# Patient Record
Sex: Male | Born: 1965
Health system: Southern US, Community
[De-identification: ages and names within clinical notes are randomized; demographics above are authoritative.]

## PROBLEM LIST (undated history)

## (undated) DIAGNOSIS — Z7960 Long term (current) use of unspecified immunomodulators and immunosuppressants: Secondary | ICD-10-CM

## (undated) DIAGNOSIS — Z9889 Other specified postprocedural states: Secondary | ICD-10-CM

## (undated) DIAGNOSIS — E785 Hyperlipidemia, unspecified: Secondary | ICD-10-CM

## (undated) DIAGNOSIS — I2510 Atherosclerotic heart disease of native coronary artery without angina pectoris: Secondary | ICD-10-CM

## (undated) DIAGNOSIS — K219 Gastro-esophageal reflux disease without esophagitis: Secondary | ICD-10-CM

## (undated) DIAGNOSIS — R112 Nausea with vomiting, unspecified: Secondary | ICD-10-CM

## (undated) DIAGNOSIS — M810 Age-related osteoporosis without current pathological fracture: Secondary | ICD-10-CM

## (undated) DIAGNOSIS — D4989 Neoplasm of unspecified behavior of other specified sites: Secondary | ICD-10-CM

## (undated) DIAGNOSIS — D150 Benign neoplasm of thymus: Secondary | ICD-10-CM

## (undated) DIAGNOSIS — J324 Chronic pansinusitis: Secondary | ICD-10-CM

## (undated) DIAGNOSIS — Z79899 Other long term (current) drug therapy: Secondary | ICD-10-CM

## (undated) DIAGNOSIS — G7000 Myasthenia gravis without (acute) exacerbation: Secondary | ICD-10-CM

## (undated) DIAGNOSIS — G2581 Restless legs syndrome: Secondary | ICD-10-CM

## (undated) DIAGNOSIS — B009 Herpesviral infection, unspecified: Secondary | ICD-10-CM

## (undated) HISTORY — DX: Restless legs syndrome: G25.81

## (undated) HISTORY — DX: Neoplasm of unspecified behavior of other specified sites: D49.89

## (undated) HISTORY — DX: Myasthenia gravis without (acute) exacerbation: G70.00

## (undated) HISTORY — DX: Benign neoplasm of thymus: D15.0

## (undated) HISTORY — DX: Herpesviral infection, unspecified: B00.9

---

## 1993-07-29 DIAGNOSIS — Z9889 Other specified postprocedural states: Secondary | ICD-10-CM | POA: Insufficient documentation

## 1993-10-04 HISTORY — PX: TOTAL THYMECTOMY: SHX2546

## 2001-10-04 DIAGNOSIS — B009 Herpesviral infection, unspecified: Secondary | ICD-10-CM

## 2001-10-04 HISTORY — DX: Herpesviral infection, unspecified: B00.9

## 2004-04-01 ENCOUNTER — Emergency Department (HOSPITAL_COMMUNITY): Admission: EM | Admit: 2004-04-01 | Discharge: 2004-04-01 | Payer: Self-pay | Admitting: Emergency Medicine

## 2004-10-04 HISTORY — PX: STRABISMUS SURGERY: SHX218

## 2005-01-08 ENCOUNTER — Ambulatory Visit: Payer: Self-pay | Admitting: Physician Assistant

## 2006-02-03 ENCOUNTER — Ambulatory Visit: Payer: Self-pay | Admitting: Specialist

## 2006-10-04 HISTORY — PX: EYE MUSCLE SURGERY: SHX370

## 2010-07-29 ENCOUNTER — Ambulatory Visit: Payer: Self-pay | Admitting: Unknown Physician Specialty

## 2011-08-20 ENCOUNTER — Ambulatory Visit: Payer: Self-pay | Admitting: Psychiatry

## 2011-11-29 ENCOUNTER — Ambulatory Visit: Payer: Self-pay | Admitting: Unknown Physician Specialty

## 2011-12-03 HISTORY — PX: SEPTOPLASTY: SUR1290

## 2011-12-07 ENCOUNTER — Ambulatory Visit: Payer: Self-pay | Admitting: Unknown Physician Specialty

## 2012-01-18 LAB — PULMONARY FUNCTION TEST

## 2012-01-19 ENCOUNTER — Ambulatory Visit: Payer: Self-pay | Admitting: Oncology

## 2012-01-19 ENCOUNTER — Ambulatory Visit: Payer: Self-pay | Admitting: Internal Medicine

## 2012-01-19 LAB — CBC CANCER CENTER
Bands: 4 %
Basophil #: 0 x10 3/mm (ref 0.0–0.1)
Basophil %: 0.5 %
Eosinophil #: 0 x10 3/mm (ref 0.0–0.7)
Eosinophil %: 0.4 %
Eosinophil: 1 %
HCT: 43.9 % (ref 40.0–52.0)
HGB: 13.9 g/dL (ref 13.0–18.0)
Lymphocyte #: 0.7 x10 3/mm — ABNORMAL LOW (ref 1.0–3.6)
Lymphocyte %: 7.3 %
Lymphocytes: 7 %
MCH: 26.6 pg (ref 26.0–34.0)
MCHC: 31.6 g/dL — ABNORMAL LOW (ref 32.0–36.0)
MCV: 84 fL (ref 80–100)
Monocyte #: 0.3 x10 3/mm (ref 0.2–1.0)
Monocyte %: 3.6 %
Monocytes: 3 %
Neutrophil #: 7.9 x10 3/mm — ABNORMAL HIGH (ref 1.4–6.5)
Neutrophil %: 88.2 %
Platelet: 356 x10 3/mm (ref 150–440)
RBC: 5.21 10*6/uL (ref 4.40–5.90)
RDW: 13.8 % (ref 11.5–14.5)
Segmented Neutrophils: 85 %
WBC: 9 x10 3/mm (ref 3.8–10.6)

## 2012-01-19 LAB — IRON AND TIBC
Iron Bind.Cap.(Total): 327 ug/dL (ref 250–450)
Iron Saturation: 21 %
Iron: 70 ug/dL (ref 65–175)
Unbound Iron-Bind.Cap.: 257 ug/dL

## 2012-01-19 LAB — HEPATIC FUNCTION PANEL A (ARMC)
Albumin: 3.7 g/dL (ref 3.4–5.0)
Alkaline Phosphatase: 69 U/L (ref 50–136)
Bilirubin, Direct: <0.1 mg/dL (ref 0.00–0.20)
Bilirubin,Total: 0.3 mg/dL (ref 0.2–1.0)
SGOT(AST): 20 U/L (ref 15–37)
SGPT (ALT): 24 U/L
Total Protein: 7.1 g/dL (ref 6.4–8.2)

## 2012-01-19 LAB — FOLATE: Folic Acid: 20.7 ng/mL (ref 3.1–100.0)

## 2012-01-19 LAB — CREATININE, SERUM
Creatinine: 0.84 mg/dL (ref 0.60–1.30)
EGFR (African American): >60
EGFR (Non-African Amer.): >60

## 2012-01-19 LAB — SEDIMENTATION RATE: Erythrocyte Sed Rate: 5 mm/hr (ref 0–15)

## 2012-01-19 LAB — LACTATE DEHYDROGENASE: LDH: 145 U/L (ref 87–241)

## 2012-02-02 ENCOUNTER — Ambulatory Visit: Payer: Self-pay | Admitting: Internal Medicine

## 2012-02-02 ENCOUNTER — Ambulatory Visit: Payer: Self-pay | Admitting: Oncology

## 2012-03-04 ENCOUNTER — Ambulatory Visit: Payer: Self-pay | Admitting: Internal Medicine

## 2012-03-04 ENCOUNTER — Ambulatory Visit: Payer: Self-pay | Admitting: Oncology

## 2012-04-04 ENCOUNTER — Ambulatory Visit: Payer: Self-pay | Admitting: Unknown Physician Specialty

## 2012-04-10 ENCOUNTER — Ambulatory Visit: Payer: Self-pay | Admitting: Internal Medicine

## 2012-04-26 ENCOUNTER — Ambulatory Visit (INDEPENDENT_AMBULATORY_CARE_PROVIDER_SITE_OTHER): Payer: 59 | Admitting: Internal Medicine

## 2012-04-26 ENCOUNTER — Encounter: Payer: Self-pay | Admitting: Internal Medicine

## 2012-04-26 VITALS — BP 102/64 | HR 70 | Temp 98.4°F | Resp 16 | Ht 66.0 in | Wt 148.2 lb

## 2012-04-26 DIAGNOSIS — G7000 Myasthenia gravis without (acute) exacerbation: Secondary | ICD-10-CM | POA: Insufficient documentation

## 2012-04-26 DIAGNOSIS — R059 Cough, unspecified: Secondary | ICD-10-CM

## 2012-04-26 DIAGNOSIS — R05 Cough: Secondary | ICD-10-CM

## 2012-04-26 DIAGNOSIS — R058 Other specified cough: Secondary | ICD-10-CM

## 2012-04-26 DIAGNOSIS — D4989 Neoplasm of unspecified behavior of other specified sites: Secondary | ICD-10-CM | POA: Insufficient documentation

## 2012-04-26 DIAGNOSIS — G2581 Restless legs syndrome: Secondary | ICD-10-CM | POA: Insufficient documentation

## 2012-04-26 MED ORDER — IPRATROPIUM BROMIDE 0.06 % NA SOLN: 2.0000 | Freq: Four times a day (QID) | NASAL | Status: DC

## 2012-04-26 NOTE — Progress Notes (Signed)
Patient ID: Frank Cabrera, male   DOB: 06-08-1966, 46 y.o.   MRN: [id number]  Patient Active Problem List  Diagnosis  . Cough with sputum    Subjective:  CC:   Chief Complaint  Patient presents with  . New Patient    HPI:   Frank Cabrera is a 46 y.o. male who presents as a new patient to establish primary care with the chief complaint of Cough for  One year.  Work in injection molding ,  Does not wear a mask.  No prior tobacco history , no travel .  Travel to [place] but mostly cities , last trip 2 years.  Ha shad chest x ray by Dr. Meredeth Ide and PFTS 2 months ago but no CT scan.  PFTS were reportedly normal except for  one element was borderline low. Cough is worse late at night and early in the morning. Throat makes a gurgling sound .   Fleming started Advair ,  Noticed a slight improvement for 2 weeks,  But has returned and feels he is wheezing all the time.  Varies in intensity worse when he lies down . taking omeprazole  Twice daily  20 am 40 q PM,  The heartburn is better but the coughing is still present.  ENT.  March  eval did not see reflux Erline Hau)  No change despite deviated septum surgery for sinus infections, occurring twice yearly .  Allergy testing was done Mirna Mires was negative .  Skin tests were negative bc he is histamine response did not react appropriately.  Was not on an anthistamine at the time .  History of myasthenia gravis,  Diagnosed 20 yrs ago Managed with prednisone and mycophenolate  15 mg daily,  Dr. Clarisa Kindred at [facility].  pramiprexole for RLS,  UGI/Barium swallow orderdd by Mechele Collin was positive for reflux, has not had any followup since.  Has not had a  hypersensitivity profile or a sputum culture  Past Medical History  Diagnosis Date  . Myasthenia gravis associated with thymoma   . Restless leg syndrome     Past Surgical History  Procedure Date  . Total thymectomy 1995  . Eye muscle surgery 2008    for strabismus, [facility],  Board  . Septoplasty March 2013     History reviewed. No pertinent family history.  History   Social History  . Marital Status: Married    Spouse Name: N/A    Number of Children: N/A  . Years of Education: N/A   Occupational History  . Not on file.   Social History Main Topics  . Smoking status: Never Smoker   . Smokeless tobacco: Never Used  . Alcohol Use: No  . Drug Use: No  . Sexually Active: Not on file   Other Topics Concern  . Not on file   Social History Narrative  . No narrative on file         @ALLHX @    Review of Systems:   The remainder of the review of systems was negative except those addressed in the HPI.       Objective:  BP 102/64  Pulse 70  Temp 98.4 F (36.9 C) (Oral)  Resp 16  Ht 5\' 6"  (1.676 m)  Wt 148 lb 4 oz (67.246 kg)  BMI 23.93 kg/m2  SpO2 96%  General appearance: alert, cooperative and appears stated age Ears: normal TM's and external ear canals both ears Throat: lips, mucosa, and tongue normal; teeth and gums normal Neck:  no adenopathy, no carotid bruit, supple, symmetrical, trachea midline and thyroid not enlarged, symmetric, no tenderness/mass/nodules Back: symmetric, no curvature. ROM normal. No CVA tenderness. Lungs: clear to auscultation bilaterally Heart: regular rate and rhythm, S1, S2 normal, no murmur, click, rub or gallop Abdomen: soft, non-tender; bowel sounds normal; no masses,  no organomegaly Pulses: 2+ and symmetric Skin: Skin color, texture, turgor normal. No rashes or lesions Lymph nodes: Cervical, supraclavicular, and axillary nodes normal.  Assessment and Plan:  Cough with sputum Etiology unclear and may be multifactorial including PND, acid reflux, eiosinophilic esophagitis, laryngela penetration from MG, and ILD from environmental exposure. .  Trial of Atrovent spray, obtain old records., consider hi rest CT , hypersensitivity profile, sputum culture, and swallow eval.   Updated Medication List Outpatient Encounter  Prescriptions as of 04/26/2012  Medication Sig Dispense Refill  . calcium carbonate (OS-CAL) 600 MG TABS Take 300 mg by mouth daily.      . ferrous gluconate (FERGON) 324 MG tablet Take 324 mg by mouth 3 (three) times daily with meals.      . Flaxseed, Linseed, (FLAXSEED OIL) 1000 MG CAPS Take by mouth daily.      . fluticasone-salmeterol (ADVAIR HFA) 115-21 MCG/ACT inhaler Inhale 2 puffs into the lungs 2 (two) times daily.      . folic acid (FOLVITE) 1 MG tablet Take 1 mg by mouth daily.      Marland Kitchen ipratropium (ATROVENT) 0.06 % nasal spray Place 2 sprays into the nose 4 (four) times daily.  15 mL  12  . methotrexate (RHEUMATREX) 15 MG tablet Take 15 mg by mouth once a week. Caution: Chemotherapy. Protect from light.      . Misc Natural Products (OSTEO BI-FLEX JOINT SHIELD PO) Take by mouth.      . Multiple Vitamin (MULTIVITAMIN) tablet Take 1 tablet by mouth daily.      . mycophenolate (CELLCEPT) 500 MG tablet Take 0.5 g by mouth 2 (two) times daily.      Marland Kitchen omeprazole (PRILOSEC) 20 MG capsule Take 20 mg by mouth daily.      . Pramipexole Dihydrochloride 0.375 MG TB24 Take by mouth at bedtime.      . predniSONE (DELTASONE) 10 MG tablet Take 15 mg by mouth daily.      . Turmeric 500 MG CAPS Take by mouth.         No orders of the defined types were placed in this encounter.    No Follow-up on file.

## 2012-04-26 NOTE — Assessment & Plan Note (Signed)
Etiology unclear and may be multifactorial including PND, acid reflux, eiosinophilic esophagitis, laryngela penetration from MG, and ILD from environmental exposure. .  Trial of Atrovent spray, obtain old records., consider hi rest CT , hypersensitivity profile, sputum culture, and swallow eval.

## 2012-05-05 ENCOUNTER — Encounter: Payer: Self-pay | Admitting: Internal Medicine

## 2012-05-30 ENCOUNTER — Telehealth: Payer: Self-pay | Admitting: Internal Medicine

## 2012-05-30 NOTE — Telephone Encounter (Signed)
Caller: [name]/Patient; Patient Name: Frank Cabrera; PCP: Duncan Dull (Adults only); Best Callback Phone Number: [phone]; Call regarding Fever, Nausea, Fatigue, Gassy, onset 8-24.  Temp ranging 99 to 100.  Advil taken prior to call.  All emergent symptoms ruled out per Fatigue Protocol, see wthin 24 hours. Appointment scheduled on 8-28 at 11:30 due to presistent fatigue that came on suddenly without known cause.  Patient verbalized understanding.

## 2012-05-31 ENCOUNTER — Ambulatory Visit (INDEPENDENT_AMBULATORY_CARE_PROVIDER_SITE_OTHER)
Admission: RE | Admit: 2012-05-31 | Discharge: 2012-05-31 | Disposition: A | Discharge status: Home / Self Care | Payer: 59 | Source: Ambulatory Visit | Attending: Internal Medicine | Admitting: Internal Medicine

## 2012-05-31 ENCOUNTER — Ambulatory Visit (INDEPENDENT_AMBULATORY_CARE_PROVIDER_SITE_OTHER): Payer: 59 | Admitting: Internal Medicine

## 2012-05-31 ENCOUNTER — Encounter: Payer: Self-pay | Admitting: Internal Medicine

## 2012-05-31 VITALS — BP 94/66 | HR 82 | Temp 98.6°F | Resp 14 | Wt 147.2 lb

## 2012-05-31 DIAGNOSIS — H9012 Conductive hearing loss, unilateral, left ear, with unrestricted hearing on the contralateral side: Secondary | ICD-10-CM

## 2012-05-31 DIAGNOSIS — H902 Conductive hearing loss, unspecified: Secondary | ICD-10-CM

## 2012-05-31 DIAGNOSIS — R059 Cough, unspecified: Secondary | ICD-10-CM

## 2012-05-31 DIAGNOSIS — J328 Other chronic sinusitis: Secondary | ICD-10-CM

## 2012-05-31 DIAGNOSIS — R058 Other specified cough: Secondary | ICD-10-CM

## 2012-05-31 DIAGNOSIS — R509 Fever, unspecified: Secondary | ICD-10-CM

## 2012-05-31 DIAGNOSIS — K219 Gastro-esophageal reflux disease without esophagitis: Secondary | ICD-10-CM

## 2012-05-31 DIAGNOSIS — IMO0001 Reserved for inherently not codable concepts without codable children: Secondary | ICD-10-CM

## 2012-05-31 DIAGNOSIS — J324 Chronic pansinusitis: Secondary | ICD-10-CM

## 2012-05-31 DIAGNOSIS — R053 Chronic cough: Secondary | ICD-10-CM

## 2012-05-31 DIAGNOSIS — R05 Cough: Secondary | ICD-10-CM

## 2012-05-31 DIAGNOSIS — R091 Pleurisy: Secondary | ICD-10-CM

## 2012-05-31 DIAGNOSIS — J929 Pleural plaque without asbestos: Secondary | ICD-10-CM

## 2012-05-31 LAB — CBC WITH DIFFERENTIAL/PLATELET
Basophils Absolute: 0 K/uL (ref 0.0–0.1)
Basophils Relative: 0.3 % (ref 0.0–3.0)
Eosinophils Absolute: 0 K/uL (ref 0.0–0.7)
Eosinophils Relative: 0.4 % (ref 0.0–5.0)
HCT: 43.9 % (ref 39.0–52.0)
Hemoglobin: 14.4 g/dL (ref 13.0–17.0)
Lymphocytes Relative: 6.9 % — ABNORMAL LOW (ref 12.0–46.0)
Lymphs Abs: 0.7 K/uL (ref 0.7–4.0)
MCHC: 32.8 g/dL (ref 30.0–36.0)
MCV: 85.2 fl (ref 78.0–100.0)
Monocytes Absolute: 0.4 K/uL (ref 0.1–1.0)
Monocytes Relative: 4.3 % (ref 3.0–12.0)
Neutro Abs: 8.6 K/uL — ABNORMAL HIGH (ref 1.4–7.7)
Neutrophils Relative %: 88.1 % — ABNORMAL HIGH (ref 43.0–77.0)
Platelets: 310 K/uL (ref 150.0–400.0)
RBC: 5.16 Mil/uL (ref 4.22–5.81)
RDW: 14.3 % (ref 11.5–14.6)
WBC: 9.8 K/uL (ref 4.5–10.5)

## 2012-05-31 NOTE — Progress Notes (Signed)
Patient ID: Frank Cabrera, male   DOB: [birth year], 46 y.o.   MRN: [id number]   Patient Active Problem List  Diagnosis  . Cough with sputum  . Myasthenia gravis associated with thymoma  . Restless leg syndrome  . Chronic cough  . Reflux  . Pansinusitis  . Conductive hearing loss in left ear  . Herpes simplex type II infection    Subjective:  CC:   Chief Complaint  Patient presents with  . Fever  . Nausea    HPI:   Frank Stehlin Smithis a 46 y.o. male who presents  Past Medical History  Diagnosis Date  . Myasthenia gravis associated with thymoma   . Restless leg syndrome   . Herpes simplex type II infection 2003    Past Surgical History  Procedure Date  . Total thymectomy 1995  . Eye muscle surgery 2008    for strabismus, [facility],  Board  . Septoplasty March 2013  . Strabismus surgery 2006         The following portions of the patient's history were reviewed and updated as appropriate: Allergies, current medications, and problem list.    Review of Systems:   12 Pt  review of systems was negative except those addressed in the HPI,     History   Social History  . Marital Status: Married    Spouse Name: N/A    Number of Children: N/A  . Years of Education: N/A   Occupational History  . Not on file.   Social History Main Topics  . Smoking status: Never Smoker   . Smokeless tobacco: Never Used  . Alcohol Use: No  . Drug Use: No  . Sexually Active: Not on file   Other Topics Concern  . Not on file   Social History Narrative  . No narrative on file    Objective:  BP 94/66  Pulse 82  Temp 98.6 F (37 C) (Oral)  Resp 14  Wt 147 lb 4 oz (66.792 kg)  SpO2 95%  General appearance: alert, cooperative and appears stated age Ears: normal TM's and external ear canals both ears Throat: lips, mucosa, and tongue normal; teeth and gums normal Neck: no adenopathy, no carotid bruit, supple, symmetrical, trachea midline and thyroid not enlarged,  symmetric, no tenderness/mass/nodules Back: symmetric, no curvature. ROM normal. No CVA tenderness. Lungs:  Scattered ronchi in all lung fields. No wheezing Heart: regular rate and rhythm, S1, S2 normal, no murmur, click, rub or gallop Abdomen: soft, non-tender; bowel sounds normal; no masses,  no organomegaly Pulses: 2+ and symmetric Skin: Skin color, texture, turgor normal. No rashes or lesions Lymph nodes: Cervical, supraclavicular, and axillary nodes normal.  Assessment and Plan:  Cough with sputum His current cough is concerning for an infectious etiology given his recent trip to [place]. I have ordered a chest x-ray and a CBC. His CBC had a normal white count but did have a left shift. His chest x-ray was normal except for pleural apical thickening bilaterally. Given his low-grade fevers and (temp of 100.0) and nonpurulent sputum I have elected to avoid antibiotics as such he has been on several courses of antibiotics over the last several months and I do not want to increase his risk for C. difficile. However I have ordered a chest CT with contrast to further evaluate his pleural apical thickening.  Chronic cough He has had a multidisciplinary approach to his chronic cough including GI,  ENT,  and pulmonary evaluations. He had a normal  laryngoscopy in August 2011. He had symptoms of reflux which is improved with Nexium in 2011. He had pansinusitis by CT scan in December 2012. He has a history of myasthenia gravis and is status post thymectomy  in 1995 and deviated septum surgery in March 2013 with minimal improvement in symptoms . He is taking omeprazole 40 mg twice daily for presumed reflux. He was given Advair and albuterol  by Dr. Meredeth Ide but has stopped the Advair as there was no difference in his symptoms.  in July Reglan was added along with Mucinex. He has had pulmonary function tests which noted mild obstruction and is scheduled to see Dr. Meredeth Ide at the end of the month for repeat  testing.  Reflux By report status post GI evaluation by Dr. Mechele Collin. Continue omeprazole 40 mg twice a day a  Pansinusitis Treated by [facility] within the buttocks followed by deviated septum surgery in March 2013. Persistent symptoms have necessitated repeat antibiotics with clindamycin for an additional 3 weeks in may , myringotomy and tympanostomy on the left in June and allergy testing in July which was apparently inconclusive.   Conductive hearing loss in left ear Confirmed with audiometrydone  May 2013, secondary to chronic serous otitis media which has now resolved with myringotomy and tympanostomy.   Updated Medication List Outpatient Encounter Prescriptions as of 05/31/2012  Medication Sig Dispense Refill  . calcium carbonate (OS-CAL) 600 MG TABS Take 300 mg by mouth daily.      . ferrous gluconate (FERGON) 324 MG tablet Take 324 mg by mouth 3 (three) times daily with meals.      . Flaxseed, Linseed, (FLAXSEED OIL) 1000 MG CAPS Take by mouth daily.      . fluticasone-salmeterol (ADVAIR HFA) 115-21 MCG/ACT inhaler Inhale 2 puffs into the lungs 2 (two) times daily.      . folic acid (FOLVITE) 1 MG tablet Take 1 mg by mouth daily.      Marland Kitchen ipratropium (ATROVENT) 0.06 % nasal spray Place 2 sprays into the nose 4 (four) times daily.  15 mL  12  . methotrexate (RHEUMATREX) 15 MG tablet Take 20 mg by mouth once a week. Caution: Chemotherapy. Protect from light.      . Misc Natural Products (OSTEO BI-FLEX JOINT SHIELD PO) Take by mouth.      . Multiple Vitamin (MULTIVITAMIN) tablet Take 1 tablet by mouth daily.      Marland Kitchen omeprazole (PRILOSEC) 20 MG capsule Take 20 mg by mouth daily.      . Pramipexole Dihydrochloride 0.375 MG TB24 Take by mouth at bedtime.      . predniSONE (DELTASONE) 10 MG tablet Take 15 mg by mouth daily.      . Turmeric 500 MG CAPS Take by mouth.      . DISCONTD: mycophenolate (CELLCEPT) 500 MG tablet Take 0.5 g by mouth 2 (two) times daily.         Orders Placed This  Encounter  Procedures  . DG Chest 2 View  . CT Chest W Contrast  . CBC with Differential    No Follow-up on file.

## 2012-06-01 ENCOUNTER — Encounter: Payer: Self-pay | Admitting: Internal Medicine

## 2012-06-01 DIAGNOSIS — J324 Chronic pansinusitis: Secondary | ICD-10-CM | POA: Insufficient documentation

## 2012-06-01 DIAGNOSIS — H9012 Conductive hearing loss, unilateral, left ear, with unrestricted hearing on the contralateral side: Secondary | ICD-10-CM | POA: Insufficient documentation

## 2012-06-01 DIAGNOSIS — IMO0001 Reserved for inherently not codable concepts without codable children: Secondary | ICD-10-CM | POA: Insufficient documentation

## 2012-06-01 DIAGNOSIS — R053 Chronic cough: Secondary | ICD-10-CM | POA: Insufficient documentation

## 2012-06-01 DIAGNOSIS — R05 Cough: Secondary | ICD-10-CM | POA: Insufficient documentation

## 2012-06-01 DIAGNOSIS — B009 Herpesviral infection, unspecified: Secondary | ICD-10-CM | POA: Insufficient documentation

## 2012-06-01 NOTE — Assessment & Plan Note (Signed)
By report status post GI evaluation by Dr. Mechele Collin. Continue omeprazole 40 mg twice a day a

## 2012-06-01 NOTE — Assessment & Plan Note (Signed)
He has had a multidisciplinary approach to his chronic cough including GI,  ENT,  and pulmonary evaluations. He had a normal laryngoscopy in August 2011. He had symptoms of reflux which is improved with Nexium in 2011. He had pansinusitis by CT scan in December 2012. He has a history of myasthenia gravis and is status post thymectomy  in 1995 and deviated septum surgery in March 2013 with minimal improvement in symptoms . He is taking omeprazole 40 mg twice daily for presumed reflux. He was given Advair and albuterol  by Dr. Meredeth Ide but has stopped the Advair as there was no difference in his symptoms.  in July Reglan was added along with Mucinex. He has had pulmonary function tests which noted mild obstruction and is scheduled to see Dr. Meredeth Ide at the end of the month for repeat testing.

## 2012-06-01 NOTE — Assessment & Plan Note (Signed)
Treated by [facility] within the buttocks followed by deviated septum surgery in March 2013. Persistent symptoms have necessitated repeat antibiotics with clindamycin for an additional 3 weeks in may , myringotomy and tympanostomy on the left in June and allergy testing in July which was apparently inconclusive.

## 2012-06-01 NOTE — Assessment & Plan Note (Signed)
His current cough is concerning for an infectious etiology given his recent trip to [place]. I have ordered a chest x-ray and a CBC. His CBC had a normal white count but did have a left shift. His chest x-ray was normal except for pleural apical thickening bilaterally. Given his low-grade fevers and (temp of 100.0) and nonpurulent sputum I have elected to avoid antibiotics as such he has been on several courses of antibiotics over the last several months and I do not want to increase his risk for C. difficile. However I have ordered a chest CT with contrast to further evaluate his pleural apical thickening.

## 2012-06-01 NOTE — Assessment & Plan Note (Signed)
Confirmed with audiometrydone  May 2013, secondary to chronic serous otitis media which has now resolved with myringotomy and tympanostomy.

## 2012-06-07 ENCOUNTER — Encounter: Payer: Self-pay | Admitting: Internal Medicine

## 2012-06-14 ENCOUNTER — Ambulatory Visit: Payer: Self-pay | Admitting: Internal Medicine

## 2012-06-14 ENCOUNTER — Telehealth: Payer: Self-pay | Admitting: Internal Medicine

## 2012-06-14 DIAGNOSIS — R059 Cough, unspecified: Secondary | ICD-10-CM

## 2012-06-14 DIAGNOSIS — R05 Cough: Secondary | ICD-10-CM

## 2012-06-14 NOTE — Telephone Encounter (Signed)
Angel at [facility] called and stated the patient has a contraindication with IV contrast dye, so the radiologist wants the CT done without contrast.  She needs a new order and a call to reschedule the appt for the same time just without contrast.  Please advise.

## 2012-06-14 NOTE — Telephone Encounter (Signed)
Ct reordered on and p[rinter

## 2012-06-15 ENCOUNTER — Telehealth: Payer: Self-pay

## 2012-06-15 ENCOUNTER — Telehealth: Payer: Self-pay | Admitting: Internal Medicine

## 2012-06-15 NOTE — Telephone Encounter (Signed)
Frank Cabrera at Dr Verdie Shire' office left v/m requesting copy of CXR done on 05/31/12 faxed to Dr Meredeth Ide fax # [phone]. Pt cancelled CXR ordered by Dr Meredeth Ide.Please advise.

## 2012-06-16 ENCOUNTER — Encounter: Payer: Self-pay | Admitting: Internal Medicine

## 2012-06-16 DIAGNOSIS — R05 Cough: Secondary | ICD-10-CM

## 2012-06-16 DIAGNOSIS — R059 Cough, unspecified: Secondary | ICD-10-CM

## 2012-06-16 NOTE — Telephone Encounter (Signed)
Left message on machine for [facility] at [facility]

## 2012-06-16 NOTE — Telephone Encounter (Signed)
The X ray was done at [facility] , so we will need to request that a copy of the actual filmbe sent to Dr. Meredeth Ide .  Please start that process.

## 2012-06-23 ENCOUNTER — Encounter: Payer: Self-pay | Admitting: Internal Medicine

## 2012-07-10 DIAGNOSIS — K219 Gastro-esophageal reflux disease without esophagitis: Secondary | ICD-10-CM | POA: Insufficient documentation

## 2012-08-01 ENCOUNTER — Ambulatory Visit (INDEPENDENT_AMBULATORY_CARE_PROVIDER_SITE_OTHER): Payer: 59 | Admitting: Pulmonary Disease

## 2012-08-01 ENCOUNTER — Encounter: Payer: Self-pay | Admitting: Pulmonary Disease

## 2012-08-01 VITALS — BP 90/60 | HR 83 | Temp 98.3°F | Ht 66.0 in | Wt 152.0 lb

## 2012-08-01 DIAGNOSIS — R053 Chronic cough: Secondary | ICD-10-CM

## 2012-08-01 DIAGNOSIS — K219 Gastro-esophageal reflux disease without esophagitis: Secondary | ICD-10-CM

## 2012-08-01 DIAGNOSIS — R0602 Shortness of breath: Secondary | ICD-10-CM

## 2012-08-01 DIAGNOSIS — IMO0001 Reserved for inherently not codable concepts without codable children: Secondary | ICD-10-CM

## 2012-08-01 DIAGNOSIS — J45909 Unspecified asthma, uncomplicated: Secondary | ICD-10-CM | POA: Insufficient documentation

## 2012-08-01 DIAGNOSIS — R05 Cough: Secondary | ICD-10-CM

## 2012-08-01 DIAGNOSIS — R059 Cough, unspecified: Secondary | ICD-10-CM

## 2012-08-01 MED ORDER — ALBUTEROL SULFATE HFA 108 (90 BASE) MCG/ACT IN AERS: 2.0000 | INHALATION_SPRAY | Freq: Four times a day (QID) | RESPIRATORY_TRACT | Status: DC | PRN

## 2012-08-01 MED ORDER — BECLOMETHASONE DIPROPIONATE 80 MCG/ACT IN AERS: 1.0000 | INHALATION_SPRAY | Freq: Two times a day (BID) | RESPIRATORY_TRACT | Status: DC

## 2012-08-01 NOTE — Assessment & Plan Note (Signed)
I think that this is likely secondary to his reflux and his asthma. See recommendations as above.

## 2012-08-01 NOTE — Assessment & Plan Note (Addendum)
Frank Cabrera has fairly significant symptoms and by my review of his CT scan I think he likely has chronic aspiration into the right lower lobe.  Plan: -Follow a gastroesophageal reflux disease diet recommendations -Start omeprazole daily -If no improvement in symptoms then we'll refer back to Dr. Mechele Collin for a pH probe placement.

## 2012-08-01 NOTE — Assessment & Plan Note (Addendum)
Based on Frank Cabrera's history of intermittent shortness of breath, cough and wheezing I think that asthma is likely. He states that once during childhood he had an episode of shortness of breath and wheezing and he has noticed it more frequently in the last several years. About 1 in for adult males with asthma will develop the disease and adulthood so though albeit a little bit unusual I think that this is likely asthma. His airway obstruction on pulmonary function testing supports this.  I think that it is also likely that his gastroesophageal reflux disease is contributing. Frank Cabrera has a very interesting CT of his chest which shows a focal area of emphysema in the right lower lung with associated tree in bud opacities. This seems consistent with chronic aspiration which certainly is going to make his asthma worse. He recently saw a gastroenterologist who recommended placing a 24-hour pH probe which I think is reasonable if not performing esophageal impedance testing.  Interestingly Frank Cabrera's symptoms have improved since he started on methotrexate. Though not indicated per current asthma care guidelines, methotrexate was once used by some providers for severe refractory asthma.  Plan: -Start Qvar 80 mcg 1 puff twice a day -Follow gastroesophageal reflux diet closely -Start PPI daily (he has omeprazole at home which I've encouraged him to continue taking) -If no improvement in intermittent cough and shortness of breath then would proceed with 24-hour pH probe testing and evaluation for a Nissen fundoplication if indicated -Followup with [place] in 3 months.

## 2012-08-01 NOTE — Patient Instructions (Addendum)
Stop your other inhalers Start QVar one puff twice a day Use the albuterol inhaler 2 puffs every four hours as needed for shortness of breath Follow the acid reflux diet recommendations we gave you Take your stomach pill every day  We will see you back in 2-3 months or sooner if needed

## 2012-08-01 NOTE — Progress Notes (Signed)
Subjective:    Patient ID: Frank Cabrera, male    DOB: 02/12/1966, 46 y.o.   MRN: [id number]  HPI  This is a 46 year old gentleman with myasthenia gravis who is referred to our clinic for a second opinion on asthma. As a child he says he remembers on at least one occasion having a severe episode of shortness of breath and wheezing with associated cough. He was told at that time that he had asthma but he never sought medical care for this. On various occasions over the years as an adult he has experienced intermittent episodes of shortness of breath, cough, wheezing, and chest tightness typically associated with exertion. This is worsens in the last 18 months during which time he is noticed recurrent and more severe episodes. He was given Advair and albuterol but is not sure if these actually helped. He was seen by Dr. Mayo Ao who felt that he had asthma exacerbated by gastroesophageal reflux disease and he was referred to gastroenterology for placement of a 24-hour pH probe. He has come to our clinic for a second opinion before this could be performed.  He says that the episodes of shortness of breath and wheezing have been minimal in the last several weeks. He is currently not taking any inhalers or using his acid reflux medications. He still does experience shortness of breath and wheezing with increased activity but he says that he thinks his weakness from myasthenia limits him more than the shortness of breath. These episodes are typically more frequent when he is working outside in the yard. He also describes a cough productive of sputum which was worse several weeks ago when he had increased sinus congestion and allergic rhinitis symptoms.  He works as a Equities trader and is not sure if he is exposed to any chemicals at work that could contribute to his condition.  He has frequent episodes of heartburn but surprisingly these have not been worse since stopping the omeprazole. He does note that  he often coughs more after eating.  He was diagnosed with myasthenia gravis at age 46 and has been treated with multiple medications over the years. Prednisone has been the most frequently used medication, and he was on CellCept for many years up until just a few months ago. About 3 months ago he was started on methotrexate.  He underwent allergy testing not too long ago and apparently both the RAST test and the skin prick test for both completely negative. Although he tells me that the positive control did not produce a welt.  Past Medical History  Diagnosis Date  . Myasthenia gravis associated with thymoma   . Restless leg syndrome   . Herpes simplex type II infection 2003     Family History  Problem Relation Age of Onset  . Heart disease Mother   . Heart disease Father      History   Social History  . Marital Status: Married    Spouse Name: N/A    Number of Children: N/A  . Years of Education: N/A   Occupational History  . Not on file.   Social History Main Topics  . Smoking status: Never Smoker   . Smokeless tobacco: Never Used  . Alcohol Use: No  . Drug Use: No  . Sexually Active: Not on file   Other Topics Concern  . Not on file   Social History Narrative  . No narrative on file     Allergies  Allergen Reactions  . Cefadroxil  Rash     Outpatient Prescriptions Prior to Visit  Medication Sig Dispense Refill  . calcium carbonate (OS-CAL) 600 MG TABS Take 300 mg by mouth daily.      . ferrous gluconate (FERGON) 324 MG tablet Take 324 mg by mouth 3 (three) times daily with meals.      . Flaxseed, Linseed, (FLAXSEED OIL) 1000 MG CAPS Take by mouth daily.      . folic acid (FOLVITE) 1 MG tablet Take 1 mg by mouth daily.      . methotrexate (RHEUMATREX) 15 MG tablet Take 20 mg by mouth once a week. Caution: Chemotherapy. Protect from light.      . Misc Natural Products (OSTEO BI-FLEX JOINT SHIELD PO) Take 1 tablet by mouth daily.       . Multiple Vitamin  (MULTIVITAMIN) tablet Take 1 tablet by mouth daily.      . Pramipexole Dihydrochloride 0.375 MG TB24 Take by mouth at bedtime.      . predniSONE (DELTASONE) 10 MG tablet Take 15 mg by mouth daily.      . Turmeric 500 MG CAPS Take 1 tablet by mouth 2 (two) times daily.       . fluticasone-salmeterol (ADVAIR HFA) 115-21 MCG/ACT inhaler Inhale 2 puffs into the lungs 2 (two) times daily.      Marland Kitchen ipratropium (ATROVENT) 0.06 % nasal spray Place 2 sprays into the nose 4 (four) times daily.  15 mL  12  . omeprazole (PRILOSEC) 20 MG capsule Take 20 mg by mouth daily.           Review of Systems  Constitutional: Negative for fever, chills, activity change and appetite change.  HENT: Positive for congestion and sinus pressure. Negative for hearing loss, ear pain, rhinorrhea, sneezing, neck pain, neck stiffness and postnasal drip.   Eyes: Negative for redness, itching and visual disturbance.  Respiratory: Positive for cough and wheezing. Negative for chest tightness and shortness of breath.   Cardiovascular: Negative for chest pain, palpitations and leg swelling.  Gastrointestinal: Positive for abdominal pain. Negative for nausea, vomiting, diarrhea, constipation, blood in stool and abdominal distention.  Musculoskeletal: Negative for myalgias, joint swelling, arthralgias and gait problem.  Skin: Negative for rash.  Neurological: Negative for dizziness, light-headedness, numbness and headaches.  Hematological: Does not bruise/bleed easily.  Psychiatric/Behavioral: Negative for confusion and dysphoric mood.       Objective:   Physical Exam Filed Vitals:   08/01/12 1531  BP: 90/60  Pulse: 83  Temp: 98.3 F (36.8 C)  TempSrc: Oral  Height: 5\' 6"  (1.676 m)  Weight: 152 lb (68.947 kg)  SpO2: 98%   Gen: well appearing, no acute distress HEENT: NCAT, PERRL, EOMi, OP clear, neck supple without masses PULM: Few scattered wheezes bilaterally CV: RRR, no mgr, no JVD AB: BS+, soft, nontender, no  hsm Ext: warm, no edema, no clubbing, no cyanosis; L wrist deformity Derm: no rash or skin breakdown Neuro: A&Ox4, CN II-XII intact, strength 5/5 in all 4 extremities  06/2012 CT Chest (my read) >> upper lobes normal in appearance, there is focal emphysema in the RML and RLL with tree-in-bud opacities  01/2012 Full PFT [facility] >> Ratio 66%, FEV1 77% predicted, TLC and DLCO normal     Assessment & Plan:   Asthma Based on Demontre's history of intermittent shortness of breath, cough and wheezing I think that asthma is likely. He states that once during childhood he had an episode of shortness of breath and wheezing and he  has noticed it more frequently in the last several years. About 1 in for adult males with asthma will develop the disease and adulthood so though albeit a little bit unusual I think that this is likely asthma. His airway obstruction on pulmonary function testing supports this.  I think that it is also likely that his gastroesophageal reflux disease is contributing. July has a very interesting CT of his chest which shows a focal area of emphysema in the right lower lung with associated tree in bud opacities. This seems consistent with chronic aspiration which certainly is going to make his asthma worse. He recently saw a gastroenterologist who recommended placing a 24-hour pH probe which I think is reasonable if not performing esophageal impedance testing.  Interestingly Camari's symptoms have improved since he started on methotrexate. Though not indicated per current asthma care guidelines, methotrexate was once used by some providers for severe refractory asthma.  Plan: -Start Qvar 80 mcg 1 puff twice a day -Follow gastroesophageal reflux diet closely -Start PPI daily (he has omeprazole at home which I've encouraged him to continue taking) -If no improvement in intermittent cough and shortness of breath then would proceed with 24-hour pH probe testing and evaluation for a Nissen  fundoplication if indicated -Followup with [place] in 3 months.  Reflux Coren has fairly significant symptoms and by my review of his CT scan I think he likely has chronic aspiration into the right lower lobe.  Plan: -Follow a gastroesophageal reflux disease diet recommendations -Start omeprazole daily -If no improvement in symptoms then we'll refer back to Dr. Mechele Collin for a pH probe placement.  Chronic cough I think that this is likely secondary to his reflux and his asthma. See recommendations as above.    Updated Medication List Outpatient Encounter Prescriptions as of 08/01/2012  Medication Sig Dispense Refill  . calcium carbonate (OS-CAL) 600 MG TABS Take 300 mg by mouth daily.      . ferrous gluconate (FERGON) 324 MG tablet Take 324 mg by mouth 3 (three) times daily with meals.      . Flaxseed, Linseed, (FLAXSEED OIL) 1000 MG CAPS Take by mouth daily.      . folic acid (FOLVITE) 1 MG tablet Take 1 mg by mouth daily.      . methotrexate (RHEUMATREX) 15 MG tablet Take 20 mg by mouth once a week. Caution: Chemotherapy. Protect from light.      . Misc Natural Products (OSTEO BI-FLEX JOINT SHIELD PO) Take 1 tablet by mouth daily.       . Multiple Vitamin (MULTIVITAMIN) tablet Take 1 tablet by mouth daily.      . Pramipexole Dihydrochloride 0.375 MG TB24 Take by mouth at bedtime.      . predniSONE (DELTASONE) 10 MG tablet Take 15 mg by mouth daily.      . Probiotic Product (ALIGN) 4 MG CAPS Take 1 capsule by mouth daily.      . Turmeric 500 MG CAPS Take 1 tablet by mouth 2 (two) times daily.       Marland Kitchen albuterol (PROVENTIL HFA;VENTOLIN HFA) 108 (90 BASE) MCG/ACT inhaler Inhale 2 puffs into the lungs every 6 (six) hours as needed for wheezing.  1 Inhaler  1  . beclomethasone (QVAR) 80 MCG/ACT inhaler Inhale 1 puff into the lungs 2 (two) times daily.  1 Inhaler  2  . DISCONTD: fluticasone-salmeterol (ADVAIR HFA) 115-21 MCG/ACT inhaler Inhale 2 puffs into the lungs 2 (two) times daily.      Marland Kitchen  DISCONTD: ipratropium (ATROVENT) 0.06 % nasal spray Place 2 sprays into the nose 4 (four) times daily.  15 mL  12  . DISCONTD: omeprazole (PRILOSEC) 20 MG capsule Take 20 mg by mouth daily.

## 2012-08-02 LAB — ALPHA-1-ANTITRYPSIN: A-1 Antitrypsin, Ser: 162 mg/dL (ref 90–200)

## 2012-08-03 LAB — ALPHA-1-ANTITRYPSIN PHENOTYP: A-1 Antitrypsin: 121 mg/dL (ref 90–200)

## 2012-08-07 ENCOUNTER — Telehealth: Payer: Self-pay | Admitting: *Deleted

## 2012-08-07 NOTE — Telephone Encounter (Signed)
LMTCB

## 2012-08-07 NOTE — Telephone Encounter (Signed)
Message copied by Christen Butter on Mon Aug 07, 2012  4:41 PM ------      Message from: Max Fickle B      Created: Mon Aug 07, 2012  9:03 AM       L,            Please let him know that his alpha one test was negative.            B

## 2012-08-08 NOTE — Telephone Encounter (Signed)
Spoke with pt and notified of results per Dr.McQuaid. Pt verbalized understanding and denied any questions. 

## 2012-08-30 ENCOUNTER — Ambulatory Visit: Payer: Self-pay | Admitting: Psychiatry

## 2012-09-02 LAB — HM DEXA SCAN

## 2012-10-12 ENCOUNTER — Telehealth: Payer: Self-pay | Admitting: Pulmonary Disease

## 2012-10-12 NOTE — Telephone Encounter (Signed)
10/06/12-Pt does not want to sched appt right now-states he is doing better & will call when an appt is needed.  Leanora Ivanoff

## 2012-10-18 ENCOUNTER — Ambulatory Visit (INDEPENDENT_AMBULATORY_CARE_PROVIDER_SITE_OTHER): Payer: 59 | Admitting: Internal Medicine

## 2012-10-18 ENCOUNTER — Encounter: Payer: Self-pay | Admitting: Internal Medicine

## 2012-10-18 VITALS — BP 108/74 | HR 68 | Temp 98.5°F | Resp 16 | Wt 160.5 lb

## 2012-10-18 DIAGNOSIS — M25522 Pain in left elbow: Secondary | ICD-10-CM

## 2012-10-18 DIAGNOSIS — R197 Diarrhea, unspecified: Secondary | ICD-10-CM

## 2012-10-18 DIAGNOSIS — M25529 Pain in unspecified elbow: Secondary | ICD-10-CM

## 2012-10-18 NOTE — Progress Notes (Signed)
Patient ID: Frank Cabrera, male   DOB: 1965/12/16, 47 y.o.   MRN: [id number]  Patient Active Problem List  Diagnosis  . Cough with sputum  . Myasthenia gravis associated with thymoma  . Restless leg syndrome  . Chronic cough  . Reflux  . Pansinusitis  . Conductive hearing loss in left ear  . Herpes simplex type II infection  . Shortness of breath  . Asthma  . Diarrhea  . Elbow pain, left    Subjective:  CC:   Chief Complaint  Patient presents with  . Stomach issues    HPI:   Frank Cabrera a 47 y.o. male who presents 1) Stomach gurgling and upset cramping pain with lots of gas and loose stool since August when he returned from [facility],  Drank bottle water or sterilized tap water.,  But did eat some unusual foods,  No camping   No weight loss.  No recent antibiotics.,  Has tried probiotics.,  Including Align, no improvement.  Now on ultimate 10 probiotic for the past week.  Having irregular stools,  3 days without any stool then will have  3 or 4 loose stools daily,.   Prior diagnosis of IBS.  Aggravated by eating a big meal , does not occur in the morning,  Occurs with lunch and dinner .  She has appt with Fransico Setters in one month   2) FH of colon CA in [place] and  Maternal uncle with prostate CA .  Not sure of the ages.   . No nocturia or trouble voiding   3) left elbow sore, intermittent,  never completely pain free for the past 3 months.  Positional.  Aggravated by direct pressure ,.  No history of trauma.  At times feel the skin is on fire, but the skin is never red or not hot to the touch     Past Medical History  Diagnosis Date  . Myasthenia gravis associated with thymoma   . Restless leg syndrome   . Herpes simplex type II infection 2003    Past Surgical History  Procedure Date  . Total thymectomy 1995  . Eye muscle surgery 2008    for strabismus, [facility],  Board  . Septoplasty March 2013  . Strabismus surgery 2006         The following portions of the  patient's history were reviewed and updated as appropriate: Allergies, current medications, and problem list.    Review of Systems:   12 Pt  review of systems was negative except those addressed in the HPI,     History   Social History  . Marital Status: Married    Spouse Name: N/A    Number of Children: N/A  . Years of Education: N/A   Occupational History  . Not on file.   Social History Main Topics  . Smoking status: Never Smoker   . Smokeless tobacco: Never Used  . Alcohol Use: No  . Drug Use: No  . Sexually Active: Not on file   Other Topics Concern  . Not on file   Social History Narrative  . No narrative on file    Objective:  BP 108/74  Pulse 68  Temp 98.5 F (36.9 C) (Oral)  Resp 16  Wt 160 lb 8 oz (72.802 kg)  SpO2 97%  General appearance: alert, cooperative and appears stated age Ears: normal TM's and external ear canals both ears Throat: lips, mucosa, and tongue normal; teeth and gums normal Neck: no  adenopathy, no carotid bruit, supple, symmetrical, trachea midline and thyroid not enlarged, symmetric, no tenderness/mass/nodules Back: symmetric, no curvature. ROM normal. No CVA tenderness. Lungs: clear to auscultation bilaterally Heart: regular rate and rhythm, S1, S2 normal, no murmur, click, rub or gallop Abdomen: soft, non-tender; bowel sounds normal; no masses,  no organomegaly Pulses: 2+ and symmetric Skin: Skin color, texture, turgor normal. No rashes or lesions Lymph nodes: Cervical, supraclavicular, and axillary nodes normal.  Assessment and Plan:  Diarrhea Intermittent symptoms without weight loss do not suggest malabsoption or ova and parasites from [place] trip but due to patients anxiety will screen for infectious causes and celiac disease. If all tests are normal will iniate trial of bentyl.   Elbow pain, left etiology unclear.  No effusion or bruising noted.  Trial of NSAID and ice     Updated Medication List Outpatient  Encounter Prescriptions as of 10/18/2012  Medication Sig Dispense Refill  . beclomethasone (QVAR) 80 MCG/ACT inhaler Inhale 1 puff into the lungs 2 (two) times daily.  1 Inhaler  2  . CALCIUM PO Take 300 mg by mouth daily before breakfast.      . ferrous gluconate (FERGON) 324 MG tablet Take 324 mg by mouth 3 (three) times daily with meals.      . Flaxseed, Linseed, (FLAXSEED OIL) 1000 MG CAPS Take by mouth daily.      . folic acid (FOLVITE) 1 MG tablet Take 1 mg by mouth daily.      . methotrexate (RHEUMATREX) 15 MG tablet Take 20 mg by mouth once a week. Caution: Chemotherapy. Protect from light.      . Misc Natural Products (OSTEO BI-FLEX JOINT SHIELD PO) Take 1 tablet by mouth daily.       . Multiple Vitamin (MULTIVITAMIN) tablet Take 1 tablet by mouth daily.      Marland Kitchen omeprazole (PRILOSEC) 20 MG capsule Take 20 mg by mouth 2 (two) times daily.      . Pramipexole Dihydrochloride 0.375 MG TB24 Take by mouth at bedtime.      . predniSONE (DELTASONE) 5 MG tablet Take 15 mg by mouth daily.      . Probiotic Product (PROBIOTIC DAILY PO) Take by mouth. Ultimate 10      . Turmeric 500 MG CAPS Take 1 tablet by mouth 2 (two) times daily.       . [DISCONTINUED] albuterol (PROVENTIL HFA;VENTOLIN HFA) 108 (90 BASE) MCG/ACT inhaler Inhale 2 puffs into the lungs every 6 (six) hours as needed for wheezing.  1 Inhaler  1  . [DISCONTINUED] predniSONE (DELTASONE) 10 MG tablet Take 15 mg by mouth daily.      Marland Kitchen dicyclomine (BENTYL) 20 MG tablet Take 1 tablet (20 mg total) by mouth every 6 (six) hours.  90 tablet  31  . [DISCONTINUED] calcium carbonate (OS-CAL) 600 MG TABS Take 300 mg by mouth daily.      . [DISCONTINUED] Probiotic Product (ALIGN) 4 MG CAPS Take 1 capsule by mouth daily.         Updated Medication List Outpatient Encounter Prescriptions as of 10/18/2012  Medication Sig Dispense Refill  . beclomethasone (QVAR) 80 MCG/ACT inhaler Inhale 1 puff into the lungs 2 (two) times daily.  1 Inhaler  2  .  CALCIUM PO Take 300 mg by mouth daily before breakfast.      . ferrous gluconate (FERGON) 324 MG tablet Take 324 mg by mouth 3 (three) times daily with meals.      . Flaxseed, Linseed, (  FLAXSEED OIL) 1000 MG CAPS Take by mouth daily.      . folic acid (FOLVITE) 1 MG tablet Take 1 mg by mouth daily.      . methotrexate (RHEUMATREX) 15 MG tablet Take 20 mg by mouth once a week. Caution: Chemotherapy. Protect from light.      . Misc Natural Products (OSTEO BI-FLEX JOINT SHIELD PO) Take 1 tablet by mouth daily.       . Multiple Vitamin (MULTIVITAMIN) tablet Take 1 tablet by mouth daily.      Marland Kitchen omeprazole (PRILOSEC) 20 MG capsule Take 20 mg by mouth 2 (two) times daily.      . Pramipexole Dihydrochloride 0.375 MG TB24 Take by mouth at bedtime.      . predniSONE (DELTASONE) 5 MG tablet Take 15 mg by mouth daily.      . Probiotic Product (PROBIOTIC DAILY PO) Take by mouth. Ultimate 10      . Turmeric 500 MG CAPS Take 1 tablet by mouth 2 (two) times daily.       . [DISCONTINUED] albuterol (PROVENTIL HFA;VENTOLIN HFA) 108 (90 BASE) MCG/ACT inhaler Inhale 2 puffs into the lungs every 6 (six) hours as needed for wheezing.  1 Inhaler  1  . [DISCONTINUED] predniSONE (DELTASONE) 10 MG tablet Take 15 mg by mouth daily.      Marland Kitchen dicyclomine (BENTYL) 20 MG tablet Take 1 tablet (20 mg total) by mouth every 6 (six) hours.  90 tablet  31  . [DISCONTINUED] calcium carbonate (OS-CAL) 600 MG TABS Take 300 mg by mouth daily.      . [DISCONTINUED] Probiotic Product (ALIGN) 4 MG CAPS Take 1 capsule by mouth daily.         Orders Placed This Encounter  Procedures  . Clostridium difficile EIA  . Ova and parasite screen  . Helicobacter pylori special antigen  . Comprehensive metabolic panel  . Magnesium  . Celiac Disease Ab Screen w/Rfx  . Sedimentation rate  . C-reactive protein  . Stool, WBC/Lactoferrin    No Follow-up on file.

## 2012-10-18 NOTE — Patient Instructions (Addendum)
  You can try Mylanta Gas for relief of bloating and gas, or Gas X  Beano and Lactase help with gas caused by poor digestion of beans (legumes) and mild products.  They should be taken with a meal containing beans or dairy  Avoid drinking sodas or other carbonated beverages with your meals.  Try keeping a food diary to correlate certain foods with symptoms If your stool studies are all normal,  I will call you in a medication for IBS to see if it helps your sympotms  Make a return appt in 2 to 3 weeks

## 2012-10-19 LAB — SEDIMENTATION RATE: Sed Rate: 6 mm/hr (ref 0–22)

## 2012-10-19 LAB — C-REACTIVE PROTEIN: CRP: <0.5 mg/dL (ref 0.5–20.0)

## 2012-10-19 LAB — MAGNESIUM: Magnesium: 2.3 mg/dL (ref 1.5–2.5)

## 2012-10-20 ENCOUNTER — Encounter: Payer: Self-pay | Admitting: Internal Medicine

## 2012-10-20 DIAGNOSIS — M25522 Pain in left elbow: Secondary | ICD-10-CM | POA: Insufficient documentation

## 2012-10-20 LAB — CELIAC DISEASE AB SCREEN W/RFX
Antigliadin Abs, IgA: 8 units (ref 0–19)
IgA/Immunoglobulin A, Serum: 234 mg/dL (ref 91–414)
Transglutaminase IgA: <2 U/mL (ref 0–3)

## 2012-10-20 MED ORDER — DICYCLOMINE HCL 20 MG PO TABS: 20.0000 mg | ORAL_TABLET | Freq: Four times a day (QID) | ORAL | Status: DC

## 2012-10-20 NOTE — Assessment & Plan Note (Signed)
Intermittent symptoms without weight loss do not suggest malabsoption or ova and parasties from [place] trip but due to patients anxiety will screen for infectious causes and celiac disease. If all tests are normal will iniate trial of bentyl.

## 2012-10-20 NOTE — Assessment & Plan Note (Signed)
wetiology unclear.  No effusion or bruising noted.  Trial fo NSAID and ice

## 2012-11-01 ENCOUNTER — Encounter: Payer: Self-pay | Admitting: Internal Medicine

## 2012-11-07 ENCOUNTER — Ambulatory Visit: Payer: 59 | Admitting: Internal Medicine

## 2012-11-21 ENCOUNTER — Ambulatory Visit: Payer: 59 | Admitting: Internal Medicine

## 2012-12-05 ENCOUNTER — Ambulatory Visit: Payer: 59 | Admitting: Internal Medicine

## 2012-12-12 ENCOUNTER — Ambulatory Visit: Payer: 59 | Admitting: Internal Medicine

## 2012-12-27 ENCOUNTER — Encounter: Payer: Self-pay | Admitting: Internal Medicine

## 2013-01-02 ENCOUNTER — Ambulatory Visit: Payer: 59 | Admitting: Internal Medicine

## 2013-01-06 ENCOUNTER — Other Ambulatory Visit: Payer: Self-pay | Admitting: Internal Medicine

## 2013-01-06 DIAGNOSIS — M810 Age-related osteoporosis without current pathological fracture: Secondary | ICD-10-CM

## 2013-01-08 ENCOUNTER — Encounter: Payer: Self-pay | Admitting: Emergency Medicine

## 2013-01-23 ENCOUNTER — Telehealth: Payer: Self-pay

## 2013-01-23 NOTE — Telephone Encounter (Signed)
Left message for patient about what him and Dr. Darrick Huntsman were discussing... Amber from the office will call you. Make sure Dr. Dimas Aguas sends his records to Dr. Renae Fickle including the bone density report because I do not have it

## 2013-03-12 ENCOUNTER — Other Ambulatory Visit: Payer: Self-pay | Admitting: Pulmonary Disease

## 2013-03-12 MED ORDER — BECLOMETHASONE DIPROPIONATE 80 MCG/ACT IN AERS: 1.0000 | INHALATION_SPRAY | Freq: Two times a day (BID) | RESPIRATORY_TRACT | Status: DC

## 2013-06-26 ENCOUNTER — Encounter: Payer: Self-pay | Admitting: Internal Medicine

## 2013-08-01 ENCOUNTER — Encounter: Payer: Self-pay | Admitting: Internal Medicine

## 2013-08-01 ENCOUNTER — Ambulatory Visit (INDEPENDENT_AMBULATORY_CARE_PROVIDER_SITE_OTHER): Payer: No Typology Code available for payment source | Admitting: Internal Medicine

## 2013-08-01 VITALS — BP 104/70 | HR 61 | Temp 98.3°F | Wt 169.0 lb

## 2013-08-01 DIAGNOSIS — IMO0002 Reserved for concepts with insufficient information to code with codable children: Secondary | ICD-10-CM

## 2013-08-01 DIAGNOSIS — T50905S Adverse effect of unspecified drugs, medicaments and biological substances, sequela: Secondary | ICD-10-CM

## 2013-08-01 DIAGNOSIS — R635 Abnormal weight gain: Secondary | ICD-10-CM

## 2013-08-01 DIAGNOSIS — M818 Other osteoporosis without current pathological fracture: Secondary | ICD-10-CM

## 2013-08-01 DIAGNOSIS — R29898 Other symptoms and signs involving the musculoskeletal system: Secondary | ICD-10-CM

## 2013-08-01 DIAGNOSIS — R638 Other symptoms and signs concerning food and fluid intake: Secondary | ICD-10-CM

## 2013-08-01 NOTE — Progress Notes (Signed)
Patient ID: Frank Cabrera, male   DOB: [birth year], 47 y.o.   MRN: [id number]   Patient Active Problem List   Diagnosis Date Noted  . Weight gain due to medication 08/02/2013  . 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 Axillary symptom 08/02/2013  . Elbow pain, left 10/20/2012  . Diarrhea 10/18/2012  . Shortness of breath 08/01/2012  . Asthma 08/01/2012  . Chronic cough 06/01/2012  . Reflux 06/01/2012  . Pansinusitis 06/01/2012  . Conductive hearing loss in left ear 06/01/2012  . Herpes simplex type II infection   . Myasthenia gravis associated with thymoma   . Restless leg syndrome     Subjective:  CC:   Chief Complaint  Patient presents with  . Arm Problem    patient has swollen lymph nodes under his arm pits that are uncomfortable    HPI:   Frank Cabrera a 47 y.o. male who presents Acute visit for axillary adenopathy. Patient states that the area under both arms has felt tingly for the past two weeks.  He has not felt any swelling.  No recent head or scalp infections.  No medication changes except he started taking aledronate.  Does recall changing his underarm deodorant a few weeks ago to 'whatever was on sale."   2) weight gain.  Patient notes that he has had a weight gain of 20 lbs since august 2013 despite a prudent diet and regular exercise.  Marland Kitchen  He is on chronic prednisone for management of myasthenia gravis.     Past Medical History  Diagnosis Date  . Myasthenia gravis associated with thymoma   . Restless leg syndrome   . Herpes simplex type II infection 2003    Past Surgical History  Procedure Laterality Date  . Total thymectomy  1995  . Eye muscle surgery  2008    for strabismus, [facility],  Board  . Septoplasty  March 2013  . Strabismus surgery  2006       The following portions of the patient's history were reviewed and updated as appropriate: Allergies, current medications, and problem list.    Review of Systems:   12 Pt  review of systems was  negative except those addressed in the HPI,     History   Social History  . Marital Status: Married    Spouse Name: N/A    Number of Children: N/A  . Years of Education: N/A   Occupational History  . Not on file.   Social History Main Topics  . Smoking status: Never Smoker   . Smokeless tobacco: Never Used  . Alcohol Use: No  . Drug Use: No  . Sexual Activity: Not on file   Other Topics Concern  . Not on file   Social History Narrative  . No narrative on file    Objective:  Filed Vitals:   08/01/13 0852  BP: 104/70  Pulse: 61  Temp: 98.3 F (36.8 C)     General appearance: alert, cooperative and appears stated age Ears: normal TM's and external ear canals both ears Throat: lips, mucosa, and tongue normal; teeth and gums normal Neck: no adenopathy, no carotid bruit, supple, symmetrical, trachea midline and thyroid not enlarged, symmetric, no tenderness/mass/nodules Back: symmetric, no curvature. ROM normal. No CVA tenderness. Lungs: clear to auscultation bilaterally Heart: regular rate and rhythm, S1, S2 normal, no murmur, click, rub or gallop Abdomen: soft, non-tender; bowel sounds normal; no masses,  no organomegaly Pulses: 2+ and symmetric Skin: Skin color, texture, turgor normal.  No rashes or lesions Lymph nodes: Cervical, supraclavicular, and axillary nodes normal.  Assessment and Plan:  Weight gain due to medication Recent labs did not check thyroid function or screen for new onset DM.  Suspect secondary to prednisone use. Recommended food diary x 3 days and low GI diet. Advised to return for fasting labs and discussion of diet in a non acute visit.   Steroid-induced osteoporosis Managed by Dr [facility].  Alendronate 35 mg weekly started.  Vitamin D supplementation with Drisdol prn low D.  Continue Weight training   Axillary symptom Is exam was entirely normal.  NO LAD.  Patient concerned that he had colon CA given his recent change in  bowel habigts, which have been under investigation by GI.  Marland Kitchen  Reassurance provided that he has no LAD .  Suggested that his symptoms were due to his topical deodorant and advised to change brands.    Updated Medication List Outpatient Encounter Prescriptions as of 08/01/2013  Medication Sig Dispense Refill  . CALCIUM PO Take 300 mg by mouth daily before breakfast.      . ferrous gluconate (FERGON) 324 MG tablet Take 324 mg by mouth 3 (three) times daily with meals.      . Flaxseed, Linseed, (FLAXSEED OIL) 1000 MG CAPS Take by mouth daily.      . folic acid (FOLVITE) 1 MG tablet Take 1 mg by mouth daily.      . folic acid (FOLVITE) 1 MG tablet 1 tablet by mouth each day EXCEPT for the day you takes methotrexate      . methotrexate (RHEUMATREX) 15 MG tablet Take 20 mg by mouth once a week. Caution: Chemotherapy. Protect from light.      . Misc Natural Products (OSTEO BI-FLEX JOINT SHIELD PO) Take 1 tablet by mouth daily.       . Multiple Vitamin (MULTIVITAMIN) tablet Take 1 tablet by mouth daily.      . Pramipexole Dihydrochloride 0.375 MG TB24 Take by mouth at bedtime.      . predniSONE (DELTASONE) 5 MG tablet Take 15 mg by mouth daily.      . Vitamin D, Ergocalciferol, (DRISDOL) 50000 UNITS CAPS capsule Take by mouth.      Marland Kitchen alendronate (FOSAMAX) 35 MG tablet Take 35 mg by mouth once a week.      . beclomethasone (QVAR) 80 MCG/ACT inhaler Inhale 1 puff into the lungs 2 (two) times daily.  1 Inhaler  0  . dicyclomine (BENTYL) 20 MG tablet Take 1 tablet (20 mg total) by mouth every 6 (six) hours.  90 tablet  31  . omeprazole (PRILOSEC) 20 MG capsule Take 20 mg by mouth 2 (two) times daily.      . Probiotic Product (PROBIOTIC DAILY PO) Take by mouth. Ultimate 10      . Turmeric 500 MG CAPS Take 1 tablet by mouth 2 (two) times daily.        No facility-administered encounter medications on file as of 08/01/2013.     Orders Placed This Encounter  Procedures  . HM DEXA SCAN    No Follow-up  on file.

## 2013-08-01 NOTE — Patient Instructions (Signed)
You have no enlarged lymph nodes.  Your tingling may be due to your anti persipirant/deodorant change   This is  One version of a  "Low GI"  Diet:  It will still lower your blood sugars and allow you to lose 4 to 8  lbs  per month if you follow it carefully.  Your goal with exercise is a minimum of 30 minutes of aerobic exercise 5 days per week (Walking does not count once it becomes easy!)    All of the foods can be found at grocery stores and in bulk at [employer].  The Atkins protein bars and shakes are available in more varieties at Target, WalMart and [employer]'s Foods.     7 AM Breakfast:  Choose from the following:  Low carbohydrate Protein  Shakes (I recommend the EAS AdvantEdge "Carb Control" shakes  Or the low carb shakes by Atkins.    2.5 carbs   Arnold's "Sandwhich Thin"toasted  w/ peanut butter (no jelly: about 20 net carbs  "Bagel Thin" with cream cheese and salmon: about 20 carbs   a scrambled egg/bacon/cheese burrito made with Mission's "carb balance" whole wheat tortilla  (about 10 net carbs )   Avoid cereal and bananas, oatmeal and cream of wheat and grits. They are loaded with carbohydrates!   10 AM: high protein snack  Protein bar by Atkins (the snack size, under 200 cal, usually < 6 net carbs).    A stick of cheese:  Around 1 carb,  100 cal     Dannon Light n Fit [place] Yogurt  (80 cal, 8 carbs)  Other so called "protein bars" and Greek yogurts tend to be loaded with carbohydrates.  Remember, in food advertising, the word "energy" is synonymous for " carbohydrate."  Lunch:   A Sandwich using the bread choices listed, Can use any  Eggs,  lunchmeat, grilled meat or canned tuna), avocado, regular mayo/mustard  and cheese.  A Salad using blue cheese, ranch,  Goddess or vinagrette,  No croutons or "confetti" and no "candied nuts" but regular nuts OK.   No pretzels or chips.  Pickles and miniature sweet peppers are a good low carb alternative that provide a "crunch"  The bread is  the only source of carbohydrate in a sandwich and  can be decreased by trying some of these alternatives to traditional loaf bread  Joseph's makes a pita bread and a flat bread that are 50 cal and 4 net carbs available at BJs and WalMart.  This can be toasted to use with hummous as well  Toufayan makes a low carb flatbread that's 100 cal and 9 net carbs available at [employer] and [employer] makes 2 sizes of  Low carb whole wheat tortilla  (The large one is 210 cal and 6 net carbs) Avoid "Low fat dressings, as well as Reyne Dumas and [address] dressings They are loaded with sugar!   3 PM/ Mid day  Snack:  Consider  1 ounce of  almonds, walnuts, pistachios, pecans, peanuts,  Macadamia nuts or a nut medley.  Avoid "granola"; the dried cranberries and raisins are loaded with carbohydrates. Mixed nuts as long as there are no raisins,  cranberries or dried fruit.     6 PM  Dinner:     Meat/fowl/fish with a green salad, and either broccoli, cauliflower, green beans, spinach, brussel sprouts or  Lima beans. DO NOT BREAD THE PROTEIN!!      There is a low carb pasta  by Dreamfield's that is acceptable and tastes great: only 5 digestible carbs/serving.( All grocery stores but BJs carry it )  Try Kai Levins Angelo's chicken piccata or chicken or eggplant parm over low carb pasta.(Lowes and BJs)   Clifton Custard Sanchez's "Carnitas" (pulled pork, no sauce,  0 carbs) or his beef pot roast to make a dinner burrito (at BJ's)  Pesto over low carb pasta (bj's sells a good quality pesto in the center refrigerated section of the deli   Whole wheat pasta is still full of digestible carbs and  Not as low in glycemic index as Dreamfield's.   Brown rice is still rice,  So skip the rice and noodles if you eat [place] or [place] (or at least limit to 1/2 cup)  9 PM snack :   Breyer's "low carb" fudgsicle or  ice cream bar (Carb Smart line), or  Weight Watcher's ice cream bar , or another "no sugar added" ice cream;  a serving  of fresh berries/cherries with whipped cream   Cheese or DANNON'S LlGHT N FIT [place] YOGURT  Avoid bananas, pineapple, grapes  and watermelon on a regular basis because they are high in sugar.  THINK OF THEM AS DESSERT  Remember that snack Substitutions should be less than 10 NET carbs per serving and meals < 20 carbs. Remember to subtract fiber grams to get the "net carbs."

## 2013-08-02 ENCOUNTER — Telehealth: Payer: Self-pay | Admitting: Internal Medicine

## 2013-08-02 DIAGNOSIS — R635 Abnormal weight gain: Secondary | ICD-10-CM | POA: Insufficient documentation

## 2013-08-02 DIAGNOSIS — IMO0002 Reserved for concepts with insufficient information to code with codable children: Secondary | ICD-10-CM | POA: Insufficient documentation

## 2013-08-02 DIAGNOSIS — R202 Paresthesia of skin: Secondary | ICD-10-CM | POA: Insufficient documentation

## 2013-08-02 DIAGNOSIS — M818 Other osteoporosis without current pathological fracture: Secondary | ICD-10-CM | POA: Insufficient documentation

## 2013-08-02 NOTE — Assessment & Plan Note (Signed)
Is exam was entirely normal.  NO LAD.  Patient concerned that he had colon CA given his recent change in bowel habigts, which have been under investigation by GI.  Marland Kitchen  Reassurance provided that he has no LAD .  Suggested that his symptoms were due to his topical deodorant and advised to change brands.

## 2013-08-02 NOTE — Assessment & Plan Note (Addendum)
Recent labs did not check thyroid function or screen for new onset DM.  Per Frank Cabrera clinic notes from July GI evaluation, his fasting glucose was 112 .  Suspect secondary to prednisone use. Recommended food diary x 3 days and low GI diet. Advised to return for fasting labs and discussion of diet in a non acute visit.

## 2013-08-02 NOTE — Assessment & Plan Note (Addendum)
Managed by Dr Renae Fickle, [facility].  Alendronate 35 mg weekly started.  Vitamin D supplementation with Drisdol prn low D.  Continue Weight training

## 2013-08-06 NOTE — Telephone Encounter (Signed)
Mailed unread message to pt  

## 2013-08-09 ENCOUNTER — Other Ambulatory Visit: Payer: Self-pay

## 2013-08-10 ENCOUNTER — Encounter: Payer: Self-pay | Admitting: Internal Medicine

## 2013-12-05 ENCOUNTER — Encounter: Payer: Self-pay | Admitting: Internal Medicine

## 2013-12-05 ENCOUNTER — Telehealth: Payer: Self-pay | Admitting: Internal Medicine

## 2013-12-05 NOTE — Telephone Encounter (Signed)
Patient Information:  Caller Name: Lavere  Phone: [phone]  Patient: Cloy, Cozzens  Gender: Male  DOB: Apr 24, 1966  Age: 48 Years  PCP: Deborra Medina (Adults only)  Office Follow Up:  Does the office need to follow up with this patient?: No  Instructions For The Office: N/A  RN Note:  Home care and advice and call back parameters reviewed. Advised to closely mointor and call back for questions, changes or concerns.  Symptoms  Reason For Call & Symptoms: Patient states he had he was awakened in  middle of night to go to bathroom. He had a lot of gas , very little stool-liquid pinkish .  Size of Tablespoon.  No mucus or pus. Afebrile. He had anothe x2 more BM stool was soft and no blood. No cramping or pain . Recent cold -OTC mucinex D, Cold medication.  Reviewed Health History In EMR: Yes  Reviewed Medications In EMR: Yes  Reviewed Allergies In EMR: Yes  Reviewed Surgeries / Procedures: Yes  Date of Onset of Symptoms: 12/04/2013  Guideline(s) Used:  Rectal Bleeding  Disposition Per Guideline:   See Within 2 Weeks in Office  Reason For Disposition Reached:   Rectal bleeding is minimal (e.g., blood just on toilet paper, a few drops in toilet bowl)  Advice Given:  General Instructions for Treating and Preventing Constipation:   Eat a high fiber diet.  Drink adequate liquids.  Don't ignore your body's signals to have a BM.  High Fiber Diet:  Try to eat fresh fruit and vegetables at each meal (peas, prunes, citrus, apples, beans, corn).  Eat more grain foods (bran flakes, bran muffins, graham crackers, oatmeal, brown rice, and whole wheat bread).  Call Back If:  Bleeding increases in amount  Bleeding occurs 3 or more times after treatment begins  You become worse.  Patient Will Follow Care Advice:  YES

## 2013-12-05 NOTE — Telephone Encounter (Signed)
FYI

## 2013-12-06 NOTE — Telephone Encounter (Signed)
See CAN note from yesterday also

## 2013-12-24 ENCOUNTER — Encounter: Payer: Self-pay | Admitting: Internal Medicine

## 2013-12-26 ENCOUNTER — Ambulatory Visit (INDEPENDENT_AMBULATORY_CARE_PROVIDER_SITE_OTHER): Payer: No Typology Code available for payment source | Admitting: Internal Medicine

## 2013-12-26 ENCOUNTER — Encounter: Payer: Self-pay | Admitting: Internal Medicine

## 2013-12-26 VITALS — BP 102/60 | HR 74 | Temp 98.3°F | Resp 16 | Wt 164.2 lb

## 2013-12-26 DIAGNOSIS — M7750 Other enthesopathy of unspecified foot: Secondary | ICD-10-CM

## 2013-12-26 NOTE — Progress Notes (Signed)
Pre-visit discussion using our clinic review tool. No additional management support is needed unless otherwise documented below in the visit note.  

## 2013-12-26 NOTE — Progress Notes (Signed)
Patient ID: Frank Cabrera, male   DOB: 31-Oct-1965, 48 y.o.   MRN: [id number]  Patient Active Problem List   Diagnosis Date Noted  . Retrocalcaneal bursitis (back of heel) 12/28/2013  . Weight gain due to medication 08/02/2013  . 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 Axillary symptom 08/02/2013  . Elbow pain, left 10/20/2012  . Diarrhea 10/18/2012  . Shortness of breath 08/01/2012  . Asthma 08/01/2012  . Chronic cough 06/01/2012  . Reflux 06/01/2012  . Pansinusitis 06/01/2012  . Conductive hearing loss in left ear 06/01/2012  . Herpes simplex type II infection   . Myasthenia gravis associated with thymoma   . Restless leg syndrome     Subjective:  CC:   Chief Complaint  Patient presents with  . Acute Visit    Left  side between ankle and heel of foot.  Tender to walk on    HPI:   Frank Cabrera is a 48 y.o. male who presents for Right heel pain .  Symptoms started a week or so ago.  No history of fall or trauma.  Pain is most aggravated by  weight bearing  But also notes tenderness squeezing the posterior heel on either side of the achilles tendon behind each malleoli. Has avoided playing tennis for the past week . Has not tried NSAIDs or icing the heel.     Past Medical History  Diagnosis Date  . Myasthenia gravis associated with thymoma   . Restless leg syndrome   . Herpes simplex type II infection 2003    Past Surgical History  Procedure Laterality Date  . Total thymectomy  1995  . Eye muscle surgery  2008    for strabismus, [facility],  Board  . Septoplasty  March 2013  . Strabismus surgery  2006       The following portions of the patient's history were reviewed and updated as appropriate: Allergies, current medications, and problem list.    Review of Systems:   Patient denies headache, fevers, malaise, unintentional weight loss, skin rash, eye pain, sinus congestion and sinus pain, sore throat, dysphagia,  hemoptysis , cough, dyspnea, wheezing, chest  pain, palpitations, orthopnea, edema, abdominal pain, nausea, melena, diarrhea, constipation, flank pain, dysuria, hematuria, urinary  Frequency, nocturia, numbness, tingling, seizures,  Focal weakness, Loss of consciousness,  Tremor, insomnia, depression, anxiety, and suicidal ideation.     History   Social History  . Marital Status: Married    Spouse Name: N/A    Number of Children: N/A  . Years of Education: N/A   Occupational History  . Not on file.   Social History Main Topics  . Smoking status: Never Smoker   . Smokeless tobacco: Never Used  . Alcohol Use: No  . Drug Use: No  . Sexual Activity: Yes   Other Topics Concern  . Not on file   Social History Narrative  . No narrative on file    Objective:  Filed Vitals:   12/26/13 1408  BP: 102/60  Pulse: 74  Temp: 98.3 F (36.8 C)  Resp: 16     General appearance: alert, cooperative and appears stated age Ears: normal TM's and external ear canals both ears Throat: lips, mucosa, and tongue normal; teeth and gums normal Neck: no adenopathy, no carotid bruit, supple, symmetrical, trachea midline and thyroid not enlarged, symmetric, no tenderness/mass/nodules Back: symmetric, no curvature. ROM normal. No CVA tenderness. Lungs: clear to auscultation bilaterally Heart: regular rate and rhythm, S1, S2 normal, no murmur, click, rub  or gallop Abdomen: soft, non-tender; bowel sounds normal; no masses,  no organomegaly Pulses: 2+ and symmetric Skin: Skin color, texture, turgor normal. No rashes or lesions Lymph nodes: Cervical, supraclavicular, and axillary nodes normal. Ext : leftt heel with minimal swelling,  No bruising,  ROM full   Assessment and Plan:  Retrocalcaneal bursitis (back of heel) Ice and elevation , NSAIds, activity modification.    Updated Medication List Outpatient Encounter Prescriptions as of 12/26/2013  Medication Sig  . alendronate (FOSAMAX) 35 MG tablet Take 35 mg by mouth once a week.  Marland Kitchen  CALCIUM PO Take 300 mg by mouth daily before breakfast.  . Flaxseed, Linseed, (FLAXSEED OIL) 1000 MG CAPS Take by mouth daily.  . folic acid (FOLVITE) 1 MG tablet Take 1 mg by mouth daily.  . folic acid (FOLVITE) 1 MG tablet 1 tablet by mouth each day EXCEPT for the day you takes methotrexate  . methotrexate (RHEUMATREX) 15 MG tablet Take 20 mg by mouth once a week. Caution: Chemotherapy. Protect from light.  . Misc Natural Products (OSTEO BI-FLEX JOINT SHIELD PO) Take 1 tablet by mouth daily.   . Multiple Vitamin (MULTIVITAMIN) tablet Take 1 tablet by mouth daily.  . Pramipexole Dihydrochloride 0.375 MG TB24 Take by mouth at bedtime.  . predniSONE (DELTASONE) 5 MG tablet Take 15 mg by mouth daily.  . Vitamin D, Ergocalciferol, (DRISDOL) 50000 UNITS CAPS capsule Take by mouth.  . beclomethasone (QVAR) 80 MCG/ACT inhaler Inhale 1 puff into the lungs 2 (two) times daily.  Marland Kitchen dicyclomine (BENTYL) 20 MG tablet Take 1 tablet (20 mg total) by mouth every 6 (six) hours.  . ferrous gluconate (FERGON) 324 MG tablet Take 324 mg by mouth 3 (three) times daily with meals.  Marland Kitchen omeprazole (PRILOSEC) 20 MG capsule Take 20 mg by mouth 2 (two) times daily.  . Probiotic Product (PROBIOTIC DAILY PO) Take by mouth. Ultimate 10  . Turmeric 500 MG CAPS Take 1 tablet by mouth 2 (two) times daily.      No orders of the defined types were placed in this encounter.    No Follow-up on file.

## 2013-12-26 NOTE — Patient Instructions (Addendum)
I believe your pain is coming from 1)retrocalcaneal bursitis or  2) Posterior tibialis tenosynvitis   1) Ice and elevation  Avoid stair climbing jumpimng and jogging for another week   A Padded heel cup may help (available OTC)  2) Arch supports may help along with a velcro pull on brace

## 2013-12-28 ENCOUNTER — Encounter: Payer: Self-pay | Admitting: Internal Medicine

## 2013-12-28 DIAGNOSIS — M7750 Other enthesopathy of unspecified foot: Secondary | ICD-10-CM | POA: Insufficient documentation

## 2013-12-28 NOTE — Assessment & Plan Note (Signed)
Ice and elevation , NSAIds, activity modification.

## 2014-01-08 NOTE — Telephone Encounter (Signed)
Unread mychart message mailed to patient 

## 2014-06-24 DIAGNOSIS — E559 Vitamin D deficiency, unspecified: Secondary | ICD-10-CM | POA: Insufficient documentation

## 2015-01-26 NOTE — Op Note (Signed)
PATIENT NAME:  Frank Cabrera, Frank Cabrera MR#:  [id number] DATE OF BIRTH:  Jun 10, 1966  DATE OF PROCEDURE:  12/07/2011  PREOPERATIVE DIAGNOSIS:  Nasal obstruction.  POSTOPERATIVE DIAGNOSIS:  Nasal obstruction.  SURGEON:  Roena Malady, M.D.  NAME OF PROCEDURE:  Nasal septoplasty. Bilateral submucous resection of inferior turbinates.  OPERATIVE FINDINGS:  Severe left septal deformity and bilateral inferior turbinate hypertrophy.   DESCRIPTION OF THE PROCEDURE:  Catherine was identified in the holding area and taken to the operating room and placed in the supine position.  After general endotracheal anesthesia was induced, the table was turned 45 degrees and the patient was placed in a semi-Fowler position.  The nose was then topically anesthetized with Lidocaine, cotton pledgets were placed within each nostril. After approximately 5 minutes, this was removed at which time a local anesthetic of 1% Lidocaine 1:100,000 units of Epinephrine was used to inject the inferior turbinates in the nasal septum. A total of 12 mL was used. Examination of the nose showed a severe left nasal septal deformity and tremendous hypertrophied inferior turbinate.  Beginning on the right hand side a hemitransfixion incision was then created on the leading edge of the septum on the right.  A subperichondrial plane was elevated posteriorly on the left and taken back to the perpendicular plate of the ethmoid where subperiosteal plane was elevated posteriorly on the left. A large septal spur was identified on the left-hand side impacting on the inferior turbinate.  An inferior rim of cartilage was removed anteriorly with care taken to leave an anterior strut to prevent nasal collapse. With this strut removed the perpendicular plate of the ethmoid was separated from the quadrangular cartilage. The large septal spur was removed.  The septum was then replaced in the midline. Reinspection through each nostril showed excellent reduction of the  septal deformity. A left posterior inferior fenestration was then created to allow hematoma drainage.  With the septoplasty completed, beginning on the left-hand side, a 15 blade was used to incise along the inferior edge of the inferior turbinate. A superior laterally based flap was then elevated. The underlying conchal bone of mucosa was excised using Knight scissors. The flap was then laid back over the turbinate stump and cauterized using suction cautery. In a similar fashion the submucous resection was performed on the right.  With the submucous resection completed bilaterally and no active bleeding, the hemitransfixion incision was then closed using two interrupted 3-0 chromic sutures.  Plastic nasal septal splints were placed within each nostril and affixed to the septum using a 3-0 nylon suture. FloSeal was then used beneath each inferior turbinate for hemostasis.    The patient tolerated the procedure well, was returned to anesthesia, extubated in the operating room, and taken to the recovery room in stable condition.    CULTURES:  None.  SPECIMENS:  None.  ESTIMATED BLOOD LOSS:  Less than 20 mL.   ____________________________ Roena Malady, MD ctm:[id number] D: 12/07/2011 14:06:43 ET T: 12/07/2011 15:51:31 ET JOB#: [id number]  cc: Roena Malady, MD, <Dictator> Roena Malady MD ELECTRONICALLY SIGNED 12/31/2011 8:37

## 2015-04-14 ENCOUNTER — Encounter: Payer: Self-pay | Admitting: Internal Medicine

## 2015-04-14 ENCOUNTER — Ambulatory Visit (INDEPENDENT_AMBULATORY_CARE_PROVIDER_SITE_OTHER): Payer: 59 | Admitting: Internal Medicine

## 2015-04-14 VITALS — BP 97/62 | HR 60 | Temp 98.5°F | Wt 164.4 lb

## 2015-04-14 DIAGNOSIS — E785 Hyperlipidemia, unspecified: Secondary | ICD-10-CM | POA: Diagnosis not present

## 2015-04-14 DIAGNOSIS — M4000 Postural kyphosis, site unspecified: Secondary | ICD-10-CM | POA: Diagnosis not present

## 2015-04-14 DIAGNOSIS — R5383 Other fatigue: Secondary | ICD-10-CM

## 2015-04-14 DIAGNOSIS — M545 Low back pain, unspecified: Secondary | ICD-10-CM

## 2015-04-14 DIAGNOSIS — M546 Pain in thoracic spine: Secondary | ICD-10-CM | POA: Diagnosis not present

## 2015-04-14 NOTE — Progress Notes (Signed)
Pre visit review using our clinic review tool, if applicable. No additional management support is needed unless otherwise documented below in the visit note. 

## 2015-04-14 NOTE — Progress Notes (Signed)
Subjective:  Patient ID: Frank Cabrera, male    DOB: 05/26/1966  Age: 49 y.o. MRN: [id number]  CC: The primary encounter diagnosis was Right-sided thoracic back pain. Diagnoses of Kyphosis (acquired) (postural), Hyperlipidemia, Other fatigue, Low back pain potentially associated with radiculopathy, and Bilateral thoracic back pain were also pertinent to this visit.  HPI Frank Cabrera presents for recent episode or right testicular pain while [place]  Occurred after driving for 3-4 hours  Last 24 hours   Resolved spontaneously.  No masses.  Has been having some low back pain aggravated by sitting  Also havein bilateral thoracic back pain aggravated bu twisting and bending.      Outpatient Prescriptions Prior to Visit  Medication Sig Dispense Refill  . alendronate (FOSAMAX) 35 MG tablet Take 35 mg by mouth once a week.    Marland Kitchen CALCIUM PO Take 300 mg by mouth daily before breakfast.    . ferrous gluconate (FERGON) 324 MG tablet Take 324 mg by mouth 3 (three) times daily with meals.    . Flaxseed, Linseed, (FLAXSEED OIL) 1000 MG CAPS Take by mouth daily.    . folic acid (FOLVITE) 1 MG tablet 1 tablet by mouth each day EXCEPT for the day you takes methotrexate    . methotrexate (RHEUMATREX) 15 MG tablet Take 20 mg by mouth once a week. Caution: Chemotherapy. Protect from light.    . Misc Natural Products (OSTEO BI-FLEX JOINT SHIELD PO) Take 1 tablet by mouth daily.     . Multiple Vitamin (MULTIVITAMIN) tablet Take 1 tablet by mouth daily.    Marland Kitchen omeprazole (PRILOSEC) 20 MG capsule Take 20 mg by mouth 2 (two) times daily.    . Pramipexole Dihydrochloride 0.375 MG TB24 Take by mouth at bedtime.    . predniSONE (DELTASONE) 5 MG tablet Take 15 mg by mouth daily.    . Vitamin D, Ergocalciferol, (DRISDOL) 50000 UNITS CAPS capsule Take by mouth.    . beclomethasone (QVAR) 80 MCG/ACT inhaler Inhale 1 puff into the lungs 2 (two) times daily. 1 Inhaler 0  .  dicyclomine (BENTYL) 20 MG tablet Take 1 tablet (20 mg total) by mouth every 6 (six) hours. 90 tablet 31  . folic acid (FOLVITE) 1 MG tablet Take 1 mg by mouth daily.    . Probiotic Product (PROBIOTIC DAILY PO) Take by mouth. Ultimate 10    . Turmeric 500 MG CAPS Take 1 tablet by mouth 2 (two) times daily.      No facility-administered medications prior to visit.    Review of Systems;  Patient denies headache, fevers, malaise, unintentional weight loss, skin rash, eye pain, sinus congestion and sinus pain, sore throat, dysphagia,  hemoptysis , cough, dyspnea, wheezing, chest pain, palpitations, orthopnea, edema, abdominal pain, nausea, melena, diarrhea, constipation, flank pain, dysuria, hematuria, urinary  Frequency, nocturia, numbness, tingling, seizures,  Focal weakness, Loss of consciousness,  Tremor, insomnia, depression, anxiety, and suicidal ideation.      Objective:  BP 97/62 mmHg  Pulse 60  Temp(Src) 98.5 F (36.9 C) (Oral)  Wt 164 lb 6 oz (74.56 kg)  SpO2 97%  BP Readings from Last 3 Encounters:  04/14/15 97/62  12/26/13 102/60  08/01/13 104/70    Wt Readings from Last 3 Encounters:  04/14/15 164 lb 6 oz (74.56 kg)  12/26/13 164 lb 4 oz (74.503 kg)  08/01/13 169 lb (76.658 kg)    General appearance: alert, cooperative and appears stated age  Ears: normal TM's and external ear canals both ears Throat: lips, mucosa, and tongue normal; teeth and gums normal Neck: no adenopathy, no carotid bruit, supple, symmetrical, trachea midline and thyroid not enlarged, symmetric, no tenderness/mass/nodules Back: symmetric, no curvature. ROM normal. No CVA tenderness. Lungs: clear to auscultation bilaterally Heart: regular rate and rhythm, S1, S2 normal, no murmur, click, rub or gallop Abdomen: soft, non-tender; bowel sounds normal; no masses,  no organomegaly Pulses: 2+ and symmetric Skin: Skin color, texture, turgor normal. No rashes or lesions Lymph nodes: Cervical,  supraclavicular, and axillary nodes normal.  No results found for: HGBA1C  Lab Results  Component Value Date   CREATININE 0.84 01/19/2012    Lab Results  Component Value Date   WBC 9.8 05/31/2012   HGB 14.4 05/31/2012   HCT 43.9 05/31/2012   PLT 310.0 05/31/2012   ALT 24 01/19/2012   AST 20 01/19/2012   CREATININE 0.84 01/19/2012    No results found.  Assessment & Plan:   Problem List Items Addressed This Visit      Unprioritized   Low back pain potentially associated with radiculopathy    Intermittent, now resolved,  With radiation to right testicle, reassurance provided,       Bilateral thoracic back pain    He has mild scoliosis on exam. His pain appears to be muscle strain . Plain films ordered.        Other Visit Diagnoses    Right-sided thoracic back pain    -  Primary    Relevant Orders    DG Thoracic Spine W/Swimmers    Kyphosis (acquired) (postural)        Relevant Orders    DG Cervical Spine Complete    Hyperlipidemia        Relevant Orders    Lipid panel    Other fatigue        Relevant Orders    Comprehensive metabolic panel    TSH       I have discontinued Frank Cabrera Turmeric, Probiotic Product (PROBIOTIC DAILY PO), dicyclomine, and beclomethasone. I am also having him maintain his ferrous gluconate, Pramipexole Dihydrochloride, methotrexate, Flaxseed Oil, multivitamin, Misc Natural Products (OSTEO BI-FLEX JOINT SHIELD PO), predniSONE, omeprazole, CALCIUM PO, Vitamin D (Ergocalciferol), folic acid, and alendronate.  No orders of the defined types were placed in this encounter.    Medications Discontinued During This Encounter  Medication Reason  . beclomethasone (QVAR) 80 MCG/ACT inhaler Patient Preference  . dicyclomine (BENTYL) 20 MG tablet Patient Preference  . folic acid (FOLVITE) 1 MG tablet Duplicate  . Turmeric 500 MG CAPS Patient Preference  . Probiotic Product (PROBIOTIC DAILY PO) Patient Preference    Follow-up: Return in  about 1 year (around 04/13/2016).   [name], MD

## 2015-04-14 NOTE — Patient Instructions (Addendum)
I have ordered Plain x rays to be done at [facility], at your leisure,  of cervical spine and thoracic spine  I am doing this because you have mild scoliosis and kyphosis  On your  exam today   I am ordering PT referral for core strengthening exercises  Your testicular pain appears to have been  referred pain from your low back   Please make a fasting lab appt some time this month so we can check your cholesterol

## 2015-04-16 DIAGNOSIS — M545 Low back pain, unspecified: Secondary | ICD-10-CM | POA: Insufficient documentation

## 2015-04-16 DIAGNOSIS — M546 Pain in thoracic spine: Secondary | ICD-10-CM | POA: Insufficient documentation

## 2015-04-16 NOTE — Assessment & Plan Note (Signed)
He has mild scoliosis on exam. His pain appears to be muscle strain . Plain films ordered.

## 2015-04-16 NOTE — Assessment & Plan Note (Signed)
Intermittent, now resolved,  With radiation to right testicle, reassurance provided,

## 2015-04-18 ENCOUNTER — Ambulatory Visit
Admission: RE | Admit: 2015-04-18 | Discharge: 2015-04-18 | Disposition: A | Discharge status: Home / Self Care | Payer: 59 | Source: Ambulatory Visit | Attending: Internal Medicine | Admitting: Internal Medicine

## 2015-04-18 DIAGNOSIS — M546 Pain in thoracic spine: Secondary | ICD-10-CM

## 2015-04-18 DIAGNOSIS — M4000 Postural kyphosis, site unspecified: Secondary | ICD-10-CM

## 2015-04-23 ENCOUNTER — Encounter: Payer: Self-pay | Admitting: *Deleted

## 2015-04-24 ENCOUNTER — Telehealth: Payer: Self-pay | Admitting: Internal Medicine

## 2015-04-24 DIAGNOSIS — M545 Low back pain: Secondary | ICD-10-CM

## 2015-04-24 NOTE — Telephone Encounter (Signed)
Patient return call and stated he would like to proceed with Physical therapy. For his back.

## 2015-04-24 NOTE — Telephone Encounter (Signed)
Referral is in process as requested 

## 2015-04-25 NOTE — Telephone Encounter (Signed)
Left detailed message on Vm that referral has been placed.

## 2015-05-20 ENCOUNTER — Ambulatory Visit: Payer: 59 | Admitting: Physical Therapy

## 2015-05-20 ENCOUNTER — Telehealth: Payer: Self-pay | Admitting: *Deleted

## 2015-05-20 NOTE — Telephone Encounter (Signed)
Patient has requested Lab results. Patient has been locked out of his mychart account. Thanks

## 2015-05-20 NOTE — Telephone Encounter (Signed)
Left message to return call 

## 2015-05-22 NOTE — Telephone Encounter (Signed)
Called pt again, unable to contact.  Closing message until pt returns call

## 2015-05-26 ENCOUNTER — Encounter: Payer: 59 | Admitting: Physical Therapy

## 2015-05-26 ENCOUNTER — Ambulatory Visit: Payer: 59 | Admitting: Physical Therapy

## 2015-05-28 ENCOUNTER — Encounter: Payer: 59 | Admitting: Physical Therapy

## 2015-05-29 ENCOUNTER — Encounter: Payer: 59 | Admitting: Physical Therapy

## 2015-06-16 ENCOUNTER — Ambulatory Visit: Payer: 59 | Admitting: Physical Therapy

## 2015-06-19 ENCOUNTER — Encounter: Payer: 59 | Admitting: Physical Therapy

## 2015-06-25 ENCOUNTER — Encounter: Payer: 59 | Admitting: Physical Therapy

## 2015-06-27 ENCOUNTER — Encounter: Payer: 59 | Admitting: Physical Therapy

## 2015-08-31 ENCOUNTER — Telehealth: Payer: Self-pay | Admitting: Internal Medicine

## 2015-08-31 DIAGNOSIS — E785 Hyperlipidemia, unspecified: Secondary | ICD-10-CM

## 2015-08-31 DIAGNOSIS — Z79899 Other long term (current) drug therapy: Secondary | ICD-10-CM

## 2015-08-31 MED ORDER — ALENDRONATE SODIUM 35 MG PO TABS: 35.0000 mg | ORAL_TABLET | ORAL | Status: DC

## 2015-08-31 NOTE — Telephone Encounter (Signed)
LABS ORDERED PER LETTER, INCLUDING FASTING LIPIDS,  AND   ALENDRONATE REFILLED PER LETTER REQUEST

## 2015-09-01 NOTE — Telephone Encounter (Signed)
Left message to call office

## 2015-09-02 NOTE — Telephone Encounter (Signed)
Left message for patient to call office.  

## 2015-09-04 NOTE — Telephone Encounter (Signed)
Left message to call office

## 2015-09-15 ENCOUNTER — Telehealth: Payer: Self-pay | Admitting: Internal Medicine

## 2015-09-15 NOTE — Telephone Encounter (Signed)
Frank Cabrera Please advise? 

## 2015-09-15 NOTE — Telephone Encounter (Signed)
Pt called and asked for [facility].

## 2015-09-15 NOTE — Telephone Encounter (Signed)
Patient to call and schedule lab appointment,

## 2015-10-01 ENCOUNTER — Other Ambulatory Visit (INDEPENDENT_AMBULATORY_CARE_PROVIDER_SITE_OTHER): Payer: 59

## 2015-10-01 DIAGNOSIS — Z79899 Other long term (current) drug therapy: Secondary | ICD-10-CM

## 2015-10-01 DIAGNOSIS — R5383 Other fatigue: Secondary | ICD-10-CM | POA: Diagnosis not present

## 2015-10-01 DIAGNOSIS — E785 Hyperlipidemia, unspecified: Secondary | ICD-10-CM

## 2015-10-01 NOTE — Addendum Note (Signed)
Addended by: Karlene Einstein D on: 10/01/2015 10:51 AM   Modules accepted: Orders

## 2015-10-02 LAB — CBC WITH DIFFERENTIAL/PLATELET
Basophils Absolute: 0 10*3/uL (ref 0.0–0.2)
Basos: 1 %
EOS (ABSOLUTE): 0.1 10*3/uL (ref 0.0–0.4)
Eos: 2 %
Hematocrit: 43.2 % (ref 37.5–51.0)
Hemoglobin: 14.6 g/dL (ref 12.6–17.7)
Immature Grans (Abs): 0 10*3/uL (ref 0.0–0.1)
Immature Granulocytes: 1 %
Lymphocytes Absolute: 2.4 10*3/uL (ref 0.7–3.1)
Lymphs: 32 %
MCH: 29.4 pg (ref 26.6–33.0)
MCHC: 33.8 g/dL (ref 31.5–35.7)
MCV: 87 fL (ref 79–97)
Monocytes Absolute: 0.7 10*3/uL (ref 0.1–0.9)
Monocytes: 10 %
Neutrophils Absolute: 4.1 10*3/uL (ref 1.4–7.0)
Neutrophils: 54 %
Platelets: 265 10*3/uL (ref 150–379)
RBC: 4.96 x10E6/uL (ref 4.14–5.80)
RDW: 14.3 % (ref 12.3–15.4)
WBC: 7.4 10*3/uL (ref 3.4–10.8)

## 2015-10-02 LAB — COMPREHENSIVE METABOLIC PANEL
ALT: 20 IU/L (ref 0–44)
AST: 18 IU/L (ref 0–40)
Albumin/Globulin Ratio: 2 (ref 1.1–2.5)
Albumin: 3.9 g/dL (ref 3.5–5.5)
Alkaline Phosphatase: 43 IU/L (ref 39–117)
BUN/Creatinine Ratio: 13 (ref 9–20)
BUN: 11 mg/dL (ref 6–24)
Bilirubin Total: 0.4 mg/dL (ref 0.0–1.2)
CO2: 26 mmol/L (ref 18–29)
Calcium: 9 mg/dL (ref 8.7–10.2)
Chloride: 100 mmol/L (ref 96–106)
Creatinine, Ser: 0.87 mg/dL (ref 0.76–1.27)
GFR calc Af Amer: 119 mL/min/{1.73_m2} (ref >59)
GFR calc non Af Amer: 103 mL/min/{1.73_m2} (ref >59)
Globulin, Total: 2 g/dL (ref 1.5–4.5)
Glucose: 81 mg/dL (ref 65–99)
Potassium: 4.3 mmol/L (ref 3.5–5.2)
Sodium: 140 mmol/L (ref 134–144)
Total Protein: 5.9 g/dL — ABNORMAL LOW (ref 6.0–8.5)

## 2015-10-02 LAB — LIPID PANEL
Chol/HDL Ratio: 2.7 ratio units (ref 0.0–5.0)
Cholesterol, Total: 189 mg/dL (ref 100–199)
HDL: 70 mg/dL (ref >39)
LDL Calculated: 104 mg/dL — ABNORMAL HIGH (ref 0–99)
Triglycerides: 76 mg/dL (ref 0–149)
VLDL Cholesterol Cal: 15 mg/dL (ref 5–40)

## 2015-10-02 LAB — ALT: ALT: 18 IU/L (ref 0–44)

## 2015-10-02 LAB — BILIRUBIN, TOTAL: Bilirubin Total: 0.4 mg/dL (ref 0.0–1.2)

## 2015-10-02 LAB — AST: AST: 16 IU/L (ref 0–40)

## 2015-10-02 LAB — GAMMA GT: GGT: 15 IU/L (ref 0–65)

## 2015-10-03 ENCOUNTER — Other Ambulatory Visit: Payer: 59

## 2015-10-06 ENCOUNTER — Encounter: Payer: Self-pay | Admitting: Internal Medicine

## 2015-11-27 ENCOUNTER — Ambulatory Visit (INDEPENDENT_AMBULATORY_CARE_PROVIDER_SITE_OTHER): Payer: 59 | Admitting: Family Medicine

## 2015-11-27 ENCOUNTER — Encounter: Payer: Self-pay | Admitting: Family Medicine

## 2015-11-27 VITALS — BP 102/62 | HR 58 | Temp 98.1°F | Wt 156.0 lb

## 2015-11-27 DIAGNOSIS — J209 Acute bronchitis, unspecified: Secondary | ICD-10-CM | POA: Insufficient documentation

## 2015-11-27 MED ORDER — HYDROCODONE-HOMATROPINE 5-1.5 MG/5ML PO SYRP: 5.0000 mL | ORAL_SOLUTION | Freq: Three times a day (TID) | ORAL | Status: DC | PRN

## 2015-11-27 MED ORDER — DOXYCYCLINE HYCLATE 100 MG PO TABS: 100.0000 mg | ORAL_TABLET | Freq: Two times a day (BID) | ORAL | Status: DC

## 2015-11-27 NOTE — Progress Notes (Signed)
Patient ID: Frank Cabrera, male   DOB: 1966-06-05, 50 y.o.   MRN: [id number]  Tommi Rumps, MD Phone: [phone]  Frank Cabrera is a 50 y.o. male who presents today for same-day visit.  Patient notes onset of sore throat and scratchy throat on Friday. Sore throat worsened over Saturday and Sunday and then he developed some chest congestion. He notes mild cough that is minimally productive of dark yellow mucus. States low-grade fever throughout this time with highest temperature being 63F. He notes some sweats and chills as well. No shortness of breath or chest pain. No nasal or sinus congestion. States most of his symptoms are in his chest with congestion. He's been taking Delsym and over-the-counter allergy medication. Sick contacts at work. He is on methotrexate for myasthenia gravis and he is also on chronic prednisone. Patient notes he does not feel well, though does not feel incredibly ill.  PMH: nonsmoker.   ROS see history of present illness  Objective  Physical Exam Filed Vitals:   11/27/15 0932  BP: 102/62  Pulse: 58  Temp: 98.1 F (36.7 C)    BP Readings from Last 3 Encounters:  11/27/15 102/62  04/14/15 97/62  12/26/13 102/60   Wt Readings from Last 3 Encounters:  11/27/15 156 lb (70.761 kg)  04/14/15 164 lb 6 oz (74.56 kg)  12/26/13 164 lb 4 oz (74.503 kg)    Physical Exam  Constitutional: No distress.  HENT:  Head: Normocephalic and atraumatic.  Right Ear: External ear normal.  Left Ear: External ear normal.  Mild posterior oropharyngeal erythema, no tonsillar exudates, normal TMs bilaterally  Eyes: Conjunctivae are normal. Pupils are equal, round, and reactive to light.  Neck: Neck supple.  Cardiovascular: Normal rate, regular rhythm and normal heart sounds.  Exam reveals no gallop and no friction rub.   No murmur heard. Pulmonary/Chest: Effort normal and breath sounds normal. No respiratory distress. He has no wheezes. He has no rales.    Lymphadenopathy:    He has no cervical adenopathy.  Neurological: He is alert. Gait normal.  Skin: Skin is warm and dry. He is not diaphoretic.     Assessment/Plan: Please see individual problem list.  Acute bronchitis Symptoms most consistent with acute bronchitis. Benign lung exam and stable vital signs. Patient appears nontoxic. Unlikely to be pneumonia. Given that he is immunosuppressed on chronic prednisone and methotrexate I discussed treating him with an antibiotic at this time given likely increased susceptibility to infection. We'll treat with doxycycline given patient's myasthenia gravis precludes treatment with azithromycin. Hycodan for cough. Advised to monitor for drowsiness on this and respiratory issues. Patient will continue to monitor. He is given return precautions.    No orders of the defined types were placed in this encounter.    Meds ordered this encounter  Medications  . doxycycline (VIBRA-TABS) 100 MG tablet    Sig: Take 1 tablet (100 mg total) by mouth 2 (two) times daily.    Dispense:  14 tablet    Refill:  0  . HYDROcodone-homatropine (HYCODAN) 5-1.5 MG/5ML syrup    Sig: Take 5 mLs by mouth every 8 (eight) hours as needed for cough.    Dispense:  120 mL    Refill:  0    Tommi Rumps

## 2015-11-27 NOTE — Progress Notes (Signed)
Pre visit review using our clinic review tool, if applicable. No additional management support is needed unless otherwise documented below in the visit note. 

## 2015-11-27 NOTE — Assessment & Plan Note (Signed)
Symptoms most consistent with acute bronchitis. Benign lung exam and stable vital signs. Patient appears nontoxic. Unlikely to be pneumonia. Given that he is immunosuppressed on chronic prednisone and methotrexate I discussed treating him with an antibiotic at this time given likely increased susceptibility to infection. We'll treat with doxycycline given patient's myasthenia gravis precludes treatment with azithromycin. Hycodan for cough. Advised to monitor for drowsiness on this and respiratory issues. Patient will continue to monitor. He is given return precautions.

## 2015-11-27 NOTE — Patient Instructions (Signed)
Nice to meet you. You likely a bronchitis. We will treat you with doxycycline. You should continue your current prednisone dosage. You can use the Hycodan for cough. If you develop chest pain, shortness of breath, cough productive of blood, or fevers please seek medical attention.

## 2016-03-31 ENCOUNTER — Encounter: Payer: Self-pay | Admitting: Internal Medicine

## 2016-04-01 NOTE — Telephone Encounter (Signed)
This message should have been handled by whoever is previewing messages.  Please call patient and get details of the "bl;ip" of pain

## 2016-04-05 ENCOUNTER — Telehealth: Payer: Self-pay | Admitting: Internal Medicine

## 2016-04-05 DIAGNOSIS — E785 Hyperlipidemia, unspecified: Secondary | ICD-10-CM

## 2016-04-05 DIAGNOSIS — Z79899 Other long term (current) drug therapy: Secondary | ICD-10-CM

## 2016-04-05 NOTE — Telephone Encounter (Signed)
[facility]  Patient Name: Frank Cabrera  DOB: 09/11/1966    Initial Comment Caller states he has had some chest pains off and on through out the week. Every two or three days   Nurse Assessment  Nurse: Alvis Lemmings, RN, Marcie Bal Date/Time Eilene Ghazi Time): 04/05/2016 4:25:36 PM  Confirm and document reason for call. If symptomatic, describe symptoms. You must click the next button to save text entered. ---Caller states he has had some chest pains off and on through out the week. Every two or three days. Last time a week ago. Feels like an "impulse" of pain toward the left.  Has the patient traveled out of the country within the last 30 days? ---No  Does the patient have any new or worsening symptoms? ---Yes  Will a triage be completed? ---Yes  Related visit to physician within the last 2 weeks? ---No  Does the PT have any chronic conditions? (i.e. diabetes, asthma, etc.) ---Yes  List chronic conditions. ---myasthenia gravis, prednisone, methotrexate,  Is this a behavioral health or substance abuse call? ---No     Guidelines    Guideline Title Affirmed Question Affirmed Notes  Chest Pain [1] Intermittent chest pain or "angina" AND [2] increasing in severity or frequency (Exception: pains lasting a few seconds)    Final Disposition User   Go to ED Now Alvis Lemmings, RN, Marcie Bal    Comments  Dr Vanita Ingles appt soon July 5th. Had the s/s about 10 yrs ago.   Referrals  GO TO FACILITY REFUSED   Disagree/Comply: Disagree  Disagree/Comply Reason: Wait and see

## 2016-04-05 NOTE — Telephone Encounter (Signed)
Patient has an appt on Wednesday, needs labs for Dr. Nadara Mustard, a CBC and Liver tests.  Please advise for additional ones and order. thanks

## 2016-04-05 NOTE — Telephone Encounter (Signed)
Pt called needing to sch lab work. Need order please and thank you!  Call pt @ [phone]. Thank you!

## 2016-04-05 NOTE — Telephone Encounter (Signed)
Left sided chest pain, denies pain radiating , denies nausea , stated pain last 2-3 seconds and is sharp in character. Patient did refuse appointment today, and patient was advised any worsening symptoms to go to the ER facility will be  closed for the 04/06/16

## 2016-04-05 NOTE — Telephone Encounter (Signed)
Spoke with patient.  He has been having chest pains for two months, no triggers, random. They last for 1-2 seconds. Only on the Left side.  It goes away before any other symptoms arise.  He has an appt with you on Wednesday.  He is not concerned about this just wanted to let you know.  Please advise. Thanks

## 2016-04-07 ENCOUNTER — Ambulatory Visit (INDEPENDENT_AMBULATORY_CARE_PROVIDER_SITE_OTHER): Payer: 59 | Admitting: Internal Medicine

## 2016-04-07 ENCOUNTER — Encounter: Payer: Self-pay | Admitting: Internal Medicine

## 2016-04-07 VITALS — BP 104/68 | HR 67 | Temp 97.9°F | Resp 12 | Ht 66.0 in | Wt 157.8 lb

## 2016-04-07 DIAGNOSIS — T380X5A Adverse effect of glucocorticoids and synthetic analogues, initial encounter: Secondary | ICD-10-CM

## 2016-04-07 DIAGNOSIS — G2581 Restless legs syndrome: Secondary | ICD-10-CM

## 2016-04-07 DIAGNOSIS — R079 Chest pain, unspecified: Secondary | ICD-10-CM

## 2016-04-07 DIAGNOSIS — Z79899 Other long term (current) drug therapy: Secondary | ICD-10-CM

## 2016-04-07 DIAGNOSIS — R0789 Other chest pain: Secondary | ICD-10-CM

## 2016-04-07 DIAGNOSIS — M818 Other osteoporosis without current pathological fracture: Secondary | ICD-10-CM | POA: Diagnosis not present

## 2016-04-07 LAB — COMPREHENSIVE METABOLIC PANEL
ALT: 19 U/L (ref 0–53)
AST: 19 U/L (ref 0–37)
Albumin: 4.1 g/dL (ref 3.5–5.2)
Alkaline Phosphatase: 43 U/L (ref 39–117)
BUN: 13 mg/dL (ref 6–23)
CO2: 33 mEq/L — ABNORMAL HIGH (ref 19–32)
Calcium: 9.7 mg/dL (ref 8.4–10.5)
Chloride: 103 mEq/L (ref 96–112)
Creatinine, Ser: 1 mg/dL (ref 0.40–1.50)
GFR: 84.86 mL/min (ref >60.00)
Glucose, Bld: 108 mg/dL — ABNORMAL HIGH (ref 70–99)
Potassium: 4.6 mEq/L (ref 3.5–5.1)
Sodium: 139 mEq/L (ref 135–145)
Total Bilirubin: 0.3 mg/dL (ref 0.2–1.2)
Total Protein: 6.6 g/dL (ref 6.0–8.3)

## 2016-04-07 LAB — CBC WITH DIFFERENTIAL/PLATELET
Basophils Absolute: 0 10*3/uL (ref 0.0–0.1)
Basophils Relative: 0.2 % (ref 0.0–3.0)
Eosinophils Absolute: 0 10*3/uL (ref 0.0–0.7)
Eosinophils Relative: 0.1 % (ref 0.0–5.0)
HCT: 46.4 % (ref 39.0–52.0)
Hemoglobin: 15.4 g/dL (ref 13.0–17.0)
Lymphocytes Relative: 9.2 % — ABNORMAL LOW (ref 12.0–46.0)
Lymphs Abs: 0.9 10*3/uL (ref 0.7–4.0)
MCHC: 33.2 g/dL (ref 30.0–36.0)
MCV: 88.1 fl (ref 78.0–100.0)
Monocytes Absolute: 0.3 10*3/uL (ref 0.1–1.0)
Monocytes Relative: 2.8 % — ABNORMAL LOW (ref 3.0–12.0)
Neutro Abs: 8.9 10*3/uL — ABNORMAL HIGH (ref 1.4–7.7)
Neutrophils Relative %: 87.7 % — ABNORMAL HIGH (ref 43.0–77.0)
Platelets: 314 10*3/uL (ref 150.0–400.0)
RBC: 5.26 Mil/uL (ref 4.22–5.81)
RDW: 14.3 % (ref 11.5–15.5)
WBC: 10.2 10*3/uL (ref 4.0–10.5)

## 2016-04-07 MED ORDER — PRAMIPEXOLE DIHYDROCHLORIDE 0.5 MG PO TABS: 0.5000 mg | ORAL_TABLET | Freq: Every day | ORAL | Status: DC

## 2016-04-07 NOTE — Patient Instructions (Signed)
I have increased the pramipexole to 0.5 mg daily for your restless legs    referral to Dr Fletcher Anon for cardiac evaluation

## 2016-04-07 NOTE — Progress Notes (Signed)
Subjective:  Patient ID: Frank Cabrera, male    DOB: Aug 04, 1966  Age: 50 y.o. MRN: [id number]  CC: The primary encounter diagnosis was Chest pain, unspecified chest pain type. Diagnoses of Steroid-induced osteoporosis, Long-term use of high-risk medication, Restless leg syndrome, and Other chest pain were also pertinent to this visit.  HPI Frank Cabrera presents for recurrent episodes of left sided chest pain occurring randomly over the last 2 months.  The episodes do not occur daily, and he estimates that he has had 12 episodes total.  The chest pain has occurred at rest,   And is localized to the left supper chest ,  Over his heart. No accompanying  Symptoms of diaphoresis, jaw pain, nausea or shortness of breath. He reports  one isolated epsidoe of the same pain occuring on the right side of his chest which ocurred while walking, and one  episode of transient  right arm numbness.  The episodes last less than 10 minutes  Has not occurred while playing tennis.    last workup was over 10 years, ago,  With stress test.  Requesting repeat evaluation with Dr.  Fletcher Anon.   RLS:  Currently prescribed  Mirapex 0.375 mg qhs by neurologist  For RLS. sympotns no longer controlled on this dose.  Taking one hour before bedtime . Marland Kitchen   Outpatient Prescriptions Prior to Visit  Medication Sig Dispense Refill  . alendronate (FOSAMAX) 35 MG tablet Take 1 tablet (35 mg total) by mouth once a week. 12 tablet 1  . CALCIUM PO Take 300 mg by mouth daily before breakfast.    . Flaxseed, Linseed, (FLAXSEED OIL) 1000 MG CAPS Take by mouth daily.    . folic acid (FOLVITE) 1 MG tablet 1 tablet by mouth each day EXCEPT for the day you takes methotrexate    . methotrexate (RHEUMATREX) 15 MG tablet Take 20 mg by mouth once a week. Caution: Chemotherapy. Protect from light.    . Misc Natural Products (OSTEO BI-FLEX JOINT SHIELD PO) Take 1 tablet by mouth daily.     . Multiple Vitamin (MULTIVITAMIN) tablet Take 1 tablet by mouth  daily.    . predniSONE (DELTASONE) 5 MG tablet Take 17.5 mg by mouth daily.     . Vitamin D, Ergocalciferol, (DRISDOL) 50000 UNITS CAPS capsule Take by mouth.    . Pramipexole Dihydrochloride 0.375 MG TB24 Take by mouth at bedtime.    Marland Kitchen doxycycline (VIBRA-TABS) 100 MG tablet Take 1 tablet (100 mg total) by mouth 2 (two) times daily. (Patient not taking: Reported on 04/07/2016) 14 tablet 0  . ferrous gluconate (FERGON) 324 MG tablet Take 324 mg by mouth 3 (three) times daily with meals. Reported on 04/07/2016    . HYDROcodone-homatropine (HYCODAN) 5-1.5 MG/5ML syrup Take 5 mLs by mouth every 8 (eight) hours as needed for cough. (Patient not taking: Reported on 04/07/2016) 120 mL 0  . omeprazole (PRILOSEC) 20 MG capsule Take 20 mg by mouth 2 (two) times daily. Reported on 04/07/2016     No facility-administered medications prior to visit.    Review of Systems;  Patient denies headache, fevers, malaise, unintentional weight loss, skin rash, eye pain, sinus congestion and sinus pain, sore throat, dysphagia,  hemoptysis , cough, dyspnea, wheezing, chest pain, palpitations, orthopnea, edema, abdominal pain, nausea, melena, diarrhea, constipation, flank pain, dysuria, hematuria, urinary  Frequency, nocturia, numbness, tingling, seizures,  Focal weakness, Loss of consciousness,  Tremor, insomnia, depression, anxiety, and suicidal ideation.      Objective:  BP 104/68 mmHg  Pulse 67  Temp(Src) 97.9 F (36.6 C) (Oral)  Resp 12  Ht 5\' 6"  (1.676 m)  Wt 157 lb 12 oz (71.555 kg)  BMI 25.47 kg/m2  SpO2 97%  BP Readings from Last 3 Encounters:  04/07/16 104/68  11/27/15 102/62  04/14/15 97/62    Wt Readings from Last 3 Encounters:  04/07/16 157 lb 12 oz (71.555 kg)  11/27/15 156 lb (70.761 kg)  04/14/15 164 lb 6 oz (74.56 kg)    General appearance: alert, cooperative and appears stated age Ears: normal TM's and external ear canals both ears Throat: lips, mucosa, and tongue normal; teeth and gums  normal Neck: no adenopathy, no carotid bruit, supple, symmetrical, trachea midline and thyroid not enlarged, symmetric, no tenderness/mass/nodules Back: symmetric, no curvature. ROM normal. No CVA tenderness. Lungs: clear to auscultation bilaterally Heart: regular rate and rhythm, S1, S2 normal, no murmur, click, rub or gallop Abdomen: soft, non-tender; bowel sounds normal; no masses,  no organomegaly Pulses: 2+ and symmetric Skin: Skin color, texture, turgor normal. No rashes or lesions Lymph nodes: Cervical, supraclavicular, and axillary nodes normal.  No results found for: HGBA1C  Lab Results  Component Value Date   CREATININE 1.00 04/07/2016   CREATININE 0.87 10/01/2015   CREATININE 0.84 01/19/2012    Lab Results  Component Value Date   WBC 10.2 04/07/2016   HGB 15.4 04/07/2016   HCT 46.4 04/07/2016   PLT 314.0 04/07/2016   GLUCOSE 108* 04/07/2016   CHOL 189 10/01/2015   TRIG 76 10/01/2015   HDL 70 10/01/2015   LDLCALC 104* 10/01/2015   ALT 19 04/07/2016   AST 19 04/07/2016   NA 139 04/07/2016   K 4.6 04/07/2016   CL 103 04/07/2016   CREATININE 1.00 04/07/2016   BUN 13 04/07/2016   CO2 33* 04/07/2016    Dg Cervical Spine Complete  04/18/2015  CLINICAL DATA:  Two months of pain status post fall at work 18 months ago, no radicular symptoms, possible scoliosis. EXAM: CERVICAL SPINE  4+ VIEWS COMPARISON:  None. FINDINGS: The cervical vertebral bodies are preserved in height. The disc space heights are well maintained. The prevertebral soft tissue spaces are normal. There is no perched facet or spinous process fracture. There is mild facet joint hypertrophy at C7-T1. Evaluation of the neural foramina on the right is limited due to patient positioning. No significant neural foraminal encroachment on the left is observed. IMPRESSION: There is no compression fracture nor high-grade disc space narrowing. There is mild facet joint hypertrophy at the cervicothoracic junction.  Electronically Signed   By: David  Martinique M.D.   On: 04/18/2015 13:59   Dg Thoracic Spine W/swimmers  04/18/2015  CLINICAL DATA:  Right-sided thoracic back pain for 3 months. EXAM: THORACIC SPINE - 2 VIEW + SWIMMERS COMPARISON:  Chest x-ray dated 05/31/2012 FINDINGS: There is no fracture or bone destruction or disc space narrowing. There is no scoliosis. IMPRESSION: Negative exam. Electronically Signed   By: Lorriane Shire M.D.   On: 04/18/2015 13:59    Assessment & Plan:   Problem List Items Addressed This Visit    Restless leg syndrome    Mirapex dose increased to 0.5 mg daily       Chest pain - Primary    Atypical. History is suggestive of pectoralis muscle spasm. Likely related to his diagnosis of myasthenia gravis. However his chronic use of prednisone may increase his risk for CAD and he is requesting a repeat cardiology evaluation.  Lab Results  Component Value Date   CHOL 189 10/01/2015   HDL 70 10/01/2015   LDLCALC 104* 10/01/2015   TRIG 76 10/01/2015   CHOLHDL 2.7 10/01/2015         Relevant Orders   EKG 12-Lead (Completed)   Ambulatory referral to Cardiology   Steroid-induced osteoporosis    Last DEXA 2015.  On alendronate, calcium  and Vit D (Dr Eddie Dibbles )        Other Visit Diagnoses    Long-term use of high-risk medication           I have discontinued Mr. Verma Pramipexole Dihydrochloride. I am also having him start on pramipexole. Additionally, I am having him maintain his ferrous gluconate, methotrexate, Flaxseed Oil, multivitamin, Misc Natural Products (OSTEO BI-FLEX JOINT SHIELD PO), predniSONE, omeprazole, CALCIUM PO, Vitamin D (Ergocalciferol), folic acid, alendronate, doxycycline, and HYDROcodone-homatropine.  Meds ordered this encounter  Medications  . pramipexole (MIRAPEX) 0.5 MG tablet    Sig: Take 1 tablet (0.5 mg total) by mouth at bedtime.    Dispense:  30 tablet    Refill:  2    Medications Discontinued During This Encounter  Medication  Reason  . Pramipexole Dihydrochloride 0.375 MG TB24     Follow-up: No Follow-up on file.   [name], MD

## 2016-04-07 NOTE — Assessment & Plan Note (Signed)
Last DEXA 2015.  On alendronate, calcium  and Vit D (Dr Eddie Dibbles )

## 2016-04-07 NOTE — Telephone Encounter (Signed)
Labs ordered.

## 2016-04-07 NOTE — Progress Notes (Signed)
Pre-visit discussion using our clinic review tool. No additional management support is needed unless otherwise documented below in the visit note.  

## 2016-04-08 ENCOUNTER — Encounter: Payer: Self-pay | Admitting: Internal Medicine

## 2016-04-08 DIAGNOSIS — R079 Chest pain, unspecified: Secondary | ICD-10-CM | POA: Insufficient documentation

## 2016-04-08 NOTE — Assessment & Plan Note (Signed)
Atypical. History is suggestive of pectoralis muscle spasm. Likely related to his diagnosis of myasthenia gravis. However his chronic use of prednisone may increase his risk for CAD and he is requesting a repeat cardiology evaluation.  Lab Results  Component Value Date   CHOL 189 10/01/2015   HDL 70 10/01/2015   LDLCALC 104* 10/01/2015   TRIG 76 10/01/2015   CHOLHDL 2.7 10/01/2015

## 2016-04-08 NOTE — Assessment & Plan Note (Signed)
Mirapex dose increased to 0.5 mg daily

## 2016-04-21 ENCOUNTER — Encounter: Payer: Self-pay | Admitting: Internal Medicine

## 2016-05-10 ENCOUNTER — Encounter: Payer: Self-pay | Admitting: Cardiovascular Disease

## 2016-05-10 ENCOUNTER — Ambulatory Visit (INDEPENDENT_AMBULATORY_CARE_PROVIDER_SITE_OTHER): Payer: 59 | Admitting: Cardiovascular Disease

## 2016-05-10 VITALS — BP 110/62 | HR 75 | Ht 66.0 in | Wt 156.8 lb

## 2016-05-10 DIAGNOSIS — R079 Chest pain, unspecified: Secondary | ICD-10-CM | POA: Diagnosis not present

## 2016-05-10 NOTE — Patient Instructions (Addendum)
Medication Instructions:  Your physician recommends that you continue on your current medications as directed. Please refer to the Current Medication list given to you today.   Labwork: none  Testing/Procedures: Your physician has requested that you have an echocardiogram. Echocardiography is a painless test that uses sound waves to create images of your heart. It provides your doctor with information about the size and shape of your heart and how well your heart's chambers and valves are working. This procedure takes approximately one hour. There are no restrictions for this procedure.  Your physician has requested that you have an exercise tolerance test. For further information please visit [email or web address]. Please also follow instruction sheet, as given.    Follow-Up: Your physician recommends that you schedule a follow-up appointment as needed.   Any Other Special Instructions Will Be Listed Below (If Applicable).     If you need a refill on your cardiac medications before your next appointment, please call your pharmacy.  sEchocardiogram An echocardiogram, or echocardiography, uses sound waves (ultrasound) to produce an image of your heart. The echocardiogram is simple, painless, obtained within a short period of time, and offers valuable information to your health care provider. The images from an echocardiogram can provide information such as:  Evidence of coronary artery disease (CAD).  Heart size.  Heart muscle function.  Heart valve function.  Aneurysm detection.  Evidence of a past heart attack.  Fluid buildup around the heart.  Heart muscle thickening.  Assess heart valve function. LET [facility] CARE PROVIDER KNOW ABOUT:  Any allergies you have.  All medicines you are taking, including vitamins, herbs, eye drops, creams, and over-the-counter medicines.  Previous problems you or members of your family have had with the use of anesthetics.  Any  blood disorders you have.  Previous surgeries you have had.  Medical conditions you have.  Possibility of pregnancy, if this applies. BEFORE THE PROCEDURE  No special preparation is needed. Eat and drink normally.  PROCEDURE   In order to produce an image of your heart, gel will be applied to your chest and a wand-like tool (transducer) will be moved over your chest. The gel will help transmit the sound waves from the transducer. The sound waves will harmlessly bounce off your heart to allow the heart images to be captured in real-time motion. These images will then be recorded.  You may need an IV to receive a medicine that improves the quality of the pictures. AFTER THE PROCEDURE You may return to your normal schedule including diet, activities, and medicines, unless your health care provider tells you otherwise.   This information is not intended to replace advice given to you by your health care provider. Make sure you discuss any questions you have with your health care provider.   Document Released: 09/17/2000 Document Revised: 10/11/2014 Document Reviewed: 05/28/2013 Elsevier Interactive Patient Education 2016 [employer].  Exercise Stress Electrocardiogram An exercise stress electrocardiogram is a test that is done to evaluate the blood supply to your heart. This test may also be called exercise stress electrocardiography. The test is done while you are walking on a treadmill. The goal of this test is to raise your heart rate. This test is done to find areas of poor blood flow to the heart by determining the extent of coronary artery disease (CAD).   CAD is defined as narrowing in one or more heart (coronary) arteries of more than 70%. If you have an abnormal test result, this may mean that  you are not getting adequate blood flow to your heart during exercise. Additional testing may be needed to understand why your test was abnormal. LET [facility] CARE PROVIDER KNOW ABOUT:    Any allergies you have.  All medicines you are taking, including vitamins, herbs, eye drops, creams, and over-the-counter medicines.  Previous problems you or members of your family have had with the use of anesthetics.  Any blood disorders you have.  Previous surgeries you have had.  Medical conditions you have.  Possibility of pregnancy, if this applies. RISKS AND COMPLICATIONS Generally, this is a safe procedure. However, as with any procedure, complications can occur. Possible complications can include:  Pain or pressure in the following areas:  Chest.  Jaw or neck.  Between your shoulder blades.  Radiating down your left arm.  Dizziness or light-headedness.  Shortness of breath.  Increased or irregular heartbeats.  Nausea or vomiting.  Heart attack (rare). BEFORE THE PROCEDURE  Avoid all forms of caffeine 24 hours before your test or as directed by your health care provider. This includes coffee, tea (even decaffeinated tea), caffeinated sodas, chocolate, cocoa, and certain pain medicines.  Follow your health care provider's instructions regarding eating and drinking before the test.  Take your medicines as directed at regular times with water unless instructed otherwise. Exceptions may include:  If you have diabetes, ask how you are to take your insulin or pills. It is common to adjust insulin dosing the morning of the test.  If you are taking beta-blocker medicines, it is important to talk to your health care provider about these medicines well before the date of your test. Taking beta-blocker medicines may interfere with the test. In some cases, these medicines need to be changed or stopped 24 hours or more before the test.  If you wear a nitroglycerin patch, it may need to be removed prior to the test. Ask your health care provider if the patch should be removed before the test.  If you use an inhaler for any breathing condition, bring it with you to the  test.  If you are an outpatient, bring a snack so you can eat right after the stress phase of the test.  Do not smoke for 4 hours prior to the test or as directed by your health care provider.  Do not apply lotions, powders, creams, or oils on your chest prior to the test.  Wear loose-fitting clothes and comfortable shoes for the test. This test involves walking on a treadmill. PROCEDURE  Multiple patches (electrodes) will be put on your chest. If needed, small areas of your chest may have to be shaved to get better contact with the electrodes. Once the electrodes are attached to your body, multiple wires will be attached to the electrodes and your heart rate will be monitored.  Your heart will be monitored both at rest and while exercising.  You will walk on a treadmill. The treadmill will be started at a slow pace. The treadmill speed and incline will gradually be increased to raise your heart rate. AFTER THE PROCEDURE  Your heart rate and blood pressure will be monitored after the test.  You may return to your normal schedule including diet, activities, and medicines, unless your health care provider tells you otherwise.   This information is not intended to replace advice given to you by your health care provider. Make sure you discuss any questions you have with your health care provider.   Document Released: 09/17/2000 Document Revised:  09/25/2013 Document Reviewed: 05/28/2013 Elsevier Interactive Patient Education [employer].

## 2016-05-10 NOTE — Progress Notes (Signed)
Cardiology Office Note   Date:  05/10/2016   ID:  Frank Cabrera, DOB Feb 29, 1966, MRN [id number]  PCP:  [name], MD  Cardiologist:   Kathlyn Sacramento, MD   Chief Complaint  Patient presents with  . Other    Ref by Dr. Derrel Nip for chest pain. Pt. c/o chest pain for about 2-3 weeks in a row but hasn't had any since then.       History of Present Illness: Frank Cabrera is a 50 y.o. male who was referred by Dr. Derrel Nip for evaluation of chest pain. The patient has no previous cardiac history and he reports undergoing a stress test about 10 years ago which was unremarkable. He has known history of myasthenia gravis currently being treated with methotrexate and prednisone. He has been on steroids for many years. He has no history of diabetes, hypertension or hyperlipidemia. He is not a smoker. There is family history of atrial fibrillation but no coronary artery disease. He reports intermittent episodes of chest pain described as aching pulsating sensation typically on the left side of the chest lasting for about 5-10 seconds. These episodes happen at rest and usually not with physical activities. When he plays tennis, he has mild exertional dyspnea without chest discomfort. No orthopnea, PND or leg edema. No dizziness, syncope or presyncope. The pain sometimes goes to the right side of the chest.    Past Medical History:  Diagnosis Date  . Herpes simplex type II infection 2003  . Myasthenia gravis associated with thymoma ([place])   . Restless leg syndrome     Past Surgical History:  Procedure Laterality Date  . EYE MUSCLE SURGERY  2008   for strabismus, [facility],  [employer]  . SEPTOPLASTY  March 2013  . STRABISMUS SURGERY  2006  . TOTAL THYMECTOMY  1995     Current Outpatient Prescriptions  Medication Sig Dispense Refill  . alendronate (FOSAMAX) 35 MG tablet Take 1 tablet (35 mg total) by mouth once a week. 12 tablet 1  . CALCIUM PO Take 500 mg by mouth daily before breakfast.     .  Flaxseed, Linseed, (FLAXSEED OIL) 1000 MG CAPS Take by mouth daily.    . folic acid (FOLVITE) 1 MG tablet 1 tablet by mouth each day EXCEPT for the day you takes methotrexate    . methotrexate (RHEUMATREX) 15 MG tablet Take 20 mg by mouth once a week. Caution: Chemotherapy. Protect from light.    . Misc Natural Products (OSTEO BI-FLEX JOINT SHIELD PO) Take 1 tablet by mouth daily.     . Multiple Vitamin (MULTIVITAMIN) tablet Take 1 tablet by mouth daily.    . pramipexole (MIRAPEX) 0.5 MG tablet Take 1 tablet (0.5 mg total) by mouth at bedtime. 30 tablet 2  . predniSONE (DELTASONE) 5 MG tablet Take 17.5 mg by mouth daily.     . Vitamin D, Ergocalciferol, (DRISDOL) 50000 UNITS CAPS capsule Take 50,000 Units by mouth every 7 (seven) days.      No current facility-administered medications for this visit.     Allergies:   Cefadroxil    Social History:  The patient  reports that he has never smoked. He has never used smokeless tobacco. He reports that he does not drink alcohol or use drugs.   Family History:  The patient's family history includes Cancer in his maternal grandmother and maternal uncle; Heart disease in his father and mother.    ROS:  Please see the history of present  illness.   Otherwise, review of systems are positive for none.   All other systems are reviewed and negative.    PHYSICAL EXAM: VS:  BP 110/62 (BP Location: Left Arm, Patient Position: Sitting, Cuff Size: Normal)   Pulse 75   Ht 5\' 6"  (1.676 m)   Wt 156 lb 12 oz (71.1 kg)   BMI 25.30 kg/m  , BMI Body mass index is 25.3 kg/m. GEN: Well nourished, well developed, in no acute distress  HEENT: normal  Neck: no JVD, carotid bruits, or masses Cardiac: RRR; no murmurs, rubs, or gallops,no edema  Respiratory:  clear to auscultation bilaterally, normal work of breathing GI: soft, nontender, nondistended, + BS MS: no deformity or atrophy  Skin: warm and dry, no rash Neuro:  Strength and sensation are intact Psych:  euthymic mood, full affect   EKG:  EKG is not ordered today. Recent EKG was reviewed which showed sinus rhythm with possible old septal infarct.   Recent Labs: 04/07/2016: ALT 19; BUN 13; Creatinine, Ser 1.00; Hemoglobin 15.4; Platelets 314.0; Potassium 4.6; Sodium 139    Lipid Panel    Component Value Date/Time   CHOL 189 10/01/2015 1025   TRIG 76 10/01/2015 1025   HDL 70 10/01/2015 1025   CHOLHDL 2.7 10/01/2015 1025   LDLCALC 104 (H) 10/01/2015 1025      Wt Readings from Last 3 Encounters:  05/10/16 156 lb 12 oz (71.1 kg)  04/07/16 157 lb 12 oz (71.6 kg)  11/27/15 156 lb (70.8 kg)       ASSESSMENT AND PLAN:  1.  Atypical chest pain: The chest pain is overall atypical and suggestive of a musculoskeletal etiology. However,  chronic inflammatory diseases can increase the risk of heart disease the patient has been on methotrexate and prednisone for many years. His baseline ECG is slightly abnormal with possible old septal infarct but there is possible lead misplacement. I recommend evaluation with a treadmill stress test and an echocardiogram to ensure no structural heart abnormalities.  2. Myasthenia gravis: Symptoms are controlled with methotrexate and prednisone.  Disposition:   FU with me as needed.   Signed,  Kathlyn Sacramento, MD  05/10/2016 3:42 PM    [facility]

## 2016-05-14 ENCOUNTER — Telehealth: Payer: Self-pay | Admitting: Cardiovascular Disease

## 2016-05-14 NOTE — Telephone Encounter (Signed)
S/w pt who confirms he would like to cancel GXT and echo unless he has further chest pain. Assured pt tests have been cancelled and advised him to continue to monitor sx and call if we may be of any further assistance. Pt verbalized understanding w/no questions at this time.

## 2016-05-14 NOTE — Telephone Encounter (Signed)
Patient wants to cancel all testing unless or until he has further chest pain .  All upcoming appointments cancelled.

## 2016-05-25 ENCOUNTER — Other Ambulatory Visit: Payer: 59

## 2016-06-20 ENCOUNTER — Other Ambulatory Visit: Payer: Self-pay | Admitting: Internal Medicine

## 2016-06-28 ENCOUNTER — Telehealth: Payer: Self-pay | Admitting: *Deleted

## 2016-06-28 NOTE — Telephone Encounter (Signed)
Why does the patient want hep A has he been exposed?

## 2016-06-28 NOTE — Telephone Encounter (Signed)
What would she like to do there [place] no immunization history for this pt in [facility].

## 2016-06-28 NOTE — Telephone Encounter (Signed)
Patient requested to have a Hep A vaccine, please advise if its appropriate to schedule pt  Pt contact [phone]

## 2016-06-29 NOTE — Telephone Encounter (Signed)
Pt was called and he stated that the Hep A is needed for going to [place] and is scheduled tomorrow 06/30/16 at 8 a.m. Pt could only do a 8 a.m. appt.

## 2016-06-29 NOTE — Telephone Encounter (Signed)
Please schedule the patient for nurse visit to receive  the Hepatitis A vaccine as requested.

## 2016-06-29 NOTE — Telephone Encounter (Signed)
LVTCB

## 2016-06-29 NOTE — Telephone Encounter (Signed)
Pt called returning your call regarding an injection.  Call pt @ [phone]

## 2016-06-30 ENCOUNTER — Ambulatory Visit (INDEPENDENT_AMBULATORY_CARE_PROVIDER_SITE_OTHER): Payer: 59

## 2016-06-30 DIAGNOSIS — Z23 Encounter for immunization: Secondary | ICD-10-CM | POA: Diagnosis not present

## 2016-06-30 NOTE — Progress Notes (Signed)
Patient came to [place] to receive  Hep A inoculation shot.  Patient receive the immunization shot in Left deltoid.  Patient tolerate shot well, and had no questions, comment, or concerns.

## 2016-07-04 ENCOUNTER — Encounter: Payer: Self-pay | Admitting: Internal Medicine

## 2016-07-07 MED ORDER — PRAMIPEXOLE DIHYDROCHLORIDE 0.5 MG PO TABS: 1.0000 mg | ORAL_TABLET | Freq: Every day | ORAL | 2 refills | Status: DC

## 2016-07-07 NOTE — Progress Notes (Signed)
  I have reviewed the above information and agree with above.   Calel Pisarski, MD 

## 2016-09-29 ENCOUNTER — Ambulatory Visit (INDEPENDENT_AMBULATORY_CARE_PROVIDER_SITE_OTHER): Payer: 59 | Admitting: Family Medicine

## 2016-09-29 ENCOUNTER — Encounter: Payer: Self-pay | Admitting: Family Medicine

## 2016-09-29 DIAGNOSIS — G8929 Other chronic pain: Secondary | ICD-10-CM

## 2016-09-29 DIAGNOSIS — R1031 Right lower quadrant pain: Secondary | ICD-10-CM

## 2016-09-29 NOTE — Patient Instructions (Signed)
Give [place] a call if it continues to bother you and we will arrange the CT.  Take care  Dr. Lacinda Axon

## 2016-09-29 NOTE — Progress Notes (Signed)
   Subjective:  Patient ID: Frank Cabrera, male    DOB: 1966/04/23  Age: 50 y.o. MRN: [id number]  CC: ? Hernia  HPI:  50 year old male presents with complaints of right-sided lower abdominal pain and a recent bulge concerning for hernia.  Patient states that over the past 3 months she's had some pain in his right lower abdomen. Pain is just lateral to the rectus abdominis. Pain is mild to moderate. Intermittent. Approximately 2 weeks ago he coughed and felt a bulge in that area. Pain was severe at that time. Bulge slowly improved. Patient denies any associated bowel issues. No fevers or chills. No nausea or vomiting. No other associated symptoms. No known relieving factors. No other complaints or concerns at this time.  Social Hx   Social History   Social History  . Marital status: Married    Spouse name: N/A  . Number of children: N/A  . Years of education: N/A   Social History Main Topics  . Smoking status: Never Smoker  . Smokeless tobacco: Never Used  . Alcohol use No  . Drug use: No  . Sexual activity: Yes   Other Topics Concern  . None   Social History Narrative  . None    Review of Systems  Constitutional: Negative.   Gastrointestinal: Positive for abdominal pain.       ? Hernia.   Objective:  BP 96/64   Pulse (!) 52   Temp 97.5 F (36.4 C) (Oral)   Resp 12   Wt 163 lb 4 oz (74 kg)   SpO2 100%   BMI 26.35 kg/m   BP/Weight 09/29/2016 0000000 [id number]  Systolic BP 96 [id number]  Diastolic BP 64 62 68  Wt. (Lbs) 163.25 156.75 157.75  BMI 26.35 25.3 25.47   Physical Exam  Constitutional: He is oriented to person, place, and time. He appears well-developed. No distress.  Pulmonary/Chest: Effort normal and breath sounds normal.  Abdominal:  Soft, nondistended. No palpable bulge. Patient's tenderness is lateral to the rectus abdominis (RLQ).  Neurological: He is alert and oriented to person, place, and time.  Psychiatric: He has a normal mood and affect.    Vitals reviewed.  Lab Results  Component Value Date   WBC 10.2 04/07/2016   HGB 15.4 04/07/2016   HCT 46.4 04/07/2016   PLT 314.0 04/07/2016   GLUCOSE 108 (H) 04/07/2016   CHOL 189 10/01/2015   TRIG 76 10/01/2015   HDL 70 10/01/2015   LDLCALC 104 (H) 10/01/2015   ALT 19 04/07/2016   AST 19 04/07/2016   NA 139 04/07/2016   K 4.6 04/07/2016   CL 103 04/07/2016   CREATININE 1.00 04/07/2016   BUN 13 04/07/2016   CO2 33 (H) 04/07/2016   Assessment & Plan:   Problem List Items Addressed This Visit    Abdominal pain, chronic, right lower quadrant    New problem.  Concern for spigelian hernia. Discussed CT, general surgery referral and watchful waiting. Patient elected for watchful waiting. PRN Tylenol.         Follow-up: PRN  [place]

## 2016-09-29 NOTE — Progress Notes (Signed)
Pre visit review using our clinic review tool, if applicable. No additional management support is needed unless otherwise documented below in the visit note. 

## 2016-09-29 NOTE — Assessment & Plan Note (Addendum)
New problem.  Concern for spigelian hernia. Discussed CT, general surgery referral and watchful waiting. Patient elected for watchful waiting. PRN Tylenol.

## 2016-12-09 ENCOUNTER — Other Ambulatory Visit: Payer: Self-pay | Admitting: Internal Medicine

## 2016-12-21 DIAGNOSIS — M810 Age-related osteoporosis without current pathological fracture: Secondary | ICD-10-CM | POA: Diagnosis not present

## 2016-12-28 ENCOUNTER — Encounter: Payer: Self-pay | Admitting: Internal Medicine

## 2016-12-28 DIAGNOSIS — Z7952 Long term (current) use of systemic steroids: Secondary | ICD-10-CM | POA: Diagnosis not present

## 2016-12-28 DIAGNOSIS — T380X5A Adverse effect of glucocorticoids and synthetic analogues, initial encounter: Secondary | ICD-10-CM | POA: Diagnosis not present

## 2016-12-28 DIAGNOSIS — Z79899 Other long term (current) drug therapy: Secondary | ICD-10-CM

## 2016-12-28 DIAGNOSIS — M818 Other osteoporosis without current pathological fracture: Secondary | ICD-10-CM | POA: Diagnosis not present

## 2017-01-11 ENCOUNTER — Other Ambulatory Visit (INDEPENDENT_AMBULATORY_CARE_PROVIDER_SITE_OTHER): Payer: 59

## 2017-01-11 DIAGNOSIS — Z79899 Other long term (current) drug therapy: Secondary | ICD-10-CM

## 2017-01-11 LAB — CBC WITH DIFFERENTIAL/PLATELET
Basophils Absolute: 0 10*3/uL (ref 0.0–0.1)
Basophils Relative: 0.3 % (ref 0.0–3.0)
Eosinophils Absolute: 0 10*3/uL (ref 0.0–0.7)
Eosinophils Relative: 0.3 % (ref 0.0–5.0)
HCT: 44.4 % (ref 39.0–52.0)
Hemoglobin: 15.1 g/dL (ref 13.0–17.0)
Lymphocytes Relative: 9.7 % — ABNORMAL LOW (ref 12.0–46.0)
Lymphs Abs: 1 10*3/uL (ref 0.7–4.0)
MCHC: 34.1 g/dL (ref 30.0–36.0)
MCV: 87.3 fl (ref 78.0–100.0)
Monocytes Absolute: 0.3 10*3/uL (ref 0.1–1.0)
Monocytes Relative: 2.8 % — ABNORMAL LOW (ref 3.0–12.0)
Neutro Abs: 8.6 10*3/uL — ABNORMAL HIGH (ref 1.4–7.7)
Neutrophils Relative %: 86.9 % — ABNORMAL HIGH (ref 43.0–77.0)
Platelets: 308 10*3/uL (ref 150.0–400.0)
RBC: 5.09 Mil/uL (ref 4.22–5.81)
RDW: 14.5 % (ref 11.5–15.5)
WBC: 9.8 10*3/uL (ref 4.0–10.5)

## 2017-01-11 LAB — COMPREHENSIVE METABOLIC PANEL
ALT: 18 U/L (ref 0–53)
AST: 19 U/L (ref 0–37)
Albumin: 4 g/dL (ref 3.5–5.2)
Alkaline Phosphatase: 35 U/L — ABNORMAL LOW (ref 39–117)
BUN: 15 mg/dL (ref 6–23)
CO2: 32 mEq/L (ref 19–32)
Calcium: 9.8 mg/dL (ref 8.4–10.5)
Chloride: 103 mEq/L (ref 96–112)
Creatinine, Ser: 0.96 mg/dL (ref 0.40–1.50)
GFR: 88.67 mL/min (ref >60.00)
Glucose, Bld: 100 mg/dL — ABNORMAL HIGH (ref 70–99)
Potassium: 4.4 mEq/L (ref 3.5–5.1)
Sodium: 139 mEq/L (ref 135–145)
Total Bilirubin: 0.4 mg/dL (ref 0.2–1.2)
Total Protein: 6.5 g/dL (ref 6.0–8.3)

## 2017-01-14 ENCOUNTER — Encounter: Payer: Self-pay | Admitting: Internal Medicine

## 2017-01-17 NOTE — Telephone Encounter (Signed)
Patient would like lab results for warded to [facility] Dr. Nadara Mustard.

## 2017-01-31 ENCOUNTER — Ambulatory Visit (INDEPENDENT_AMBULATORY_CARE_PROVIDER_SITE_OTHER): Payer: 59 | Admitting: Family Medicine

## 2017-01-31 ENCOUNTER — Encounter: Payer: Self-pay | Admitting: Family Medicine

## 2017-01-31 ENCOUNTER — Ambulatory Visit: Payer: 59 | Admitting: Family

## 2017-01-31 DIAGNOSIS — M779 Enthesopathy, unspecified: Principal | ICD-10-CM

## 2017-01-31 DIAGNOSIS — M7750 Other enthesopathy of unspecified foot: Secondary | ICD-10-CM

## 2017-01-31 DIAGNOSIS — M778 Other enthesopathies, not elsewhere classified: Secondary | ICD-10-CM

## 2017-01-31 NOTE — Patient Instructions (Signed)
Nice to see you. You likely strained a tendon in your foot. You can do ice in this area 2 to 3 times a day for 10-15 minutes at a time. You may also take ibuprofen 600 mg every 8 hours for the next 4-5 days and then as needed. You may do the stretch we discussed as well. If you develop any worsening symptoms or symptoms do not improve please let us know.

## 2017-01-31 NOTE — Assessment & Plan Note (Signed)
Suspect extensor tendinitis of his left foot as a cause of his pain particularly given the discomfort when flexing his toes. He's had no increased activities to indicate a stress fracture. I did discuss obtaining an x-ray though did discuss that I'm not sure this would reveal anything to [place] at this time. He and I both opted against this. Discussed icing and ibuprofen use. Scheduled ibuprofen use for the next 4-5 days and then as needed. He'll take this with food. Discussed stretching for this as well. He'll monitor and if not improving he'll let us know to consider sports medicine referral versus imaging.

## 2017-01-31 NOTE — Progress Notes (Signed)
  Tommi Rumps, MD Phone: [phone]  Frank Cabrera is a 51 y.o. male who presents today for same-day visit.  Patient notes left foot dorsum discomfort for some time now. Has become more persistent recently. Notes there has been minor swelling. He is able to point to a specific point that it hurts over the third metatarsal. Notes he is on his feet all the time at work walking around. He notes no specific injury. He thought it was the shoe initially though he has change this and it has not improved. Notes no new increases in activity. He does not run. He notes he rested this week and that has helped some.  ROS see history of present illness  Objective  Physical Exam Vitals:   01/31/17 0805  BP: 100/68  Pulse: (!) 55  Temp: 97.7 F (36.5 C)    BP Readings from Last 3 Encounters:  01/31/17 100/68  09/29/16 96/64  05/10/16 110/62   Wt Readings from Last 3 Encounters:  01/31/17 169 lb (76.7 kg)  09/29/16 163 lb 4 oz (74 kg)  05/10/16 156 lb 12 oz (71.1 kg)    Physical Exam  Constitutional: No distress.  Pulmonary/Chest: Effort normal.  Musculoskeletal:  Left foot dorsum with minimal swelling, there is mild tenderness over the extensor portion of the third metatarsal midportion, no tenderness elsewhere along the metatarsal shaft, no tenderness elsewhere in the dorsum of the foot, he has discomfort on forced flexion of the toes in that area, no ankle tenderness, no fifth metatarsal or navicular tenderness, 2+ DP pulse, sensation to light touch intact left foot, right foot with no swelling or tenderness, 2+ DP pulse, sensation light touch intact right foot  Neurological: He is alert. Gait normal.  Skin: He is not diaphoretic.     Assessment/Plan: Please see individual problem list.  Extensor tendinitis of foot Suspect extensor tendinitis of his left foot as a cause of his pain particularly given the discomfort when flexing his toes. He's had no increased activities to indicate  a stress fracture. I did discuss obtaining an x-ray though did discuss that I'm not sure this would reveal anything to [place] at this time. He and I both opted against this. Discussed icing and ibuprofen use. Scheduled ibuprofen use for the next 4-5 days and then as needed. He'll take this with food. Discussed stretching for this as well. He'll monitor and if not improving he'll let us know to consider sports medicine referral versus imaging.  Tommi Rumps, MD [place]

## 2017-02-28 ENCOUNTER — Encounter: Payer: Self-pay | Admitting: Internal Medicine

## 2017-03-01 ENCOUNTER — Other Ambulatory Visit: Payer: Self-pay | Admitting: Internal Medicine

## 2017-03-01 DIAGNOSIS — E559 Vitamin D deficiency, unspecified: Secondary | ICD-10-CM

## 2017-03-04 ENCOUNTER — Other Ambulatory Visit (INDEPENDENT_AMBULATORY_CARE_PROVIDER_SITE_OTHER): Payer: 59

## 2017-03-04 DIAGNOSIS — E559 Vitamin D deficiency, unspecified: Secondary | ICD-10-CM

## 2017-03-04 NOTE — Addendum Note (Signed)
Addended by: Arby Barrette on: 03/04/2017 12:47 PM   Modules accepted: Orders

## 2017-03-05 LAB — VITAMIN D 25 HYDROXY (VIT D DEFICIENCY, FRACTURES): Vit D, 25-Hydroxy: 38 ng/mL (ref 30–100)

## 2017-03-06 ENCOUNTER — Encounter: Payer: Self-pay | Admitting: Internal Medicine

## 2017-03-16 DIAGNOSIS — G7000 Myasthenia gravis without (acute) exacerbation: Secondary | ICD-10-CM | POA: Diagnosis not present

## 2017-03-18 DIAGNOSIS — G7000 Myasthenia gravis without (acute) exacerbation: Secondary | ICD-10-CM | POA: Diagnosis not present

## 2017-03-21 DIAGNOSIS — Z79899 Other long term (current) drug therapy: Secondary | ICD-10-CM | POA: Diagnosis not present

## 2017-03-21 DIAGNOSIS — G7000 Myasthenia gravis without (acute) exacerbation: Secondary | ICD-10-CM | POA: Diagnosis not present

## 2017-03-23 DIAGNOSIS — G7000 Myasthenia gravis without (acute) exacerbation: Secondary | ICD-10-CM | POA: Diagnosis not present

## 2017-03-25 DIAGNOSIS — G7000 Myasthenia gravis without (acute) exacerbation: Secondary | ICD-10-CM | POA: Diagnosis not present

## 2017-04-05 DIAGNOSIS — G7000 Myasthenia gravis without (acute) exacerbation: Secondary | ICD-10-CM | POA: Diagnosis not present

## 2017-05-12 DIAGNOSIS — Z79899 Other long term (current) drug therapy: Secondary | ICD-10-CM | POA: Diagnosis not present

## 2017-05-12 DIAGNOSIS — G7000 Myasthenia gravis without (acute) exacerbation: Secondary | ICD-10-CM | POA: Diagnosis not present

## 2017-05-12 DIAGNOSIS — Z5181 Encounter for therapeutic drug level monitoring: Secondary | ICD-10-CM | POA: Diagnosis not present

## 2017-05-16 DIAGNOSIS — M545 Low back pain: Secondary | ICD-10-CM | POA: Diagnosis not present

## 2017-05-16 DIAGNOSIS — M9903 Segmental and somatic dysfunction of lumbar region: Secondary | ICD-10-CM | POA: Diagnosis not present

## 2017-05-16 DIAGNOSIS — M62830 Muscle spasm of back: Secondary | ICD-10-CM | POA: Diagnosis not present

## 2017-05-19 DIAGNOSIS — M545 Low back pain: Secondary | ICD-10-CM | POA: Diagnosis not present

## 2017-05-19 DIAGNOSIS — M62830 Muscle spasm of back: Secondary | ICD-10-CM | POA: Diagnosis not present

## 2017-05-19 DIAGNOSIS — M9903 Segmental and somatic dysfunction of lumbar region: Secondary | ICD-10-CM | POA: Diagnosis not present

## 2017-05-23 DIAGNOSIS — M545 Low back pain: Secondary | ICD-10-CM | POA: Diagnosis not present

## 2017-05-23 DIAGNOSIS — M62830 Muscle spasm of back: Secondary | ICD-10-CM | POA: Diagnosis not present

## 2017-05-23 DIAGNOSIS — M9903 Segmental and somatic dysfunction of lumbar region: Secondary | ICD-10-CM | POA: Diagnosis not present

## 2017-06-10 ENCOUNTER — Other Ambulatory Visit: Payer: Self-pay | Admitting: Internal Medicine

## 2017-06-19 ENCOUNTER — Encounter: Payer: Self-pay | Admitting: Internal Medicine

## 2017-06-20 ENCOUNTER — Other Ambulatory Visit: Payer: Self-pay | Admitting: Internal Medicine

## 2017-06-20 DIAGNOSIS — Z79899 Other long term (current) drug therapy: Secondary | ICD-10-CM

## 2017-06-29 ENCOUNTER — Other Ambulatory Visit (INDEPENDENT_AMBULATORY_CARE_PROVIDER_SITE_OTHER): Payer: 59

## 2017-06-29 DIAGNOSIS — Z79899 Other long term (current) drug therapy: Secondary | ICD-10-CM

## 2017-06-29 LAB — CBC WITH DIFFERENTIAL/PLATELET
Basophils Absolute: 0 10*3/uL (ref 0.0–0.1)
Basophils Relative: 0.5 % (ref 0.0–3.0)
Eosinophils Absolute: 0 10*3/uL (ref 0.0–0.7)
Eosinophils Relative: 0.3 % (ref 0.0–5.0)
HCT: 44.4 % (ref 39.0–52.0)
Hemoglobin: 14.8 g/dL (ref 13.0–17.0)
Lymphocytes Relative: 8.8 % — ABNORMAL LOW (ref 12.0–46.0)
Lymphs Abs: 0.8 10*3/uL (ref 0.7–4.0)
MCHC: 33.4 g/dL (ref 30.0–36.0)
MCV: 88.7 fl (ref 78.0–100.0)
Monocytes Absolute: 0.3 10*3/uL (ref 0.1–1.0)
Monocytes Relative: 3.8 % (ref 3.0–12.0)
Neutro Abs: 7.4 10*3/uL (ref 1.4–7.7)
Neutrophils Relative %: 86.6 % — ABNORMAL HIGH (ref 43.0–77.0)
Platelets: 325 10*3/uL (ref 150.0–400.0)
RBC: 5 Mil/uL (ref 4.22–5.81)
RDW: 14.5 % (ref 11.5–15.5)
WBC: 8.6 10*3/uL (ref 4.0–10.5)

## 2017-06-29 LAB — COMPREHENSIVE METABOLIC PANEL
ALT: 16 U/L (ref 0–53)
AST: 18 U/L (ref 0–37)
Albumin: 4.2 g/dL (ref 3.5–5.2)
Alkaline Phosphatase: 34 U/L — ABNORMAL LOW (ref 39–117)
BUN: 11 mg/dL (ref 6–23)
CO2: 31 mEq/L (ref 19–32)
Calcium: 9.7 mg/dL (ref 8.4–10.5)
Chloride: 102 mEq/L (ref 96–112)
Creatinine, Ser: 0.85 mg/dL (ref 0.40–1.50)
GFR: 101.84 mL/min (ref >60.00)
Glucose, Bld: 106 mg/dL — ABNORMAL HIGH (ref 70–99)
Potassium: 4.3 mEq/L (ref 3.5–5.1)
Sodium: 138 mEq/L (ref 135–145)
Total Bilirubin: 0.4 mg/dL (ref 0.2–1.2)
Total Protein: 6.5 g/dL (ref 6.0–8.3)

## 2017-06-29 NOTE — Telephone Encounter (Signed)
Error

## 2017-07-07 ENCOUNTER — Encounter: Payer: Self-pay | Admitting: Internal Medicine

## 2017-07-09 ENCOUNTER — Other Ambulatory Visit: Payer: Self-pay | Admitting: Internal Medicine

## 2017-07-09 DIAGNOSIS — Z79899 Other long term (current) drug therapy: Secondary | ICD-10-CM

## 2017-07-27 DIAGNOSIS — G7000 Myasthenia gravis without (acute) exacerbation: Secondary | ICD-10-CM | POA: Diagnosis not present

## 2017-07-27 DIAGNOSIS — G7001 Myasthenia gravis with (acute) exacerbation: Secondary | ICD-10-CM | POA: Diagnosis not present

## 2017-07-27 DIAGNOSIS — Z79899 Other long term (current) drug therapy: Secondary | ICD-10-CM | POA: Diagnosis not present

## 2017-08-02 ENCOUNTER — Other Ambulatory Visit: Payer: 59

## 2017-08-15 DIAGNOSIS — G7000 Myasthenia gravis without (acute) exacerbation: Secondary | ICD-10-CM | POA: Diagnosis not present

## 2017-08-17 DIAGNOSIS — G7000 Myasthenia gravis without (acute) exacerbation: Secondary | ICD-10-CM | POA: Diagnosis not present

## 2017-08-19 DIAGNOSIS — G7000 Myasthenia gravis without (acute) exacerbation: Secondary | ICD-10-CM | POA: Diagnosis not present

## 2017-08-19 DIAGNOSIS — Z7689 Persons encountering health services in other specified circumstances: Secondary | ICD-10-CM | POA: Diagnosis not present

## 2017-08-22 DIAGNOSIS — G7000 Myasthenia gravis without (acute) exacerbation: Secondary | ICD-10-CM | POA: Diagnosis not present

## 2017-08-24 DIAGNOSIS — G7000 Myasthenia gravis without (acute) exacerbation: Secondary | ICD-10-CM | POA: Diagnosis not present

## 2017-09-07 ENCOUNTER — Encounter: Payer: Self-pay | Admitting: Internal Medicine

## 2017-09-07 ENCOUNTER — Other Ambulatory Visit (INDEPENDENT_AMBULATORY_CARE_PROVIDER_SITE_OTHER): Payer: 59

## 2017-09-07 DIAGNOSIS — Z79899 Other long term (current) drug therapy: Secondary | ICD-10-CM

## 2017-09-07 LAB — COMPREHENSIVE METABOLIC PANEL
ALT: 14 U/L (ref 0–53)
AST: 18 U/L (ref 0–37)
Albumin: 4.2 g/dL (ref 3.5–5.2)
Alkaline Phosphatase: 29 U/L — ABNORMAL LOW (ref 39–117)
BUN: 15 mg/dL (ref 6–23)
CO2: 32 mEq/L (ref 19–32)
Calcium: 8.7 mg/dL (ref 8.4–10.5)
Chloride: 105 mEq/L (ref 96–112)
Creatinine, Ser: 0.88 mg/dL (ref 0.40–1.50)
GFR: 97.77 mL/min (ref >60.00)
Glucose, Bld: 79 mg/dL (ref 70–99)
Potassium: 4 mEq/L (ref 3.5–5.1)
Sodium: 142 mEq/L (ref 135–145)
Total Bilirubin: 0.6 mg/dL (ref 0.2–1.2)
Total Protein: 5.5 g/dL — ABNORMAL LOW (ref 6.0–8.3)

## 2017-09-07 LAB — CBC WITH DIFFERENTIAL/PLATELET
Basophils Absolute: 0.1 10*3/uL (ref 0.0–0.1)
Basophils Relative: 1 % (ref 0.0–3.0)
Eosinophils Absolute: 0.1 10*3/uL (ref 0.0–0.7)
Eosinophils Relative: 1.5 % (ref 0.0–5.0)
HCT: 41.4 % (ref 39.0–52.0)
Hemoglobin: 14 g/dL (ref 13.0–17.0)
Lymphocytes Relative: 28.4 % (ref 12.0–46.0)
Lymphs Abs: 2.3 10*3/uL (ref 0.7–4.0)
MCHC: 33.8 g/dL (ref 30.0–36.0)
MCV: 90.3 fl (ref 78.0–100.0)
Monocytes Absolute: 0.8 10*3/uL (ref 0.1–1.0)
Monocytes Relative: 9.4 % (ref 3.0–12.0)
Neutro Abs: 4.8 10*3/uL (ref 1.4–7.7)
Neutrophils Relative %: 59.7 % (ref 43.0–77.0)
Platelets: 296 10*3/uL (ref 150.0–400.0)
RBC: 4.59 Mil/uL (ref 4.22–5.81)
RDW: 15.4 % (ref 11.5–15.5)
WBC: 8 10*3/uL (ref 4.0–10.5)

## 2017-09-09 ENCOUNTER — Other Ambulatory Visit: Payer: Self-pay | Admitting: Internal Medicine

## 2017-09-09 NOTE — Telephone Encounter (Signed)
Refilled: 06/10/2017 Last OV: 01/31/2017 saw Dr. Caryl Bis Next OV: not scheduled

## 2017-09-09 NOTE — Telephone Encounter (Signed)
ok'd rx for mirapex.  Please confirm with pt taking 2(.5mg ) tablets q hs.  rx ok'd.

## 2017-10-10 ENCOUNTER — Other Ambulatory Visit: Payer: Self-pay | Admitting: Internal Medicine

## 2017-10-21 ENCOUNTER — Other Ambulatory Visit: Payer: 59

## 2017-10-24 ENCOUNTER — Telehealth: Payer: Self-pay | Admitting: Radiology

## 2017-10-24 DIAGNOSIS — Z79899 Other long term (current) drug therapy: Secondary | ICD-10-CM

## 2017-10-24 DIAGNOSIS — E785 Hyperlipidemia, unspecified: Secondary | ICD-10-CM

## 2017-10-24 NOTE — Telephone Encounter (Signed)
Cbc cmet and fasting lipids (if he is fasting,  Has not  been done in > 2 yrs)

## 2017-10-24 NOTE — Addendum Note (Signed)
Addended by: [name] on: 10/24/2017 03:03 PM   Modules accepted: Orders

## 2017-10-24 NOTE — Telephone Encounter (Signed)
Pt coming in for labs tomorrow please place future orders. Thank you.  

## 2017-10-25 ENCOUNTER — Encounter: Payer: Self-pay | Admitting: Internal Medicine

## 2017-10-25 ENCOUNTER — Other Ambulatory Visit (INDEPENDENT_AMBULATORY_CARE_PROVIDER_SITE_OTHER): Payer: 59

## 2017-10-25 DIAGNOSIS — E785 Hyperlipidemia, unspecified: Secondary | ICD-10-CM | POA: Diagnosis not present

## 2017-10-25 DIAGNOSIS — Z79899 Other long term (current) drug therapy: Secondary | ICD-10-CM

## 2017-10-25 LAB — COMPREHENSIVE METABOLIC PANEL
ALT: 14 U/L (ref 0–53)
AST: 18 U/L (ref 0–37)
Albumin: 3.9 g/dL (ref 3.5–5.2)
Alkaline Phosphatase: 34 U/L — ABNORMAL LOW (ref 39–117)
BUN: 14 mg/dL (ref 6–23)
CO2: 35 mEq/L — ABNORMAL HIGH (ref 19–32)
Calcium: 9.3 mg/dL (ref 8.4–10.5)
Chloride: 102 mEq/L (ref 96–112)
Creatinine, Ser: 0.92 mg/dL (ref 0.40–1.50)
GFR: 92.83 mL/min (ref >60.00)
Glucose, Bld: 80 mg/dL (ref 70–99)
Potassium: 3.7 mEq/L (ref 3.5–5.1)
Sodium: 142 mEq/L (ref 135–145)
Total Bilirubin: 0.6 mg/dL (ref 0.2–1.2)
Total Protein: 5.9 g/dL — ABNORMAL LOW (ref 6.0–8.3)

## 2017-10-25 LAB — LIPID PANEL
Cholesterol: 185 mg/dL (ref 0–200)
HDL: 70.1 mg/dL (ref >39.00)
LDL Cholesterol: 97 mg/dL (ref 0–99)
NonHDL: 114.51
Total CHOL/HDL Ratio: 3
Triglycerides: 86 mg/dL (ref 0.0–149.0)
VLDL: 17.2 mg/dL (ref 0.0–40.0)

## 2017-10-25 LAB — CBC WITH DIFFERENTIAL/PLATELET
Basophils Absolute: 0.1 10*3/uL (ref 0.0–0.1)
Basophils Relative: 0.9 % (ref 0.0–3.0)
Eosinophils Absolute: 0.1 10*3/uL (ref 0.0–0.7)
Eosinophils Relative: 1.5 % (ref 0.0–5.0)
HCT: 44.9 % (ref 39.0–52.0)
Hemoglobin: 15.4 g/dL (ref 13.0–17.0)
Lymphocytes Relative: 29.3 % (ref 12.0–46.0)
Lymphs Abs: 2.4 10*3/uL (ref 0.7–4.0)
MCHC: 34.2 g/dL (ref 30.0–36.0)
MCV: 90.8 fl (ref 78.0–100.0)
Monocytes Absolute: 0.6 10*3/uL (ref 0.1–1.0)
Monocytes Relative: 7.4 % (ref 3.0–12.0)
Neutro Abs: 5.1 10*3/uL (ref 1.4–7.7)
Neutrophils Relative %: 60.9 % (ref 43.0–77.0)
Platelets: 323 10*3/uL (ref 150.0–400.0)
RBC: 4.95 Mil/uL (ref 4.22–5.81)
RDW: 15 % (ref 11.5–15.5)
WBC: 8.3 10*3/uL (ref 4.0–10.5)

## 2017-11-10 ENCOUNTER — Other Ambulatory Visit: Payer: Self-pay | Admitting: Internal Medicine

## 2017-11-14 NOTE — Telephone Encounter (Signed)
Last OV 04/2016 Next OV none  Last refill 10/10/2017

## 2017-11-23 DIAGNOSIS — G7000 Myasthenia gravis without (acute) exacerbation: Secondary | ICD-10-CM | POA: Diagnosis not present

## 2017-11-23 DIAGNOSIS — Z79899 Other long term (current) drug therapy: Secondary | ICD-10-CM | POA: Diagnosis not present

## 2017-11-25 ENCOUNTER — Other Ambulatory Visit: Payer: 59

## 2017-11-25 DIAGNOSIS — Z9889 Other specified postprocedural states: Secondary | ICD-10-CM | POA: Insufficient documentation

## 2017-12-13 ENCOUNTER — Other Ambulatory Visit: Payer: Self-pay | Admitting: Internal Medicine

## 2017-12-18 ENCOUNTER — Encounter: Payer: Self-pay | Admitting: Internal Medicine

## 2018-01-09 ENCOUNTER — Encounter: Payer: Self-pay | Admitting: Internal Medicine

## 2018-01-14 ENCOUNTER — Other Ambulatory Visit: Payer: Self-pay | Admitting: Internal Medicine

## 2018-01-16 NOTE — Telephone Encounter (Signed)
Refilled: 08/31/2015 Last OV: 04/07/2016 Next OV: not scheduled

## 2018-01-31 DIAGNOSIS — M545 Low back pain: Secondary | ICD-10-CM | POA: Diagnosis not present

## 2018-01-31 DIAGNOSIS — M25551 Pain in right hip: Secondary | ICD-10-CM | POA: Diagnosis not present

## 2018-01-31 DIAGNOSIS — M25561 Pain in right knee: Secondary | ICD-10-CM | POA: Diagnosis not present

## 2018-03-01 ENCOUNTER — Encounter: Payer: Self-pay | Admitting: Internal Medicine

## 2018-03-01 ENCOUNTER — Telehealth: Payer: Self-pay | Admitting: Radiology

## 2018-03-01 DIAGNOSIS — Z79899 Other long term (current) drug therapy: Secondary | ICD-10-CM

## 2018-03-01 NOTE — Telephone Encounter (Signed)
Pt coming in for labs tomorrow, please place future orders. Thank you.  

## 2018-03-01 NOTE — Addendum Note (Signed)
Addended by: [name] on: 03/01/2018 01:01 PM   Modules accepted: Orders

## 2018-03-02 ENCOUNTER — Other Ambulatory Visit (INDEPENDENT_AMBULATORY_CARE_PROVIDER_SITE_OTHER): Payer: 59

## 2018-03-02 DIAGNOSIS — Z79899 Other long term (current) drug therapy: Secondary | ICD-10-CM

## 2018-03-02 LAB — CBC WITH DIFFERENTIAL/PLATELET
Basophils Absolute: 0.1 10*3/uL (ref 0.0–0.1)
Basophils Relative: 0.7 % (ref 0.0–3.0)
Eosinophils Absolute: 0.1 10*3/uL (ref 0.0–0.7)
Eosinophils Relative: 0.8 % (ref 0.0–5.0)
HCT: 42.3 % (ref 39.0–52.0)
Hemoglobin: 14.2 g/dL (ref 13.0–17.0)
Lymphocytes Relative: 17.6 % (ref 12.0–46.0)
Lymphs Abs: 1.8 10*3/uL (ref 0.7–4.0)
MCHC: 33.5 g/dL (ref 30.0–36.0)
MCV: 91.4 fl (ref 78.0–100.0)
Monocytes Absolute: 0.9 10*3/uL (ref 0.1–1.0)
Monocytes Relative: 8.7 % (ref 3.0–12.0)
Neutro Abs: 7.5 10*3/uL (ref 1.4–7.7)
Neutrophils Relative %: 72.2 % (ref 43.0–77.0)
Platelets: 257 10*3/uL (ref 150.0–400.0)
RBC: 4.62 Mil/uL (ref 4.22–5.81)
RDW: 15.5 % (ref 11.5–15.5)
WBC: 10.4 10*3/uL (ref 4.0–10.5)

## 2018-03-02 LAB — COMPREHENSIVE METABOLIC PANEL
ALT: 12 U/L (ref 0–53)
AST: 14 U/L (ref 0–37)
Albumin: 3.7 g/dL (ref 3.5–5.2)
Alkaline Phosphatase: 35 U/L — ABNORMAL LOW (ref 39–117)
BUN: 14 mg/dL (ref 6–23)
CO2: 32 mEq/L (ref 19–32)
Calcium: 9 mg/dL (ref 8.4–10.5)
Chloride: 105 mEq/L (ref 96–112)
Creatinine, Ser: 0.92 mg/dL (ref 0.40–1.50)
GFR: 92.69 mL/min (ref >60.00)
Glucose, Bld: 79 mg/dL (ref 70–99)
Potassium: 4 mEq/L (ref 3.5–5.1)
Sodium: 143 mEq/L (ref 135–145)
Total Bilirubin: 0.5 mg/dL (ref 0.2–1.2)
Total Protein: 6.4 g/dL (ref 6.0–8.3)

## 2018-03-21 DIAGNOSIS — M1611 Unilateral primary osteoarthritis, right hip: Secondary | ICD-10-CM | POA: Diagnosis not present

## 2018-03-28 ENCOUNTER — Other Ambulatory Visit: Payer: Self-pay | Admitting: Internal Medicine

## 2018-03-29 NOTE — Telephone Encounter (Signed)
Refilled: 04/24/2013 Last OV: 04/07/2016 Next OV: not scheduled Last Vitamin D: 03/04/2017 level was 38

## 2018-04-26 DIAGNOSIS — M25551 Pain in right hip: Secondary | ICD-10-CM | POA: Diagnosis not present

## 2018-05-10 DIAGNOSIS — Z79899 Other long term (current) drug therapy: Secondary | ICD-10-CM | POA: Diagnosis not present

## 2018-05-10 DIAGNOSIS — G7000 Myasthenia gravis without (acute) exacerbation: Secondary | ICD-10-CM | POA: Diagnosis not present

## 2018-05-28 ENCOUNTER — Other Ambulatory Visit: Payer: Self-pay | Admitting: Internal Medicine

## 2018-06-02 ENCOUNTER — Other Ambulatory Visit: Payer: 59

## 2018-08-13 DIAGNOSIS — Z23 Encounter for immunization: Secondary | ICD-10-CM | POA: Diagnosis not present

## 2018-08-18 ENCOUNTER — Other Ambulatory Visit: Payer: Self-pay

## 2018-08-18 MED ORDER — PRAMIPEXOLE DIHYDROCHLORIDE 0.5 MG PO TABS: 1.0000 mg | ORAL_TABLET | Freq: Every day | ORAL | 0 refills | Status: DC

## 2018-09-12 ENCOUNTER — Other Ambulatory Visit: Payer: Self-pay | Admitting: Internal Medicine

## 2018-09-13 ENCOUNTER — Other Ambulatory Visit: Payer: Self-pay | Admitting: Internal Medicine

## 2018-09-13 DIAGNOSIS — G7000 Myasthenia gravis without (acute) exacerbation: Secondary | ICD-10-CM

## 2018-09-13 DIAGNOSIS — D150 Benign neoplasm of thymus: Principal | ICD-10-CM

## 2018-09-13 DIAGNOSIS — D4989 Neoplasm of unspecified behavior of other specified sites: Secondary | ICD-10-CM

## 2018-09-13 NOTE — Telephone Encounter (Signed)
Refilled: 03/30/2018 Last OV: 01/31/2017 with Dr. Caryl Bis Next OV: not scheduled Last Vitamin D lab: 03/04/2017

## 2018-09-13 NOTE — Telephone Encounter (Signed)
refilling for 3 months  patient needs to have non fasting labs done this month

## 2018-10-01 ENCOUNTER — Other Ambulatory Visit: Payer: Self-pay | Admitting: Internal Medicine

## 2018-10-02 DIAGNOSIS — S0501XA Injury of conjunctiva and corneal abrasion without foreign body, right eye, initial encounter: Secondary | ICD-10-CM | POA: Diagnosis not present

## 2018-10-02 NOTE — Telephone Encounter (Signed)
Refilled: 09/13/2018 Last OV: 01/31/2017 Next OV: not scheduled

## 2018-10-16 ENCOUNTER — Ambulatory Visit: Payer: 59 | Admitting: Internal Medicine

## 2018-10-16 ENCOUNTER — Encounter: Payer: Self-pay | Admitting: Internal Medicine

## 2018-10-16 DIAGNOSIS — G8929 Other chronic pain: Secondary | ICD-10-CM

## 2018-10-16 DIAGNOSIS — D150 Benign neoplasm of thymus: Secondary | ICD-10-CM | POA: Diagnosis not present

## 2018-10-16 DIAGNOSIS — R1031 Right lower quadrant pain: Secondary | ICD-10-CM

## 2018-10-16 DIAGNOSIS — G7000 Myasthenia gravis without (acute) exacerbation: Secondary | ICD-10-CM | POA: Diagnosis not present

## 2018-10-16 NOTE — Patient Instructions (Addendum)
OK TO USE MELOXICAM DAILY BUT NEED TO PROTECT STOMACH WITH A PPI (NEXIUM )   You can add up to 2000 mg of acetominophen (tylenol) every day safely  In divided doses (500 mg every 6 hours  Or 1000 mg every 12 hours.)   Ct abd to be done at [place] and referral to due general surgeon is recommended   Consider PT before considering surgery  STRETCH!

## 2018-10-16 NOTE — Progress Notes (Signed)
Subjective:  Patient ID: Frank Cabrera, male    DOB: [birth year]  Age: 53 y.o. MRN: [id number]  CC: Diagnoses of Myasthenia gravis associated with thymoma ([place]), Right lower quadrant abdominal pain, and Abdominal pain, chronic, right lower quadrant were pertinent to this visit.  HPI Frank Cabrera presents for evaluation and management of RLQ abd pain that has been intermittent since Dec 2017.  The pain occurs with physical activity and /or raising his right leg while bent from the waist.  It is acocompanied by a bulge which is reducible. He was previously evaluated in 2017 for same and the evaluaing physician suspected that he has a Spigelian hernia based on the location of the pain .  Imaging has not been done.  In more recent months he has had episdoes of painful bulge on the contralateral sie as well .  He has had no primary care follow up since 2017.  He has seropositive generalized myasthenia gravis diagnosed in 1998,  S/p thymectomy 1995 (negative for thymoma) which is managed by [facility] and receives regular surveillance labs which are drawn in our office and have been normal.  He is up to date on influenza  And Hepatitis A vaccines but has not had Pneumonia vaccination.   He is also having RIGHT KNEE pain that has been evaluated along with  RIGHT HIP PAIN by Marisa Sprinkles,   S/p I/A steroid injection in hip which has relieved his pain for many months.  Hip pain is aggravated by playing tennis and climbing stairs. He uses tylenol prn . He has been offered hip replacement as an option for definitive management of "arthritis." (records not available)    Outpatient Medications Prior to Visit  Medication Sig Dispense Refill  . alendronate (FOSAMAX) 35 MG tablet TAKE 1 TABLET BY MOUTH EVERY 7 DAYS 12 tablet 1  . CALCIUM PO Take 500 mg by mouth daily before breakfast.     . Flaxseed, Linseed, (FLAXSEED OIL) 1000 MG CAPS Take by mouth daily.    . folic acid (FOLVITE) 1  MG tablet 1 tablet by mouth each day EXCEPT for the day you takes methotrexate    . Misc Natural Products (OSTEO BI-FLEX JOINT SHIELD PO) Take 1 tablet by mouth daily.     . Multiple Vitamin (MULTIVITAMIN) tablet Take 1 tablet by mouth daily.    . pramipexole (MIRAPEX) 0.5 MG tablet TAKE 2 TABLETS (1 MG TOTAL) BY MOUTH AT BEDTIME. 60 tablet 0  . PREDNISONE PO Take 25 mg by mouth daily.    . Vitamin D, Ergocalciferol, (DRISDOL) 1.25 MG (50000 UT) CAPS capsule TAKE 1 CAPSULE (50,000 UNITS TOTAL) BY MOUTH EVERY 14 (FOURTEEN) DAYS. 6 capsule 0  . azathioprine (IMURAN) 100 MG tablet     . methotrexate (RHEUMATREX) 15 MG tablet Take 20 mg by mouth once a week. Caution: Chemotherapy. Protect from light.     No facility-administered medications prior to visit.     Review of Systems;  Patient denies headache, fevers, malaise, unintentional weight loss, skin rash, eye pain, sinus congestion and sinus pain, sore throat, dysphagia,  hemoptysis , cough, dyspnea, wheezing, chest pain, palpitations, orthopnea, edema,  nausea, melena, diarrhea, constipation, flank pain, dysuria, hematuria, urinary  Frequency, nocturia, numbness, tingling, seizures,  Focal weakness, Loss of consciousness,  Tremor, insomnia, depression, anxiety, and suicidal ideation.      Objective:  BP (!) 90/58 (BP Location: Left Arm, Patient Position: Sitting, Cuff Size: Normal)  Pulse (!) 51   Temp 97.7 F (36.5 C) (Oral)   Resp 14   Ht 5\' 6"  (1.676 m)   Wt 169 lb 9.6 oz (76.9 kg)   SpO2 97%   BMI 27.37 kg/m   BP Readings from Last 3 Encounters:  10/16/18 (!) 90/58  01/31/17 100/68  09/29/16 96/64    Wt Readings from Last 3 Encounters:  10/16/18 169 lb 9.6 oz (76.9 kg)  01/31/17 169 lb (76.7 kg)  09/29/16 163 lb 4 oz (74 kg)    General appearance: alert, cooperative and appears stated age Ears: normal TM's and external ear canals both ears Throat: lips, mucosa, and tongue normal; teeth and gums normal Neck: no  adenopathy, no carotid bruit, supple, symmetrical, trachea midline and thyroid not enlarged, symmetric, no tenderness/mass/nodules Back: symmetric, no curvature. ROM normal. No CVA tenderness. Lungs: clear to auscultation bilaterally Heart: regular rate and rhythm, S1, S2 normal, no murmur, click, rub or gallop Abdomen: soft, non-tender; bowel sounds normal; no masses,  no organomegaly. Not able to produce bulge with Valsava maneuver Pulses: 2+ and symmetric Skin: Skin color, texture, turgor normal. No rashes or lesions Lymph nodes: Cervical, supraclavicular, and axillary nodes normal.  No results found for: HGBA1C  Lab Results  Component Value Date   CREATININE 0.92 03/02/2018   CREATININE 0.92 10/25/2017   CREATININE 0.88 09/07/2017    Lab Results  Component Value Date   WBC 10.4 03/02/2018   HGB 14.2 03/02/2018   HCT 42.3 03/02/2018   PLT 257.0 03/02/2018   GLUCOSE 79 03/02/2018   CHOL 185 10/25/2017   TRIG 86.0 10/25/2017   HDL 70.10 10/25/2017   LDLCALC 97 10/25/2017   ALT 12 03/02/2018   AST 14 03/02/2018   NA 143 03/02/2018   K 4.0 03/02/2018   CL 105 03/02/2018   CREATININE 0.92 03/02/2018   BUN 14 03/02/2018   CO2 32 03/02/2018    Dg Cervical Spine Complete  Result Date: 04/18/2015 CLINICAL DATA:  Two months of pain status post fall at work 18 months ago, no radicular symptoms, possible scoliosis. EXAM: CERVICAL SPINE  4+ VIEWS COMPARISON:  None. FINDINGS: The cervical vertebral bodies are preserved in height. The disc space heights are well maintained. The prevertebral soft tissue spaces are normal. There is no perched facet or spinous process fracture. There is mild facet joint hypertrophy at C7-T1. Evaluation of the neural foramina on the right is limited due to patient positioning. No significant neural foraminal encroachment on the left is observed. IMPRESSION: There is no compression fracture nor high-grade disc space narrowing. There is mild facet joint  hypertrophy at the cervicothoracic junction. Electronically Signed   By: David  Martinique M.D.   On: 04/18/2015 13:59   Dg Thoracic Spine W/swimmers  Result Date: 04/18/2015 CLINICAL DATA:  Right-sided thoracic back pain for 3 months. EXAM: THORACIC SPINE - 2 VIEW + SWIMMERS COMPARISON:  Chest x-ray dated 05/31/2012 FINDINGS: There is no fracture or bone destruction or disc space narrowing. There is no scoliosis. IMPRESSION: Negative exam. Electronically Signed   By: Lorriane Shire M.D.   On: 04/18/2015 13:59    Assessment & Plan:   Problem List Items Addressed This Visit    Abdominal pain, chronic, right lower quadrant    He has recurrent self limiting episodes of painful bulge located I the Spigelian line.  He has been able to reduce on occurs.  He has no history of abdominal surgery.  CT with and without contrast ordered.  Given his active diagnosis of myasthenia gravis,  he would  need to see a surgeon at [facility] given the high risk of MG complications from general anesthesia.  Therefore I have ordered the CT to be done at [facility].       Myasthenia gravis associated with thymoma ([place])    Other Visit Diagnoses    Right lower quadrant abdominal pain       Relevant Orders   CT ABDOMEN PELVIS W WO CONTRAST      I have discontinued Marta Lamas. Benda's methotrexate. I am also having him maintain his Flaxseed Oil, multivitamin, Misc Natural Products (OSTEO BI-FLEX JOINT SHIELD PO), CALCIUM PO, folic acid, PREDNISONE PO, alendronate, pramipexole, Vitamin D (Ergocalciferol), and azathioprine.  No orders of the defined types were placed in this encounter.   Medications Discontinued During This Encounter  Medication Reason  . methotrexate (RHEUMATREX) 15 MG tablet Patient has not taken in last 30 days    Follow-up: No follow-ups on file.   [name], MD

## 2018-10-17 LAB — COMPREHENSIVE METABOLIC PANEL
ALT: 13 U/L (ref 0–53)
AST: 21 U/L (ref 0–37)
Albumin: 3.9 g/dL (ref 3.5–5.2)
Alkaline Phosphatase: 34 U/L — ABNORMAL LOW (ref 39–117)
BUN: 14 mg/dL (ref 6–23)
CO2: 28 mEq/L (ref 19–32)
Calcium: 9.2 mg/dL (ref 8.4–10.5)
Chloride: 105 mEq/L (ref 96–112)
Creatinine, Ser: 1.16 mg/dL (ref 0.40–1.50)
GFR: 70.76 mL/min (ref >60.00)
Glucose, Bld: 87 mg/dL (ref 70–99)
Potassium: 4.4 mEq/L (ref 3.5–5.1)
Sodium: 140 mEq/L (ref 135–145)
Total Bilirubin: 0.3 mg/dL (ref 0.2–1.2)
Total Protein: 6.4 g/dL (ref 6.0–8.3)

## 2018-10-17 LAB — CBC WITH DIFFERENTIAL/PLATELET
Basophils Absolute: 0.1 10*3/uL (ref 0.0–0.1)
Basophils Relative: 1.1 % (ref 0.0–3.0)
Eosinophils Absolute: 0 10*3/uL (ref 0.0–0.7)
Eosinophils Relative: 0.1 % (ref 0.0–5.0)
HCT: 41.3 % (ref 39.0–52.0)
Hemoglobin: 14.1 g/dL (ref 13.0–17.0)
Lymphocytes Relative: 12.9 % (ref 12.0–46.0)
Lymphs Abs: 1.1 10*3/uL (ref 0.7–4.0)
MCHC: 34.2 g/dL (ref 30.0–36.0)
MCV: 91.3 fl (ref 78.0–100.0)
Monocytes Absolute: 0.7 10*3/uL (ref 0.1–1.0)
Monocytes Relative: 8.3 % (ref 3.0–12.0)
Neutro Abs: 6.5 10*3/uL (ref 1.4–7.7)
Neutrophils Relative %: 77.6 % — ABNORMAL HIGH (ref 43.0–77.0)
Platelets: 311 10*3/uL (ref 150.0–400.0)
RBC: 4.52 Mil/uL (ref 4.22–5.81)
RDW: 14.6 % (ref 11.5–15.5)
WBC: 8.4 10*3/uL (ref 4.0–10.5)

## 2018-10-17 LAB — VITAMIN D 25 HYDROXY (VIT D DEFICIENCY, FRACTURES): VITD: 48.28 ng/mL (ref 30.00–100.00)

## 2018-10-17 NOTE — Assessment & Plan Note (Signed)
He has recurrent self limiting episodes of painful bulge located I the Spigelian line.  He has been able to reduce on occurs.  He has no history of abdominal surgery.  CT with and without contrast ordered.  Given his active diagnosis of myasthenia gravis,  he would  need to see a surgeon at [facility] given the high risk of MG complications from general anesthesia.  Therefore I have ordered the CT to be done at [facility]

## 2018-10-18 DIAGNOSIS — R1031 Right lower quadrant pain: Principal | ICD-10-CM

## 2018-10-18 DIAGNOSIS — G8929 Other chronic pain: Secondary | ICD-10-CM

## 2018-10-25 ENCOUNTER — Other Ambulatory Visit: Payer: Self-pay | Admitting: Internal Medicine

## 2018-10-25 DIAGNOSIS — R1031 Right lower quadrant pain: Secondary | ICD-10-CM

## 2018-10-25 NOTE — Progress Notes (Signed)
Ct abd

## 2018-11-06 ENCOUNTER — Other Ambulatory Visit: Payer: Self-pay | Admitting: Internal Medicine

## 2018-11-09 DIAGNOSIS — K561 Intussusception: Secondary | ICD-10-CM | POA: Diagnosis not present

## 2018-11-09 DIAGNOSIS — R1031 Right lower quadrant pain: Secondary | ICD-10-CM | POA: Diagnosis not present

## 2018-11-16 DIAGNOSIS — K909 Intestinal malabsorption, unspecified: Secondary | ICD-10-CM | POA: Diagnosis not present

## 2018-11-16 DIAGNOSIS — G7000 Myasthenia gravis without (acute) exacerbation: Secondary | ICD-10-CM | POA: Diagnosis not present

## 2018-11-16 DIAGNOSIS — R1031 Right lower quadrant pain: Secondary | ICD-10-CM | POA: Diagnosis not present

## 2018-11-16 DIAGNOSIS — K219 Gastro-esophageal reflux disease without esophagitis: Secondary | ICD-10-CM | POA: Diagnosis not present

## 2018-11-26 ENCOUNTER — Other Ambulatory Visit: Payer: Self-pay | Admitting: Internal Medicine

## 2018-12-06 DIAGNOSIS — Z79899 Other long term (current) drug therapy: Secondary | ICD-10-CM | POA: Diagnosis not present

## 2018-12-06 DIAGNOSIS — G7000 Myasthenia gravis without (acute) exacerbation: Secondary | ICD-10-CM | POA: Diagnosis not present

## 2018-12-19 ENCOUNTER — Other Ambulatory Visit: Payer: Self-pay | Admitting: Internal Medicine

## 2019-01-11 ENCOUNTER — Other Ambulatory Visit: Payer: Self-pay | Admitting: Internal Medicine

## 2019-01-11 NOTE — Telephone Encounter (Signed)
Refilled: 12/19/2018 Last OV: 10/16/2018 Next OV: not scheduled

## 2019-01-15 ENCOUNTER — Other Ambulatory Visit: Payer: Self-pay

## 2019-01-15 MED ORDER — VITAMIN D (ERGOCALCIFEROL) 1.25 MG (50000 UNIT) PO CAPS: 50000.0000 [IU] | ORAL_CAPSULE | ORAL | 1 refills | Status: DC

## 2019-01-15 NOTE — Telephone Encounter (Signed)
Refilled: 10/02/2018 Last OV: 10/16/2018 Next OV: not scheduled

## 2019-03-09 ENCOUNTER — Other Ambulatory Visit: Payer: Self-pay | Admitting: Internal Medicine

## 2019-03-09 ENCOUNTER — Other Ambulatory Visit: Payer: 59

## 2019-03-09 ENCOUNTER — Other Ambulatory Visit: Payer: Self-pay

## 2019-03-09 ENCOUNTER — Telehealth: Payer: Self-pay

## 2019-03-09 DIAGNOSIS — Z8616 Personal history of COVID-19: Secondary | ICD-10-CM

## 2019-03-09 DIAGNOSIS — Z20828 Contact with and (suspected) exposure to other viral communicable diseases: Secondary | ICD-10-CM

## 2019-03-09 DIAGNOSIS — Z20822 Contact with and (suspected) exposure to covid-19: Secondary | ICD-10-CM

## 2019-03-09 HISTORY — DX: Personal history of COVID-19: Z86.16

## 2019-03-09 NOTE — Telephone Encounter (Signed)
Pt was returning call to be scheduled for COVID-19 testing. Pt placed on schedule. Order has already been entered by Dr Derrel Nip office

## 2019-03-09 NOTE — Telephone Encounter (Signed)
Left message to call back for appointment.  

## 2019-03-11 ENCOUNTER — Telehealth: Payer: Self-pay | Admitting: Internal Medicine

## 2019-03-11 LAB — NOVEL CORONAVIRUS, NAA: SARS-CoV-2, NAA: DETECTED — AB

## 2019-03-11 NOTE — Telephone Encounter (Signed)
Call from [place] RN---got call from [facility] lab about positive COVID test. Reached him on his cellphone He is a bit surprised because he had minimal interaction with the positive coworker Wonders how much chance there is of a false positive---I told him I felt it was probably pretty small. Recommended he contact Dr Derrel Nip about retesting (because he requested this) Difficult to make recommendations about quarantine due to no symptoms---but suggested he should for a week for sure (if not 10 days)

## 2019-03-19 ENCOUNTER — Ambulatory Visit: Payer: Self-pay | Admitting: Internal Medicine

## 2019-03-23 ENCOUNTER — Other Ambulatory Visit: Payer: Self-pay

## 2019-03-23 DIAGNOSIS — R079 Chest pain, unspecified: Secondary | ICD-10-CM | POA: Insufficient documentation

## 2019-03-23 DIAGNOSIS — R51 Headache: Secondary | ICD-10-CM | POA: Insufficient documentation

## 2019-03-23 DIAGNOSIS — Z79899 Other long term (current) drug therapy: Secondary | ICD-10-CM | POA: Diagnosis not present

## 2019-03-23 DIAGNOSIS — J45909 Unspecified asthma, uncomplicated: Secondary | ICD-10-CM | POA: Insufficient documentation

## 2019-03-23 LAB — CBC
HCT: 42.8 % (ref 39.0–52.0)
Hemoglobin: 14.2 g/dL (ref 13.0–17.0)
MCH: 30.1 pg (ref 26.0–34.0)
MCHC: 33.2 g/dL (ref 30.0–36.0)
MCV: 90.9 fL (ref 80.0–100.0)
Platelets: 311 10*3/uL (ref 150–400)
RBC: 4.71 MIL/uL (ref 4.22–5.81)
RDW: 13 % (ref 11.5–15.5)
WBC: 10.1 10*3/uL (ref 4.0–10.5)
nRBC: 0 % (ref 0.0–0.2)

## 2019-03-23 LAB — BASIC METABOLIC PANEL
Anion gap: 8 (ref 5–15)
BUN: 18 mg/dL (ref 6–20)
CO2: 27 mmol/L (ref 22–32)
Calcium: 9.2 mg/dL (ref 8.9–10.3)
Chloride: 105 mmol/L (ref 98–111)
Creatinine, Ser: 0.91 mg/dL (ref 0.61–1.24)
GFR calc Af Amer: >60 mL/min (ref >60)
GFR calc non Af Amer: >60 mL/min (ref >60)
Glucose, Bld: 109 mg/dL — ABNORMAL HIGH (ref 70–99)
Potassium: 4 mmol/L (ref 3.5–5.1)
Sodium: 140 mmol/L (ref 135–145)

## 2019-03-23 LAB — TROPONIN I: Troponin I: <0.03 ng/mL (ref <0.03)

## 2019-03-23 NOTE — ED Triage Notes (Signed)
Patient reports diagnosed with COVID 2 weeks ago.  Reports most of symptoms were mild but reports continued headache and chest pain (pain worsens with laying down).

## 2019-03-24 ENCOUNTER — Emergency Department
Admission: EM | Admit: 2019-03-24 | Discharge: 2019-03-24 | Disposition: A | Discharge status: Home / Self Care | Payer: 59 | Attending: Emergency Medicine | Admitting: Emergency Medicine

## 2019-03-24 ENCOUNTER — Emergency Department: Payer: 59

## 2019-03-24 DIAGNOSIS — R079 Chest pain, unspecified: Secondary | ICD-10-CM

## 2019-03-24 LAB — TROPONIN I: Troponin I: <0.03 ng/mL (ref <0.03)

## 2019-03-24 LAB — FIBRIN DERIVATIVES D-DIMER (ARMC ONLY): Fibrin derivatives D-dimer (ARMC): 183.01 ng/mL (FEU) (ref 0.00–499.00)

## 2019-03-24 MED ORDER — ALUM & MAG HYDROXIDE-SIMETH 200-200-20 MG/5ML PO SUSP
30.0000 mL | Freq: Once | ORAL | Status: AC
  Administered 2019-03-24: 30 mL via ORAL
  Filled 2019-03-24: qty 30

## 2019-03-24 MED ORDER — LIDOCAINE VISCOUS HCL 2 % MT SOLN
15.0000 mL | Freq: Once | OROMUCOSAL | Status: AC
  Administered 2019-03-24: 15 mL via ORAL
  Filled 2019-03-24: qty 15

## 2019-03-24 NOTE — ED Provider Notes (Signed)
[facility] Emergency Department Provider Note  ____________________________________________  Time seen: Approximately 4:23 AM  I have reviewed the triage vital signs and the nursing notes.   HISTORY  Chief Complaint Headache and Chest Pain   HPI Frank Cabrera is a 53 y.o. male who presents for evaluation of chest pain.  Patient was diagnosed with COVID 2 weeks ago.  Since then he has had daily headaches and chest pain.  The headaches are improving however the chest pain has become worse over the last 5 nights.  The chest pain is worse at nighttime and patient reports that it wakes him up.  He describes this as a dull pain located in the center of his chest, similar to reflux which he used to have several years ago.  He reports that during the day the pain goes away completely but then in the evening starts to come back and it becomes severe when he lays down.  No shortness of breath, no cough, no hemoptysis.  No personal or family history of heart attacks, PE or DVT, no recent travel immobilization, no leg pain or swelling, no exogenous hormones.  Currently the pain is mild.  Past Medical History:  Diagnosis Date  . Herpes simplex type II infection 2003  . Myasthenia gravis associated with thymoma ([place])   . Restless leg syndrome     Patient Active Problem List   Diagnosis Date Noted  . Abdominal pain, chronic, right lower quadrant 09/29/2016  . 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 Asthma 08/01/2012  . Reflux 06/01/2012  . Conductive hearing loss in left ear 06/01/2012  . Herpes simplex type II infection   . Myasthenia gravis associated with thymoma ([place])   . Restless leg syndrome     Past Surgical History:  Procedure Laterality Date  . EYE MUSCLE SURGERY  2008   for strabismus, [facility],  [employer]  . SEPTOPLASTY  March 2013  . STRABISMUS SURGERY  2006  . TOTAL THYMECTOMY  1995    Prior to Admission medications   Medication Sig Start Date End  Date Taking? Authorizing Provider  alendronate (FOSAMAX) 35 MG tablet TAKE 1 TABLET BY MOUTH EVERY 7 DAYS 11/27/18   [name], MD  azathioprine (IMURAN) 100 MG tablet     [provider]  CALCIUM PO Take 500 mg by mouth daily before breakfast.     [provider]  Flaxseed, Linseed, (FLAXSEED OIL) 1000 MG CAPS Take by mouth daily.    [provider]  folic acid (FOLVITE) 1 MG tablet 1 tablet by mouth each day EXCEPT for the day you takes methotrexate 04/17/13   [provider]  Misc Natural Products (OSTEO BI-FLEX JOINT SHIELD PO) Take 1 tablet by mouth daily.     [provider]  Multiple Vitamin (MULTIVITAMIN) tablet Take 1 tablet by mouth daily.    [provider]  pramipexole (MIRAPEX) 0.5 MG tablet TAKE 2 TABLETS (1 MG TOTAL) BY MOUTH AT BEDTIME. 01/11/19   [name], MD  PREDNISONE PO Take 25 mg by mouth daily.    [provider]  Vitamin D, Ergocalciferol, (DRISDOL) 1.25 MG (50000 UT) CAPS capsule Take 1 capsule (50,000 Units total) by mouth every 7 (seven) days. 01/15/19   [name], MD    Allergies Cefadroxil  Family History  Problem Relation Age of Onset  . Heart disease Mother   . Heart disease Father   . Cancer Maternal Uncle  prostate  . Cancer Maternal Grandmother        colon    Social History Social History   Tobacco Use  . Smoking status: Never Smoker  . Smokeless tobacco: Never Used  Substance Use Topics  . Alcohol use: No  . Drug use: No    Review of Systems  Constitutional: Negative for fever. Eyes: Negative for visual changes. ENT: Negative for sore throat. Neck: No neck pain  Cardiovascular: + chest pain. Respiratory: Negative for shortness of breath. Gastrointestinal: Negative for abdominal pain, vomiting or diarrhea. Genitourinary: Negative for dysuria. Musculoskeletal: Negative for back pain. Skin: Negative for rash. Neurological: Negative for headaches,  weakness or numbness. Psych: No SI or HI  ____________________________________________   PHYSICAL EXAM:  VITAL SIGNS: ED Triage Vitals  Enc Vitals Group     BP 03/23/19 2103 109/67     Pulse Rate 03/23/19 2103 60     Resp 03/23/19 2103 18     Temp 03/23/19 2103 99.3 F (37.4 C)     Temp Source 03/23/19 2103 Oral     SpO2 --      Weight 03/23/19 2102 160 lb (72.6 kg)     Height 03/23/19 2102 5\' 6"  (1.676 m)     Head Circumference --      Peak Flow --      Pain Score 03/23/19 2102 2     Pain Loc --      Pain Edu? --      Excl. in [place]? --     Constitutional: Alert and oriented. Well appearing and in no apparent distress. HEENT:      Head: Normocephalic and atraumatic.         Eyes: Conjunctivae are normal. Sclera is non-icteric.       Mouth/Throat: Mucous membranes are moist.       Neck: Supple with no signs of meningismus. Cardiovascular: Regular rate and rhythm. No murmurs, gallops, or rubs. 2+ symmetrical distal pulses are present in all extremities. No JVD. Respiratory: Normal respiratory effort. Lungs are clear to auscultation bilaterally. No wheezes, crackles, or rhonchi.  Gastrointestinal: Soft, non tender, and non distended with positive bowel sounds. No rebound or guarding. Musculoskeletal: Nontender with normal range of motion in all extremities. No edema, cyanosis, or erythema of extremities. Neurologic: Normal speech and language. Face is symmetric. Moving all extremities. No gross focal neurologic deficits are appreciated. Skin: Skin is warm, dry and intact. No rash noted. Psychiatric: Mood and affect are normal. Speech and behavior are normal.  ____________________________________________   LABS (all labs ordered are listed, but only abnormal results are displayed)  Labs Reviewed  BASIC METABOLIC PANEL - Abnormal; Notable for the following components:      Result Value   Glucose, Bld 109 (*)    All other components within normal limits  CBC  TROPONIN I   TROPONIN I  FIBRIN DERIVATIVES D-DIMER ([facility] ONLY)   ____________________________________________  EKG  ED ECG REPORT I, Rudene Re, the attending physician, personally viewed and interpreted this ECG.  Normal sinus rhythm rate of 60, normal intervals, normal axis, no ST elevations or depressions.  Normal EKG unchanged from prior. ____________________________________________  RADIOLOGY  I have personally reviewed the images performed during this visit and I agree with the Radiologist's read.   Interpretation by Radiologist:  Dg Chest Portable 1 View  Result Date: 03/24/2019 CLINICAL DATA:  Chest pain EXAM: PORTABLE CHEST 1 VIEW COMPARISON:  05/31/2012 chest radiograph. FINDINGS: Intact sternotomy wires. Stable cardiomediastinal silhouette  with normal heart size. No pneumothorax. No pleural effusion. Lungs appear clear, with no acute consolidative airspace disease and no pulmonary edema. IMPRESSION: No active disease. Electronically Signed   By: Ilona Sorrel M.D.   On: 03/24/2019 04:39      ____________________________________________   PROCEDURES  Procedure(s) performed: None Procedures Critical Care performed:  None ____________________________________________   INITIAL IMPRESSION / ASSESSMENT AND PLAN / ED COURSE   53 y.o. male who presents for evaluation of ongoing and now worse chest pain x 5 days, worse at night, since being diagnosed with COVID.  Ddx gerd, PUD, PE, myocarditis, pericarditis. Patient is extremely well-appearing and in no distress, has normal vital signs, EKG shows no evidence of ischemia or myocarditis.  First troponin is negative. CXR showed no evidence of PNA, PTX, pulmonary edema. Repeat troponin negative. D-dimer negative. Bedside US showing normal EF with no evidence of pericardial effusion. GI cocktail improved the pain. Most likely GERD vs pericarditis. Recommended trying ibuprofen 600mg  TID for the next few days and if improving, to continue  until pain is resolved. Recommended taking it with meals to prevent ulcer disease. If patient is worsening recommended Pepcid instead for possible gerd. Discussed close follow-up with primary care doctor and standard return precautions for worsening chest pain or shortness of breath      As part of my medical decision making, I reviewed the following data within the [place] notes reviewed and incorporated, Labs reviewed , EKG interpreted , Old chart reviewed, Radiograph reviewed , Notes from prior ED visits and [place] Controlled Substance Database    Pertinent labs & imaging results that were available during my care of the patient were reviewed by me and considered in my medical decision making (see chart for details).    ____________________________________________   FINAL CLINICAL IMPRESSION(S) / ED DIAGNOSES  Final diagnoses:  Chest pain, unspecified type      NEW MEDICATIONS STARTED DURING THIS VISIT:  ED Discharge Orders    None       Note:  This document was prepared using Dragon voice recognition software and may include unintentional dictation errors.    Rudene Re, MD 03/24/19 [phone]

## 2019-03-24 NOTE — ED Notes (Signed)
X-ray at bedside

## 2019-03-24 NOTE — Discharge Instructions (Addendum)

## 2019-03-24 NOTE — ED Notes (Signed)
Patient called for a room with no answer. 

## 2019-03-26 ENCOUNTER — Telehealth: Payer: Self-pay

## 2019-03-26 NOTE — Telephone Encounter (Signed)
I spoke with patient to let him know that Dr. Derrel Nip had provided note for him to print from his mychart.

## 2019-03-27 ENCOUNTER — Telehealth: Payer: Self-pay

## 2019-03-27 NOTE — Telephone Encounter (Signed)
Letter has been corrected and pt is aware.

## 2019-03-27 NOTE — Telephone Encounter (Signed)
Copied from [place] [id number]. Topic: General - Other >> Mar 27, 2019  7:53 AM Carolyn Stare wrote: Pt received a return note to work thru [place] but date to return to work was incorrect, instead of saying return June 23 is said Jan 23 please correct

## 2019-04-23 DIAGNOSIS — J22 Unspecified acute lower respiratory infection: Secondary | ICD-10-CM

## 2019-04-23 DIAGNOSIS — U071 COVID-19: Secondary | ICD-10-CM

## 2019-05-03 LAB — SAR COV2 SEROLOGY (COVID19)AB(IGG),IA: SARS-CoV-2 Ab, IgG: NEGATIVE

## 2019-05-13 ENCOUNTER — Other Ambulatory Visit: Payer: Self-pay | Admitting: Internal Medicine

## 2019-05-25 ENCOUNTER — Encounter: Payer: Self-pay | Admitting: Internal Medicine

## 2019-05-25 ENCOUNTER — Ambulatory Visit (INDEPENDENT_AMBULATORY_CARE_PROVIDER_SITE_OTHER): Payer: 59 | Admitting: Internal Medicine

## 2019-05-25 ENCOUNTER — Other Ambulatory Visit: Payer: Self-pay

## 2019-05-25 ENCOUNTER — Telehealth: Payer: Self-pay | Admitting: Internal Medicine

## 2019-05-25 VITALS — Ht 66.0 in | Wt 160.0 lb

## 2019-05-25 DIAGNOSIS — I301 Infective pericarditis: Secondary | ICD-10-CM | POA: Diagnosis not present

## 2019-05-25 DIAGNOSIS — Z8619 Personal history of other infectious and parasitic diseases: Secondary | ICD-10-CM | POA: Diagnosis not present

## 2019-05-25 DIAGNOSIS — Z8616 Personal history of COVID-19: Secondary | ICD-10-CM

## 2019-05-25 NOTE — Progress Notes (Signed)
Virtual Visit via DOXY.ME Note  This visit type was conducted due to national recommendations for restrictions regarding the COVID-19 pandemic (e.g. social distancing).  This format is felt to be most appropriate for this patient at this time.  All issues noted in this document were discussed and addressed.  No physical exam was performed (except for noted visual exam findings with Video Visits).   I connected with@ on 05/25/19 at 11:00 AM EDT by a video enabled telemedicine application or telephone and verified that I am speaking with the correct person using two identifiers. Location patient: home Location provider: work or home office Persons participating in the virtual visit: patient, provider  I discussed the limitations, risks, security and privacy concerns of performing an evaluation and management service by telephone and the availability of in person appointments. I also discussed with the patient that there may be a patient responsible charge related to this service. The patient expressed understanding and agreed to proceed.  Reason for visit: follow up on perdicarditis caused by COVID 19 INFECTION   HPI:  53 yr old male with mysathenia gravis Diagnosed with covid 19 on June 5th after having a documented exposure at work earlier in the week.  He had a mild course that was treated at home,  But was complicated by persistent positional  chest pain consistent with  perdicarditis which has improved but persisted.  The diagnosis was supported by an ER physician on  June 20 when he sought evaluation , and myocarditis was ruled out  with negative toponins x 2.  Since then he has been intermittent with use of ibuprofen,  He notes that it does relieve the pain transiently, but he stopped taking it a while back (just forgot to keep taking  ) .  He denies orthopnea and dyspnea.  Has been able to play couples tennis,  mow the lawn, and lift weights without chest pain, dyspnea, or cough. The pain  returns with supine position only. GERD was also offered as a ddx  By ER physician,  But he denies symptoms of GERD at any other time and feels that this is an unlikely cause   ROS: Patient denies headache, fevers, malaise, unintentional weight loss, skin rash, eye pain, sinus congestion and sinus pain, sore throat, dysphagia,  hemoptysis , cough, dyspnea, wheezing, palpitations, orthopnea, edema, abdominal pain, nausea, melena, diarrhea, constipation, flank pain, dysuria, hematuria, urinary  Frequency, nocturia, numbness, tingling, seizures,  Focal weakness, Loss of consciousness,  Tremor, insomnia, depression, anxiety, and suicidal ideation.      Past Medical History:  Diagnosis Date  . Herpes simplex type II infection 2003  . Myasthenia gravis associated with thymoma ([place])   . Restless leg syndrome     Past Surgical History:  Procedure Laterality Date  . EYE MUSCLE SURGERY  2008   for strabismus, [facility],  [employer]  . SEPTOPLASTY  March 2013  . STRABISMUS SURGERY  2006  . TOTAL THYMECTOMY  1995    Family History  Problem Relation Age of Onset  . Heart disease Mother   . Heart disease Father   . Cancer Maternal Uncle        prostate  . Cancer Maternal Grandmother        colon    SOCIAL HX:  reports that he has never smoked. He has never used smokeless tobacco. He reports that he does not drink alcohol or use drugs.   Current Outpatient Medications:  .  alendronate (FOSAMAX) 35 MG tablet, TAKE  1 TABLET BY MOUTH ONE TIME PER WEEK, Disp: 12 tablet, Rfl: 1 .  azathioprine (IMURAN) 100 MG tablet, , Disp: , Rfl:  .  CALCIUM PO, Take 500 mg by mouth daily before breakfast. , Disp: , Rfl:  .  Flaxseed, Linseed, (FLAXSEED OIL) 1000 MG CAPS, Take by mouth daily., Disp: , Rfl:  .  folic acid (FOLVITE) 1 MG tablet, 1 tablet by mouth each day EXCEPT for the day you takes methotrexate, Disp: , Rfl:  .  Misc Natural Products (OSTEO BI-FLEX JOINT SHIELD PO), Take 1 tablet by mouth daily. ,  Disp: , Rfl:  .  Multiple Vitamin (MULTIVITAMIN) tablet, Take 1 tablet by mouth daily., Disp: , Rfl:  .  pramipexole (MIRAPEX) 0.5 MG tablet, TAKE 2 TABLETS (1 MG TOTAL) BY MOUTH AT BEDTIME., Disp: 60 tablet, Rfl: 5 .  PREDNISONE PO, Take 25 mg by mouth daily., Disp: , Rfl:  .  Vitamin D, Ergocalciferol, (DRISDOL) 1.25 MG (50000 UT) CAPS capsule, Take 1 capsule (50,000 Units total) by mouth every 7 (seven) days., Disp: 13 capsule, Rfl: 1  EXAM:  VITALS per patient if applicable:  GENERAL: alert, oriented, appears well and in no acute distress  HEENT: atraumatic, conjunttiva clear, no obvious abnormalities on inspection of external nose and ears  NECK: normal movements of the head and neck  LUNGS: on inspection no signs of respiratory distress, breathing rate appears normal, no obvious gross SOB, gasping or wheezing  CV: no obvious cyanosis  MS: moves all visible extremities without noticeable abnormality  PSYCH/NEURO: pleasant and cooperative, no obvious depression or anxiety, speech and thought processing grossly intact  ASSESSMENT AND PLAN:  Discussed the following assessment and plan:  Acute viral pericarditis  History of 2019 novel coronavirus disease (COVID-19)  Acute viral pericarditis Persistent symptoms since his COVID 19 INfection in early June.  June 20 ER evaluation with chest  X ray ,  EKG and enzymes x 2 ruled out pneumonia , myocarditis and acute MI.  He ha sno signs of constrictive pericarditis. Resume NSAID daily.  Cardiology evaluation offered but deferred for 2 more week s  History of 2019 novel coronavirus disease (COVID-19) diagnosed June 5 after a known exposure at work. Wife Peggy tested negative . several close contacts, residents of [place] also tested negative .  Interesting to note that his COVID IG AB test was negative in early August     I discussed the assessment and treatment plan with the patient. The patient was provided an opportunity to ask  questions and all were answered. The patient agreed with the plan and demonstrated an understanding of the instructions.   The patient was advised to call back or seek an in-person evaluation if the symptoms worsen or if the condition fails to improve as anticipated.  I provided 25 minutes of non-face-to-face time during this encounter.   [name], MD

## 2019-05-25 NOTE — Telephone Encounter (Signed)
Frank Cabrera,  None of the PPI's (stomach acid suppressors) ar e covered by your insurance for unclear reasons,  So rather than having you end up with a $60 bill at the pharmacy, just pick up generic Prilosec (omeprazole  20 mg) or generic Nexium (lansoprazole or esomeprazole , both 20 mg) at [employer] or wherever you shop, and take it daily in the morning before you eat.  For the anti inflammatory,  Choose Meloxicam 15 mg daily OR ibuprofen  400 to 600 mg every 8 hours.  (you can choose whichever works better )

## 2019-05-27 DIAGNOSIS — I301 Infective pericarditis: Secondary | ICD-10-CM | POA: Insufficient documentation

## 2019-05-27 DIAGNOSIS — Z8619 Personal history of other infectious and parasitic diseases: Secondary | ICD-10-CM | POA: Insufficient documentation

## 2019-05-27 DIAGNOSIS — Z8616 Personal history of COVID-19: Secondary | ICD-10-CM | POA: Insufficient documentation

## 2019-05-27 NOTE — Assessment & Plan Note (Signed)
diagnosed June 5 after a known exposure at work. Wife Peggy tested negative . several close contacts, residents of [place] also tested negative .  Interesting to note that his COVID IG AB test was negative in early August

## 2019-05-27 NOTE — Assessment & Plan Note (Signed)
Persistent symptoms since his COVID 19 INfection in early June.  June 20 ER evaluation with chest  X ray ,  EKG and enzymes x 2 ruled out pneumonia , myocarditis and acute MI.  He ha sno signs of constrictive pericarditis. Resume NSAID daily.  Cardiology evaluation offered but deferred for 2 more week s

## 2019-05-29 MED ORDER — OMEPRAZOLE 40 MG PO CPDR: 40.0000 mg | DELAYED_RELEASE_CAPSULE | Freq: Every day | ORAL | 0 refills | Status: DC

## 2019-06-21 ENCOUNTER — Other Ambulatory Visit: Payer: Self-pay | Admitting: Internal Medicine

## 2019-07-02 DIAGNOSIS — Z79899 Other long term (current) drug therapy: Secondary | ICD-10-CM

## 2019-07-11 ENCOUNTER — Other Ambulatory Visit: Payer: Self-pay | Admitting: Internal Medicine

## 2019-07-17 ENCOUNTER — Other Ambulatory Visit: Payer: Self-pay | Admitting: Internal Medicine

## 2019-07-23 ENCOUNTER — Other Ambulatory Visit: Payer: Self-pay | Admitting: Internal Medicine

## 2019-07-30 ENCOUNTER — Telehealth: Payer: Self-pay

## 2019-07-30 NOTE — Telephone Encounter (Signed)
LMTCB. Need to see if pt would be willing to schedule a virtual visit with Dr. Derrel Nip this morning to discuss enlarged lymph nodes.

## 2019-07-30 NOTE — Telephone Encounter (Signed)
Pt called back to schedule. I offered pt appt for 10/28 pt states he will wait it might be getting better he would need to check his work schedule. I also ask if he can see another provider pt wants to see D Tullo. Pt states he will cb.

## 2019-07-31 ENCOUNTER — Telehealth: Payer: Self-pay | Admitting: *Deleted

## 2019-07-31 NOTE — Telephone Encounter (Signed)
Copied from [place] [id number]. Topic: Appointment Scheduling - Scheduling Inquiry for Clinic >> Jul 31, 2019  4:42 PM Frank Cabrera wrote: Reason for CRM: pt would like Cabrera virtual appt with Frank Cabrera about some knots that he has noticed in his arm pits ,  she would like some possibly this week around 12 or so.   Please advise  Best number  [phone]

## 2019-08-01 ENCOUNTER — Other Ambulatory Visit (INDEPENDENT_AMBULATORY_CARE_PROVIDER_SITE_OTHER): Payer: 59

## 2019-08-01 ENCOUNTER — Other Ambulatory Visit: Payer: Self-pay

## 2019-08-01 DIAGNOSIS — Z79899 Other long term (current) drug therapy: Secondary | ICD-10-CM

## 2019-08-01 NOTE — Telephone Encounter (Signed)
I spoke with pt and he stated that he was ready to do a virtual visit with Dr. Derrel Nip to discuss the swollen lymph nodes. I have scheduled pt for Friday at 11:30am but pt stated that he would like to have some closer to lunch time. Would you want to schedule pt for tomorrow at 12:30 for a virtual visit?

## 2019-08-01 NOTE — Telephone Encounter (Signed)
See previous message this came in late yesterday afternoon have we spoken to patient, I tried to call this morning left message to call office.

## 2019-08-01 NOTE — Telephone Encounter (Signed)
No to 12:30 thursday  Ok to change 11:30 virtual on Friday  to 12:30 face to face on friday but block the 11:30

## 2019-08-02 LAB — COMPREHENSIVE METABOLIC PANEL
ALT: 12 U/L (ref 0–53)
AST: 18 U/L (ref 0–37)
Albumin: 4.1 g/dL (ref 3.5–5.2)
Alkaline Phosphatase: 46 U/L (ref 39–117)
BUN: 14 mg/dL (ref 6–23)
CO2: 28 mEq/L (ref 19–32)
Calcium: 9.1 mg/dL (ref 8.4–10.5)
Chloride: 104 mEq/L (ref 96–112)
Creatinine, Ser: 1.13 mg/dL (ref 0.40–1.50)
GFR: 68.4 mL/min (ref >60.00)
Glucose, Bld: 87 mg/dL (ref 70–99)
Potassium: 4.1 mEq/L (ref 3.5–5.1)
Sodium: 140 mEq/L (ref 135–145)
Total Bilirubin: 0.3 mg/dL (ref 0.2–1.2)
Total Protein: 6.5 g/dL (ref 6.0–8.3)

## 2019-08-02 LAB — CBC WITH DIFFERENTIAL/PLATELET
Basophils Absolute: 0.1 10*3/uL (ref 0.0–0.1)
Basophils Relative: 1 % (ref 0.0–3.0)
Eosinophils Absolute: 0.1 10*3/uL (ref 0.0–0.7)
Eosinophils Relative: 1.8 % (ref 0.0–5.0)
HCT: 40.6 % (ref 39.0–52.0)
Hemoglobin: 13.9 g/dL (ref 13.0–17.0)
Lymphocytes Relative: 23.7 % (ref 12.0–46.0)
Lymphs Abs: 1.6 10*3/uL (ref 0.7–4.0)
MCHC: 34.2 g/dL (ref 30.0–36.0)
MCV: 90.8 fl (ref 78.0–100.0)
Monocytes Absolute: 0.7 10*3/uL (ref 0.1–1.0)
Monocytes Relative: 10.1 % (ref 3.0–12.0)
Neutro Abs: 4.3 10*3/uL (ref 1.4–7.7)
Neutrophils Relative %: 63.4 % (ref 43.0–77.0)
Platelets: 315 10*3/uL (ref 150.0–400.0)
RBC: 4.47 Mil/uL (ref 4.22–5.81)
RDW: 14.2 % (ref 11.5–15.5)
WBC: 6.8 10*3/uL (ref 4.0–10.5)

## 2019-08-02 NOTE — Telephone Encounter (Signed)
Spoke with pt and he is scheduled for a face to face with Dr. Derrel Nip tomorrow at 12:30.

## 2019-08-03 ENCOUNTER — Other Ambulatory Visit: Payer: Self-pay

## 2019-08-03 ENCOUNTER — Ambulatory Visit: Payer: 59 | Admitting: Internal Medicine

## 2019-08-03 ENCOUNTER — Telehealth: Payer: Self-pay

## 2019-08-03 NOTE — Telephone Encounter (Signed)
Caryl Pina informed me patient came in office to day at 11:30 because apparently he called back about his appointment yesterday afternoon. He was told by [facility] to be here at 11:30. He was told by Janett Billow to ne here at 12:30. Then he received my voicemail asking him to be her at 1pm. He had customers to see he said & could not wait. He was asking to be worked in some place on your schedule. Please advise?

## 2019-08-03 NOTE — Telephone Encounter (Signed)
Frank Cabrera needs to hold the pe[employer] accountable for this mess.  Please have her handle.  If they told heim to come to office for 11:30 FTF , they they are at fault for the revenue lost today .  You can put him on my schedule for next week for a FTF at 4:00 on Monday  .    This patient needs to be examined!

## 2019-08-03 NOTE — Telephone Encounter (Signed)
Patient stated he cannot come in Monday so I ask him what day was good for him he said Wednesday so I gave him the 4:30 on Wednesday.

## 2019-08-05 ENCOUNTER — Telehealth: Payer: Self-pay | Admitting: Internal Medicine

## 2019-08-08 ENCOUNTER — Encounter: Payer: Self-pay | Admitting: Internal Medicine

## 2019-08-08 ENCOUNTER — Ambulatory Visit: Payer: 59 | Admitting: Internal Medicine

## 2019-08-08 ENCOUNTER — Other Ambulatory Visit: Payer: Self-pay

## 2019-08-08 DIAGNOSIS — Z8619 Personal history of other infectious and parasitic diseases: Secondary | ICD-10-CM

## 2019-08-08 DIAGNOSIS — R1011 Right upper quadrant pain: Secondary | ICD-10-CM | POA: Diagnosis not present

## 2019-08-08 DIAGNOSIS — R590 Localized enlarged lymph nodes: Secondary | ICD-10-CM | POA: Diagnosis not present

## 2019-08-08 DIAGNOSIS — Z8616 Personal history of COVID-19: Secondary | ICD-10-CM

## 2019-08-08 NOTE — Progress Notes (Signed)
Subjective:  Patient ID: Frank Cabrera, male    DOB: July 18, 1966  Age: 53 y.o. MRN: [id number]  CC: Diagnoses of Axillary lymphadenopathy, Abdominal pain, RUQ (right upper quadrant), and History of 2019 novel coronavirus disease (COVID-19) were pertinent to this visit.  HPI Frank Cabrera presents for evaluation of lymphadenopathy. Patient was diagnosed with COVID 19 infection March 11 2019 and developed pericarditis .  His symptoms have been improving but not completely resolved and about 4-5 weeks ago developed tender bilateral axillary LAD which was accompanied by transient folliculitis lasting only a few days . The symptoms of tenderness have resolved but he continues to have a tinging sensation which is uncomfortable bilaterally. During this time denies any acknowledged episose of sinusitis but did recall changing his antiperspirant to a "natural " deodorant.  His chest pain has never completely resolved,  But he has been able to  resume regular exercise   He also has been having Right sided inferocostal pain fr several weeks      Outpatient Medications Prior to Visit  Medication Sig Dispense Refill  . alendronate (FOSAMAX) 35 MG tablet TAKE 1 TABLET BY MOUTH ONE TIME PER WEEK 12 tablet 1  . azathioprine (IMURAN) 100 MG tablet     . CALCIUM PO Take 500 mg by mouth daily before breakfast.     . Flaxseed, Linseed, (FLAXSEED OIL) 1000 MG CAPS Take by mouth daily.    . folic acid (FOLVITE) 1 MG tablet 1 tablet by mouth each day EXCEPT for the day you takes methotrexate    . Misc Natural Products (OSTEO BI-FLEX JOINT SHIELD PO) Take 1 tablet by mouth daily.     . Multiple Vitamin (MULTIVITAMIN) tablet Take 1 tablet by mouth daily.    Marland Kitchen omeprazole (PRILOSEC) 40 MG capsule TAKE 1 CAPSULE BY MOUTH EVERY DAY 90 capsule 3  . pramipexole (MIRAPEX) 0.5 MG tablet TAKE 2 TABLETS (1 MG TOTAL) BY MOUTH AT BEDTIME. 180 tablet 1  . PREDNISONE PO Take 25 mg by mouth daily.    . Vitamin D, Ergocalciferol,  (DRISDOL) 1.25 MG (50000 UT) CAPS capsule TAKE 1 CAPSULE (50,000 UNITS TOTAL) BY MOUTH EVERY 7 (SEVEN) DAYS. 13 capsule 1   No facility-administered medications prior to visit.     Review of Systems;  Patient denies headache, fevers, malaise, unintentional weight loss, skin rash, eye pain, sinus congestion and sinus pain, sore throat, dysphagia,  hemoptysis , cough, dyspnea, wheezing, chest pain, palpitations, orthopnea, edema, abdominal pain, nausea, melena, diarrhea, constipation, flank pain, dysuria, hematuria, urinary  Frequency, nocturia, numbness, tingling, seizures,  Focal weakness, Loss of consciousness,  Tremor, insomnia, depression, anxiety, and suicidal ideation.      Objective:  BP (!) 82/60   Pulse 79   Temp (!) 97.5 F (36.4 C)   Wt 163 lb 9.6 oz (74.2 kg)   SpO2 97%   BMI 26.41 kg/m   BP Readings from Last 3 Encounters:  08/08/19 (!) 82/60  03/24/19 124/85  10/16/18 (!) 90/58    Wt Readings from Last 3 Encounters:  08/08/19 163 lb 9.6 oz (74.2 kg)  05/25/19 160 lb (72.6 kg)  03/23/19 160 lb (72.6 kg)    General appearance: alert, cooperative and appears stated age Ears: normal TM's and external ear canals both ears Throat: lips, mucosa, and tongue normal; teeth and gums normal Neck: no adenopathy, no carotid bruit, supple, symmetrical, trachea midline and thyroid not enlarged, symmetric, no tenderness/mass/nodules Back: symmetric, no curvature. ROM normal. No CVA  tenderness. Lungs: clear to auscultation bilaterally Heart: regular rate and rhythm, S1, S2 normal, no murmur, click, rub or gallop Abdomen: soft, non-tender; bowel sounds normal; no masses,  no organomegaly Pulses: 2+ and symmetric Skin: Skin color, texture, turgor normal. No rashes or lesions Lymph nodes: Cervical, supraclavicular, and axillary nodes normal.  No results found for: HGBA1C  Lab Results  Component Value Date   CREATININE 1.13 08/01/2019   CREATININE 0.91 03/23/2019    CREATININE 1.16 10/16/2018    Lab Results  Component Value Date   WBC 6.8 08/01/2019   HGB 13.9 08/01/2019   HCT 40.6 08/01/2019   PLT 315.0 08/01/2019   GLUCOSE 87 08/01/2019   CHOL 185 10/25/2017   TRIG 86.0 10/25/2017   HDL 70.10 10/25/2017   LDLCALC 97 10/25/2017   ALT 12 08/01/2019   AST 18 08/01/2019   NA 140 08/01/2019   K 4.1 08/01/2019   CL 104 08/01/2019   CREATININE 1.13 08/01/2019   BUN 14 08/01/2019   CO2 28 08/01/2019    Dg Chest Portable 1 View  Result Date: 03/24/2019 CLINICAL DATA:  Chest pain EXAM: PORTABLE CHEST 1 VIEW COMPARISON:  05/31/2012 chest radiograph. FINDINGS: Intact sternotomy wires. Stable cardiomediastinal silhouette with normal heart size. No pneumothorax. No pleural effusion. Lungs appear clear, with no acute consolidative airspace disease and no pulmonary edema. IMPRESSION: No active disease. Electronically Signed   By: Ilona Sorrel M.D.   On: 03/24/2019 04:39    Assessment & Plan:   Problem List Items Addressed This Visit      Unprioritized   History of 2019 novel coronavirus disease (COVID-19)    The long term effects Of hIS COVID 103 INFECTION unknown and was discussed with patient today       Axillary lymphadenopathy    Exam is normal today  And CBC was normal last week.  Reassurance provided       Abdominal pain, RUQ (right upper quadrant)    Etiology appears to be MSK as he has no history of liver or GB disease and labs were normal last week         I am having [name] maintain his Flaxseed Oil, multivitamin, Misc Natural Products (OSTEO BI-FLEX JOINT SHIELD PO), CALCIUM PO, folic acid, PREDNISONE PO, azathioprine, alendronate, pramipexole, omeprazole, and Vitamin D (Ergocalciferol).  No orders of the defined types were placed in this encounter.   There are no discontinued medications.  Follow-up: No follow-ups on file.   [name], MD

## 2019-08-08 NOTE — Assessment & Plan Note (Addendum)
Etiology appears to be MSK as he has no history of liver or GB disease and labs were normal last week

## 2019-08-08 NOTE — Assessment & Plan Note (Signed)
Exam is normal today  And CBC was normal last week.  Reassurance provided

## 2019-08-08 NOTE — Assessment & Plan Note (Signed)
The long term effects Of hIS COVID 30 INFECTION unknown and was discussed with patient today

## 2019-10-07 ENCOUNTER — Other Ambulatory Visit: Payer: Self-pay | Admitting: Internal Medicine

## 2019-10-08 MED ORDER — OMEPRAZOLE 40 MG PO CPDR: DELAYED_RELEASE_CAPSULE | ORAL | 0 refills | Status: DC

## 2019-10-21 DIAGNOSIS — M1611 Unilateral primary osteoarthritis, right hip: Secondary | ICD-10-CM | POA: Insufficient documentation

## 2019-11-01 ENCOUNTER — Ambulatory Visit (INDEPENDENT_AMBULATORY_CARE_PROVIDER_SITE_OTHER): Payer: 59 | Admitting: Cardiovascular Disease

## 2019-11-01 ENCOUNTER — Encounter: Payer: Self-pay | Admitting: Cardiovascular Disease

## 2019-11-01 ENCOUNTER — Other Ambulatory Visit: Payer: Self-pay

## 2019-11-01 VITALS — BP 100/78 | HR 53 | Ht 66.0 in | Wt 163.8 lb

## 2019-11-01 DIAGNOSIS — R072 Precordial pain: Secondary | ICD-10-CM | POA: Diagnosis not present

## 2019-11-01 DIAGNOSIS — R002 Palpitations: Secondary | ICD-10-CM | POA: Diagnosis not present

## 2019-11-01 NOTE — Progress Notes (Signed)
Cardiology Office Note   Date:  11/01/2019   ID:  Frank Cabrera, DOB [birth year], MRN [id number]  PCP:  [name], MD  Cardiologist:   Kathlyn Sacramento, MD   No chief complaint on file.     History of Present Illness: Frank Cabrera is a 54 y.o. male who was referred by Dr. Derrel Nip for evaluation of chest pain. The patient has no previous cardiac history. He has known history of myasthenia gravis currently being treated with Imuran and prednisone. He has been on steroids for many years. He has no history of diabetes, hypertension or hyperlipidemia. He is not a smoker. There is family history of atrial fibrillation but no coronary artery disease. He was seen by me in 2017 for atypical chest pain.  I requested an echo and a stress test at that time but his symptoms resolved before then and thus these tests were canceled. He was diagnosed with COVID-19 in May and his symptoms manifested mainly by fatigue and chest pain.  The chest pain was pleuritic and was worse with lying down.  He did not require hospitalization.  His chest pain worsened in June and he had an emergency room visit for back.  His troponin was negative.  Chest x-ray was unremarkable.  Bedside ultrasound was done by the ED physician which showed no significant pericardial effusion.  He was discharged home on ibuprofen and he continued to take that intermittently.  He had more chest pain about 1 month ago and he continues to have intermittent symptoms.  He describes substernal aching and tightness feeling.  This can last sometimes a whole day but most of the time of last anywhere from 15 minutes to few hours.  In addition, he feels some discomfort in the back of his neck.  He has occasional palpitations.  He plays tennis on Saturdays and has been doing reasonably well with that.  However, after he finishes playing, he feels more chest discomfort. He needs to have right hip surgery in the near future.   Past Medical History:    Diagnosis Date  . Herpes simplex type II infection 2003  . Myasthenia gravis associated with thymoma ([place])   . Restless leg syndrome     Past Surgical History:  Procedure Laterality Date  . EYE MUSCLE SURGERY  2008   for strabismus, [facility],  [employer]  . SEPTOPLASTY  March 2013  . STRABISMUS SURGERY  2006  . TOTAL THYMECTOMY  1995     Current Outpatient Medications  Medication Sig Dispense Refill  . alendronate (FOSAMAX) 35 MG tablet TAKE 1 TABLET BY MOUTH ONE TIME PER WEEK 12 tablet 1  . azathioprine (IMURAN) 100 MG tablet Take 100 mg by mouth daily.     Marland Kitchen CALCIUM PO Take 500 mg by mouth daily before breakfast.     . Flaxseed, Linseed, (FLAXSEED OIL) 1000 MG CAPS Take by mouth daily.    . folic acid (FOLVITE) 1 MG tablet 1 tablet by mouth each day EXCEPT for the day you takes methotrexate    . Misc Natural Products (OSTEO BI-FLEX JOINT SHIELD PO) Take 1 tablet by mouth daily.     . Multiple Vitamin (MULTIVITAMIN) tablet Take 1 tablet by mouth daily.    Marland Kitchen omeprazole (PRILOSEC) 40 MG capsule TAKE 1 CAPSULE BY MOUTH EVERY DAY ON EN EMPTY STOMACH 90 capsule 0  . pramipexole (MIRAPEX) 0.5 MG tablet TAKE 2 TABLETS (1 MG TOTAL) BY MOUTH AT BEDTIME. 180 tablet 1  .  PREDNISONE PO Take 25 mg by mouth daily.    . Vitamin D, Ergocalciferol, (DRISDOL) 1.25 MG (50000 UT) CAPS capsule TAKE 1 CAPSULE (50,000 UNITS TOTAL) BY MOUTH EVERY 7 (SEVEN) DAYS. 13 capsule 1   No current facility-administered medications for this visit.    Allergies:   Cefadroxil    Social History:  The patient  reports that he has never smoked. He has never used smokeless tobacco. He reports that he does not drink alcohol or use drugs.   Family History:  The patient's family history includes Arrhythmia in his father; Cancer in his maternal grandmother and maternal uncle; Heart disease in his father and mother.    ROS:  Please see the history of present illness.   Otherwise, review of systems are positive for none.    All other systems are reviewed and negative.    PHYSICAL EXAM: VS:  BP 100/78 (BP Location: Right Arm, Patient Position: Sitting, Cuff Size: Normal)   Pulse (!) 53   Ht 5\' 6"  (1.676 m)   Wt 163 lb 12 oz (74.3 kg)   SpO2 97%   BMI 26.43 kg/m  , BMI Body mass index is 26.43 kg/m. GEN: Well nourished, well developed, in no acute distress  HEENT: normal  Neck: no JVD, carotid bruits, or masses Cardiac: RRR; no murmurs, rubs, or gallops,no edema  Respiratory:  clear to auscultation bilaterally, normal work of breathing GI: soft, nontender, nondistended, + BS MS: no deformity or atrophy  Skin: warm and dry, no rash Neuro:  Strength and sensation are intact Psych: euthymic mood, full affect   EKG:  EKG is not ordered today. Recent EKG was reviewed which showed sinus bradycardia with no significant ST or T wave changes.   Recent Labs: 08/01/2019: ALT 12; BUN 14; Creatinine, Ser 1.13; Hemoglobin 13.9; Platelets 315.0; Potassium 4.1; Sodium 140    Lipid Panel    Component Value Date/Time   CHOL 185 10/25/2017 0743   CHOL 189 10/01/2015 1025   TRIG 86.0 10/25/2017 0743   HDL 70.10 10/25/2017 0743   HDL 70 10/01/2015 1025   CHOLHDL 3 10/25/2017 0743   VLDL 17.2 10/25/2017 0743   LDLCALC 97 10/25/2017 0743   LDLCALC 104 (H) 10/01/2015 1025      Wt Readings from Last 3 Encounters:  11/01/19 163 lb 12 oz (74.3 kg)  08/08/19 163 lb 9.6 oz (74.2 kg)  05/25/19 160 lb (72.6 kg)       ASSESSMENT AND PLAN:  1.  Chest pain of unclear etiology: This started in the setting of COVID-19 infection and his symptoms at that time were pleuritic in nature and highly suggestive of pericarditis.  However, symptoms persisted now more than 6 months since his initial infection.  Thus, I think we have to exclude pericardial disease.  I requested an echocardiogram. In addition, he is having more chest pain after he exercises and thus this requires further ischemic evaluation.  I requested CTA of  the coronary arteries with FFR.  He is chronically bradycardic and likely does not require beta-blocker before the test.  2. Myasthenia gravis: Symptoms are controlled with Imuran and prednisone.  3.  Palpitations: Seems to be mild overall.  If symptoms worsen, outpatient monitor will be obtained.  Disposition:   FU with me as needed.   Signed,  Kathlyn Sacramento, MD  11/01/2019 9:35 AM    Marie

## 2019-11-01 NOTE — Patient Instructions (Addendum)
Medication Instructions:  Your physician recommends that you continue on your current medications as directed. Please refer to the Current Medication list given to you today.  *If you need a refill on your cardiac medications before your next appointment, please call your pharmacy*  Lab Work: You will need lab work (bmet) prior to your Cardiac CT. Please have your lab drawn at the [facility] 2-3 days prior to your CT. You do not need an appointment. Their hours are Mon- Fri 7am-6pm.  If you have labs (blood work) drawn today and your tests are completely normal, you will receive your results only by: Marland Kitchen MyChart Message (if you have MyChart) OR . A paper copy in the mail If you have any lab test that is abnormal or we need to change your treatment, we will call you to review the results.  Testing/Procedures: Your physician has requested that you have an echocardiogram. Echocardiography is a painless test that uses sound waves to create images of your heart. It provides your doctor with information about the size and shape of your heart and how well your heart's chambers and valves are working. This procedure takes approximately one hour. There are no restrictions for this procedure.     Your physician has requested that you have cardiac CT. Cardiac computed tomography (CT) is a painless test that uses an x-ray machine to take clear, detailed pictures of your heart. For further information please visit [email or web address].[email or web address]. Please follow instruction sheet as given.     Follow-Up: At [facility], you and your health needs are our priority.  As part of our continuing mission to provide you with exceptional heart care, we have created designated Provider Care Teams.  These Care Teams include your primary Cardiologist (physician) and Advanced Practice Providers (APPs -  Physician Assistants and Nurse Practitioners) who all work together to provide you with the care you need, when you  need it.  Your next appointment:   As needed   The format for your next appointment:   In Person  Provider:    You may see  Dr. Fletcher Anon or one of the following Advanced Practice Providers on your designated Care Team:    Murray Hodgkins, NP  Christell Faith, PA-C  Marrianne Mood, PA-C   Other Instructions Your cardiac CT will be scheduled at one of the below locations:   [facility] [address] [place], [place] [address] [phone]  [place] [address] [place], [place] [address] [phone]  If scheduled at [facility], please arrive at the [facility] main entrance of [facility] 30-45 minutes prior to test start time. Proceed to the [facility] Radiology Department (first floor) to check-in and test prep.  If scheduled at [facility], please arrive 15 mins early for check-in and test prep.  Please follow these instructions carefully (unless otherwise directed):  Hold all erectile dysfunction medications at least 3 days (72 hrs) prior to test.  On the Night Before the Test: . Be sure to Drink plenty of water. . Do not consume any caffeinated/decaffeinated beverages or chocolate 12 hours prior to your test. . Do not take any antihistamines 12 hours prior to your test. I On the Day of the Test: . Drink plenty of water. Do not drink any water within one hour of the test. . Do not eat any food 4 hours prior to the test. . You may take  your regular medications prior to the test.        After the Test: . Drink plenty of water. . After receiving IV contrast, you may experience a mild flushed feeling. This is normal. . On occasion, you may experience a mild rash up to 24 hours after the test. This is not dangerous. If this occurs, you can take Benadryl 25 mg and increase your fluid intake. . If you experience trouble breathing, this can be serious. If it  is severe call 911 IMMEDIATELY. If it is mild, please call our office. . If you take any of these medications: Glipizide/Metformin, Avandament, Glucavance, please do not take 48 hours after completing test unless otherwise instructed.   Once we have confirmed authorization from your insurance company, we will call you to set up a date and time for your test.   For non-scheduling related questions, please contact the cardiac imaging nurse navigator should you have any questions/concerns: Marchia Bond, RN Navigator Cardiac Imaging [facility] [phone] Office

## 2019-11-22 ENCOUNTER — Other Ambulatory Visit: Payer: 59

## 2019-11-29 ENCOUNTER — Other Ambulatory Visit: Payer: Self-pay

## 2019-11-29 ENCOUNTER — Ambulatory Visit (INDEPENDENT_AMBULATORY_CARE_PROVIDER_SITE_OTHER): Payer: 59

## 2019-11-29 DIAGNOSIS — R072 Precordial pain: Secondary | ICD-10-CM

## 2020-01-03 ENCOUNTER — Ambulatory Visit: Admission: RE | Admit: 2020-01-03 | Payer: 59 | Source: Ambulatory Visit

## 2020-01-08 ENCOUNTER — Other Ambulatory Visit: Payer: Self-pay | Admitting: Internal Medicine

## 2020-01-23 ENCOUNTER — Telehealth (HOSPITAL_COMMUNITY): Payer: Self-pay | Admitting: Emergency Medicine

## 2020-01-23 ENCOUNTER — Encounter (HOSPITAL_COMMUNITY): Payer: Self-pay

## 2020-01-23 NOTE — Telephone Encounter (Signed)
Reaching out to patient to offer assistance regarding upcoming cardiac imaging study; pt verbalizes understanding of appt date/time, parking situation and where to check in, pre-test NPO status and medications ordered, and verified current allergies; name and call back number provided for further questions should they arise Yulisa Chirico RN Navigator Cardiac Imaging [facility] [phone] office [phone] cell 

## 2020-01-24 ENCOUNTER — Ambulatory Visit
Admission: RE | Admit: 2020-01-24 | Discharge: 2020-01-24 | Disposition: A | Discharge status: Home / Self Care | Payer: No Typology Code available for payment source | Source: Ambulatory Visit | Attending: Cardiovascular Disease | Admitting: Cardiovascular Disease

## 2020-01-24 ENCOUNTER — Other Ambulatory Visit: Payer: Self-pay

## 2020-01-24 DIAGNOSIS — R072 Precordial pain: Secondary | ICD-10-CM

## 2020-01-24 MED ORDER — NITROGLYCERIN 0.4 MG SL SUBL
0.8000 mg | SUBLINGUAL_TABLET | Freq: Once | SUBLINGUAL | Status: AC
  Administered 2020-01-24: 0.8 mg via SUBLINGUAL

## 2020-01-24 MED ORDER — IOHEXOL 350 MG/ML SOLN
75.0000 mL | Freq: Once | INTRAVENOUS | Status: AC | PRN
  Administered 2020-01-24: 75 mL via INTRAVENOUS

## 2020-01-24 NOTE — Progress Notes (Signed)
Patient tolerated CT well. States developed a headache after nitro administration achy 5/10. Drank water and a coke after along with eating crackers and peanut butter. Headache now 2/10 achy feels better. Ambulatory steady gait to exit.

## 2020-01-25 ENCOUNTER — Telehealth: Payer: Self-pay

## 2020-01-25 ENCOUNTER — Encounter: Payer: Self-pay | Admitting: Internal Medicine

## 2020-01-25 DIAGNOSIS — I2510 Atherosclerotic heart disease of native coronary artery without angina pectoris: Secondary | ICD-10-CM | POA: Insufficient documentation

## 2020-01-25 DIAGNOSIS — R931 Abnormal findings on diagnostic imaging of heart and coronary circulation: Secondary | ICD-10-CM

## 2020-01-25 MED ORDER — ASPIRIN EC 81 MG PO TBEC: 81.0000 mg | DELAYED_RELEASE_TABLET | Freq: Every day | ORAL | Status: DC

## 2020-01-25 NOTE — Telephone Encounter (Signed)
-----   Message from Wellington Hampshire, MD sent at 01/25/2020 10:33 AM EDT ----- Inform patient that cardiac CTA showed only mild disease in the proximal LAD which does not seem to be flow-limiting.  This should not be causing any chest pain.  His calcium score was elevated.  He does have evidence of atherosclerosis.  I recommend checking fasting lipid and liver profile as most likely he will require treatment with a statin.  He should also take aspirin 81 mg daily.

## 2020-01-25 NOTE — Telephone Encounter (Signed)
Patient made aware of Cardiac CTA results and Dr. Tyrell Antonio recommendation. Patient is agreeable with the plan of care. He will start Asa 81 mg qd. Patient will having fasting lipid and lft drawn at the medical mall.  Patient questions answered to the best of my ability. Attempted to schedule the patient for a routine f/u appt. Could not find an agreeable date and time. Patient sts that he will call back to schedule.

## 2020-01-31 ENCOUNTER — Other Ambulatory Visit
Admission: RE | Admit: 2020-01-31 | Discharge: 2020-01-31 | Disposition: A | Discharge status: Home / Self Care | Payer: No Typology Code available for payment source | Attending: Cardiovascular Disease | Admitting: Cardiovascular Disease

## 2020-01-31 DIAGNOSIS — R931 Abnormal findings on diagnostic imaging of heart and coronary circulation: Secondary | ICD-10-CM

## 2020-01-31 DIAGNOSIS — R072 Precordial pain: Secondary | ICD-10-CM | POA: Diagnosis present

## 2020-01-31 LAB — BASIC METABOLIC PANEL
Anion gap: 6 (ref 5–15)
BUN: 14 mg/dL (ref 6–20)
CO2: 29 mmol/L (ref 22–32)
Calcium: 8.5 mg/dL — ABNORMAL LOW (ref 8.9–10.3)
Chloride: 105 mmol/L (ref 98–111)
Creatinine, Ser: 0.88 mg/dL (ref 0.61–1.24)
GFR calc Af Amer: >60 mL/min (ref >60)
GFR calc non Af Amer: >60 mL/min (ref >60)
Glucose, Bld: 85 mg/dL (ref 70–99)
Potassium: 3.4 mmol/L — ABNORMAL LOW (ref 3.5–5.1)
Sodium: 140 mmol/L (ref 135–145)

## 2020-01-31 LAB — HEPATIC FUNCTION PANEL
ALT: 14 U/L (ref 0–44)
AST: 15 U/L (ref 15–41)
Albumin: 3.8 g/dL (ref 3.5–5.0)
Alkaline Phosphatase: 39 U/L (ref 38–126)
Bilirubin, Direct: <0.1 mg/dL (ref 0.0–0.2)
Total Bilirubin: 0.7 mg/dL (ref 0.3–1.2)
Total Protein: 6.5 g/dL (ref 6.5–8.1)

## 2020-01-31 LAB — LIPID PANEL
Cholesterol: 200 mg/dL (ref 0–200)
HDL: 77 mg/dL (ref >40)
LDL Cholesterol: 112 mg/dL — ABNORMAL HIGH (ref 0–99)
Total CHOL/HDL Ratio: 2.6 RATIO
Triglycerides: 54 mg/dL (ref <150)
VLDL: 11 mg/dL (ref 0–40)

## 2020-02-04 ENCOUNTER — Other Ambulatory Visit: Payer: Self-pay

## 2020-02-04 DIAGNOSIS — E785 Hyperlipidemia, unspecified: Secondary | ICD-10-CM

## 2020-02-04 MED ORDER — ROSUVASTATIN CALCIUM 10 MG PO TABS: 10.0000 mg | ORAL_TABLET | Freq: Every day | ORAL | 1 refills | Status: DC

## 2020-02-19 ENCOUNTER — Encounter: Payer: Self-pay | Admitting: Cardiovascular Disease

## 2020-02-19 ENCOUNTER — Ambulatory Visit (INDEPENDENT_AMBULATORY_CARE_PROVIDER_SITE_OTHER): Payer: No Typology Code available for payment source | Admitting: Cardiovascular Disease

## 2020-02-19 ENCOUNTER — Other Ambulatory Visit: Payer: Self-pay

## 2020-02-19 VITALS — BP 94/60 | HR 68 | Ht 66.0 in | Wt 159.1 lb

## 2020-02-19 DIAGNOSIS — R931 Abnormal findings on diagnostic imaging of heart and coronary circulation: Secondary | ICD-10-CM | POA: Diagnosis not present

## 2020-02-19 DIAGNOSIS — E785 Hyperlipidemia, unspecified: Secondary | ICD-10-CM | POA: Diagnosis not present

## 2020-02-19 MED ORDER — ROSUVASTATIN CALCIUM 5 MG PO TABS: 5.0000 mg | ORAL_TABLET | Freq: Every day | ORAL | 5 refills | Status: DC

## 2020-02-19 NOTE — Progress Notes (Signed)
Cardiology Office Note   Date:  02/19/2020   ID:  Frank Cabrera, DOB 10/19/1965, MRN [id number]  PCP:  [name], MD  Cardiologist:   Kathlyn Sacramento, MD   Chief Complaint  Patient presents with  . office visit    Pt wants to dicuss blood work, ECHO, CT. Meds verbally reviewed w/ pt.      History of Present Illness: Frank Cabrera is a 54 y.o. male who is here today for follow-up visit regarding recent atypical chest pain and abnormal cardiac CTA.   He has known history of myasthenia gravis currently being treated with Imuran and prednisone. He has been on steroids for many years. He has no history of diabetes, hypertension or hyperlipidemia. He is not a smoker. There is family history of atrial fibrillation but no coronary artery disease. He was seen by me in 2017 for atypical chest pain.  I requested an echo and a stress test at that time but his symptoms resolved before then and thus these tests were canceled. He was diagnosed with COVID-19 in May of 2020 and his symptoms manifested mainly by fatigue and chest pain.  The chest pain was pleuritic and was worse with lying down.  He did not require hospitalization.  His chest pain worsened in June and he had an emergency room visit for back.  His troponin was negative.  Chest x-ray was unremarkable.    He was discharged home on ibuprofen and he continued to take that intermittently.   He was seen recently for continued atypical chest pain.  I proceeded with an echocardiogram which showed normal LV systolic function and no evidence of pericardial effusion.  No significant valvular abnormalities.  The study was overall normal.  CTA of the coronary arteries was performed which showed a calcium score of 73 which was in the 52 percentile for age and sex matched group control.  There was mild calcification in the proximal LAD causing mild stenosis.  Based on this, I felt that his chest pain was not related to obstructive coronary artery disease  but he does have evidence of atherosclerosis.  I recommended that he start aspirin 81 mg daily and also start treatment with a low potency statin.  Rosuvastatin was prescribed but the patient did not start the medication as he was concerned about potential side effects.  He is feeling better overall with no recurrent chest pain or shortness of breath.   Past Medical History:  Diagnosis Date  . Herpes simplex type II infection 2003  . Myasthenia gravis associated with thymoma ([place])   . Restless leg syndrome     Past Surgical History:  Procedure Laterality Date  . EYE MUSCLE SURGERY  2008   for strabismus, [facility],  [employer]  . SEPTOPLASTY  March 2013  . STRABISMUS SURGERY  2006  . TOTAL THYMECTOMY  1995     Current Outpatient Medications  Medication Sig Dispense Refill  . alendronate (FOSAMAX) 35 MG tablet TAKE 1 TABLET BY MOUTH ONE TIME PER WEEK 12 tablet 1  . aspirin EC 81 MG tablet Take 1 tablet (81 mg total) by mouth daily.    Marland Kitchen azathioprine (IMURAN) 100 MG tablet Take 100 mg by mouth daily.     Marland Kitchen CALCIUM PO Take 500 mg by mouth daily before breakfast.     . Flaxseed, Linseed, (FLAXSEED OIL) 1000 MG CAPS Take by mouth daily.    . folic acid (FOLVITE) 1 MG tablet 1 tablet by mouth  each day EXCEPT for the day you takes methotrexate    . Misc Natural Products (OSTEO BI-FLEX JOINT SHIELD PO) Take 1 tablet by mouth daily.     . Multiple Vitamin (MULTIVITAMIN) tablet Take 1 tablet by mouth daily.    . pramipexole (MIRAPEX) 0.5 MG tablet TAKE 2 TABLETS (1 MG TOTAL) BY MOUTH AT BEDTIME. 180 tablet 1  . PREDNISONE PO Take 25 mg by mouth daily.    . rosuvastatin (CRESTOR) 10 MG tablet Take 1 tablet (10 mg total) by mouth daily. 90 tablet 1  . Vitamin D, Ergocalciferol, (DRISDOL) 1.25 MG (50000 UT) CAPS capsule TAKE 1 CAPSULE (50,000 UNITS TOTAL) BY MOUTH EVERY 7 (SEVEN) DAYS. 13 capsule 1   No current facility-administered medications for this visit.    Allergies:   Cefadroxil     Social History:  The patient  reports that he has never smoked. He has never used smokeless tobacco. He reports that he does not drink alcohol or use drugs.   Family History:  The patient's family history includes Arrhythmia in his father; Cancer in his maternal grandmother and maternal uncle; Heart disease in his father and mother.    ROS:  Please see the history of present illness.   Otherwise, review of systems are positive for none.   All other systems are reviewed and negative.    PHYSICAL EXAM: VS:  BP 94/60 (BP Location: Left Arm, Patient Position: Sitting, Cuff Size: Normal)   Ht 5\' 6"  (1.676 m)   Wt 159 lb 2 oz (72.2 kg)   SpO2 99%   BMI 25.68 kg/m  , BMI Body mass index is 25.68 kg/m. GEN: Well nourished, well developed, in no acute distress  HEENT: normal  Neck: no JVD, carotid bruits, or masses Cardiac: RRR; no murmurs, rubs, or gallops,no edema  Respiratory:  clear to auscultation bilaterally, normal work of breathing GI: soft, nontender, nondistended, + BS MS: no deformity or atrophy  Skin: warm and dry, no rash Neuro:  Strength and sensation are intact Psych: euthymic mood, full affect   EKG:  EKG is not ordered today.    Recent Labs: 08/01/2019: Hemoglobin 13.9; Platelets 315.0 01/31/2020: ALT 14; BUN 14; Creatinine, Ser 0.88; Potassium 3.4; Sodium 140    Lipid Panel    Component Value Date/Time   CHOL 200 01/31/2020 0823   CHOL 189 10/01/2015 1025   TRIG 54 01/31/2020 0823   HDL 77 01/31/2020 0823   HDL 70 10/01/2015 1025   CHOLHDL 2.6 01/31/2020 0823   VLDL 11 01/31/2020 0823   LDLCALC 112 (H) 01/31/2020 0823   LDLCALC 104 (H) 10/01/2015 1025      Wt Readings from Last 3 Encounters:  02/19/20 159 lb 2 oz (72.2 kg)  11/01/19 163 lb 12 oz (74.3 kg)  08/08/19 163 lb 9.6 oz (74.2 kg)       ASSESSMENT AND PLAN:  1.  Noncardiac chest pain: Negative work-up including echocardiogram and cardiac CTA.  Fortunately, his symptoms have almost  completely resolved.    2.  Coronary atherosclerosis: The patient was found to have mildly elevated calcium score with mild proximal LAD stenosis.  Due to this, I recommend aggressive treatment of his risk factors.  Continue aspirin 81 mg daily.  I discussed with him the rationale for treatment with a statin.  3.  Hyperlipidemia: His LDL was only mildly elevated at 112.  However, given the recent diagnosis of coronary atherosclerosis, we should try to get his LDL to below 70.  I elected to add rosuvastatin 5 mg daily.  Check lipid and liver profile in 2 months.  4. Myasthenia gravis: Symptoms are controlled with Imuran and prednisone.    Disposition:   FU with me in 1 year  Signed,  Kathlyn Sacramento, MD  02/19/2020 2:23 PM    [place]

## 2020-02-19 NOTE — Patient Instructions (Signed)
Medication Instructions:  Your physician has recommended you make the following change in your medication:   START Crestor (rosuvastatin) 5 mg daily. An Rx has been sent to your pharmacy.  *If you need a refill on your cardiac medications before your next appointment, please call your pharmacy*   Lab Work: Your physician recommends that you return for a FASTING lipid profile and hepatic panel in 2 months.  Please have your lab drawn at the [facility] medical mall. You do not need an appointment. Lab hours are Mon-Fri 7am-6pm.    If you have labs (blood work) drawn today and your tests are completely normal, you will receive your results only by: Marland Kitchen MyChart Message (if you have MyChart) OR . A paper copy in the mail If you have any lab test that is abnormal or we need to change your treatment, we will call you to review the results.   Testing/Procedures: None ordered   Follow-Up: At [facility], you and your health needs are our priority.  As part of our continuing mission to provide you with exceptional heart care, we have created designated Provider Care Teams.  These Care Teams include your primary Cardiologist (physician) and Advanced Practice Providers (APPs -  Physician Assistants and Nurse Practitioners) who all work together to provide you with the care you need, when you need it.  We recommend signing up for the patient portal called "MyChart".  Sign up information is provided on this After Visit Summary.  MyChart is used to connect with patients for Virtual Visits (Telemedicine).  Patients are able to view lab/test results, encounter notes, upcoming appointments, etc.  Non-urgent messages can be sent to your provider as well.   To learn more about what you can do with MyChart, go to [email or web address].    Your next appointment:   12 month(s)  The format for your next appointment:   In Person  Provider:    You may see Dr. Fletcher Anon or one of the following Advanced Practice  Providers on your designated Care Team:    Murray Hodgkins, NP  Christell Faith, PA-C  Marrianne Mood, PA-C    Other Instructions N/A

## 2020-02-25 NOTE — Telephone Encounter (Signed)
Omeprazole is not in the pt's current medication list. Is it okay to refill? Is it ok to order labs for Dr. Tyrell Antonio office in two months?

## 2020-02-27 ENCOUNTER — Other Ambulatory Visit: Payer: Self-pay | Admitting: Internal Medicine

## 2020-02-27 DIAGNOSIS — I2510 Atherosclerotic heart disease of native coronary artery without angina pectoris: Secondary | ICD-10-CM

## 2020-02-27 MED ORDER — OMEPRAZOLE 40 MG PO CPDR: DELAYED_RELEASE_CAPSULE | ORAL | 0 refills | Status: DC

## 2020-03-21 ENCOUNTER — Other Ambulatory Visit: Payer: Self-pay | Admitting: Internal Medicine

## 2020-03-27 ENCOUNTER — Other Ambulatory Visit: Payer: Self-pay | Admitting: Internal Medicine

## 2020-04-23 ENCOUNTER — Other Ambulatory Visit (INDEPENDENT_AMBULATORY_CARE_PROVIDER_SITE_OTHER): Payer: No Typology Code available for payment source

## 2020-04-23 ENCOUNTER — Other Ambulatory Visit: Payer: Self-pay

## 2020-04-23 DIAGNOSIS — I2510 Atherosclerotic heart disease of native coronary artery without angina pectoris: Secondary | ICD-10-CM | POA: Diagnosis not present

## 2020-04-23 LAB — LIPID PANEL
Cholesterol: 143 mg/dL (ref 0–200)
HDL: 62.8 mg/dL (ref >39.00)
LDL Cholesterol: 71 mg/dL (ref 0–99)
NonHDL: 79.8
Total CHOL/HDL Ratio: 2
Triglycerides: 44 mg/dL (ref 0.0–149.0)
VLDL: 8.8 mg/dL (ref 0.0–40.0)

## 2020-04-23 LAB — HEPATIC FUNCTION PANEL
ALT: 9 U/L (ref 0–53)
AST: 14 U/L (ref 0–37)
Albumin: 3.8 g/dL (ref 3.5–5.2)
Alkaline Phosphatase: 42 U/L (ref 39–117)
Bilirubin, Direct: 0.1 mg/dL (ref 0.0–0.3)
Total Bilirubin: 0.3 mg/dL (ref 0.2–1.2)
Total Protein: 6.2 g/dL (ref 6.0–8.3)

## 2020-05-12 NOTE — Telephone Encounter (Signed)
Looks like pt just had a hepatic function test done on 04/23/2020.

## 2020-05-13 ENCOUNTER — Other Ambulatory Visit: Payer: Self-pay | Admitting: Internal Medicine

## 2020-05-13 DIAGNOSIS — Z79899 Other long term (current) drug therapy: Secondary | ICD-10-CM

## 2020-05-22 ENCOUNTER — Other Ambulatory Visit: Payer: No Typology Code available for payment source

## 2020-05-26 ENCOUNTER — Other Ambulatory Visit: Payer: Self-pay

## 2020-05-26 ENCOUNTER — Other Ambulatory Visit (INDEPENDENT_AMBULATORY_CARE_PROVIDER_SITE_OTHER): Payer: No Typology Code available for payment source

## 2020-05-26 DIAGNOSIS — Z79899 Other long term (current) drug therapy: Secondary | ICD-10-CM | POA: Diagnosis not present

## 2020-05-27 ENCOUNTER — Other Ambulatory Visit: Payer: Self-pay | Admitting: Internal Medicine

## 2020-05-27 DIAGNOSIS — Z79899 Other long term (current) drug therapy: Secondary | ICD-10-CM

## 2020-05-27 LAB — CBC WITH DIFFERENTIAL/PLATELET
Basophils Absolute: 0 10*3/uL (ref 0.0–0.1)
Basophils Relative: 0.4 % (ref 0.0–3.0)
Eosinophils Absolute: 0 10*3/uL (ref 0.0–0.7)
Eosinophils Relative: 0 % (ref 0.0–5.0)
HCT: 42.3 % (ref 39.0–52.0)
Hemoglobin: 14.4 g/dL (ref 13.0–17.0)
Lymphocytes Relative: 5 % — ABNORMAL LOW (ref 12.0–46.0)
Lymphs Abs: 0.5 10*3/uL — ABNORMAL LOW (ref 0.7–4.0)
MCHC: 33.9 g/dL (ref 30.0–36.0)
MCV: 91.5 fl (ref 78.0–100.0)
Monocytes Absolute: 0.2 10*3/uL (ref 0.1–1.0)
Monocytes Relative: 2.2 % — ABNORMAL LOW (ref 3.0–12.0)
Neutro Abs: 9.1 10*3/uL — ABNORMAL HIGH (ref 1.4–7.7)
Neutrophils Relative %: 92.4 % — ABNORMAL HIGH (ref 43.0–77.0)
Platelets: 318 10*3/uL (ref 150.0–400.0)
RBC: 4.62 Mil/uL (ref 4.22–5.81)
RDW: 14.6 % (ref 11.5–15.5)
WBC: 9.9 10*3/uL (ref 4.0–10.5)

## 2020-05-27 LAB — COMPREHENSIVE METABOLIC PANEL
ALT: 14 U/L (ref 0–53)
AST: 19 U/L (ref 0–37)
Albumin: 4.2 g/dL (ref 3.5–5.2)
Alkaline Phosphatase: 46 U/L (ref 39–117)
BUN: 16 mg/dL (ref 6–23)
CO2: 27 mEq/L (ref 19–32)
Calcium: 9.8 mg/dL (ref 8.4–10.5)
Chloride: 105 mEq/L (ref 96–112)
Creatinine, Ser: 0.98 mg/dL (ref 0.40–1.50)
GFR: 80.36 mL/min (ref >60.00)
Glucose, Bld: 113 mg/dL — ABNORMAL HIGH (ref 70–99)
Potassium: 4.7 mEq/L (ref 3.5–5.1)
Sodium: 140 mEq/L (ref 135–145)
Total Bilirubin: 0.3 mg/dL (ref 0.2–1.2)
Total Protein: 6.5 g/dL (ref 6.0–8.3)

## 2020-07-08 IMAGING — DX PORTABLE CHEST - 1 VIEW
1 series · 1 of 1 positions shown · non-contrast
Comparison: 05/31/2012 chest radiograph.

CLINICAL DATA: Chest pain

EXAM:
PORTABLE CHEST 1 VIEW

[chest ap]
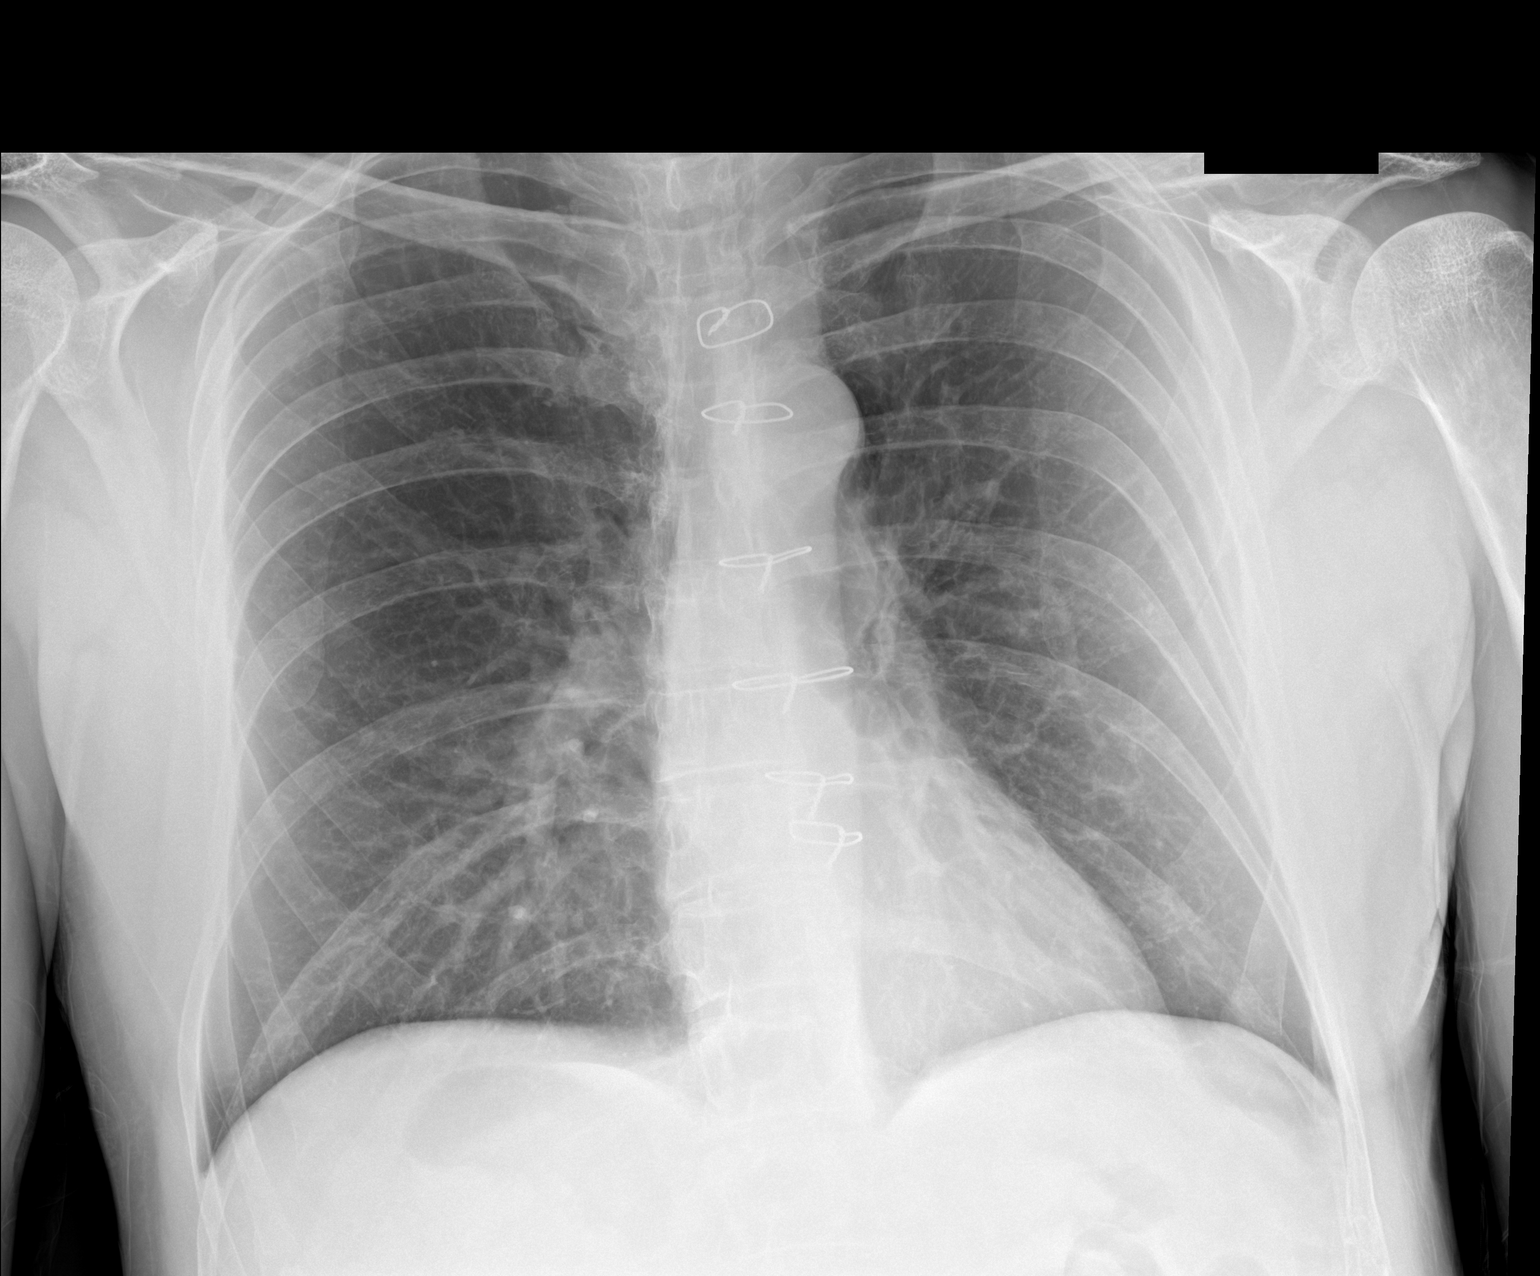

[1 of 1 positions shown; findings below may reference images not displayed]

FINDINGS: Intact sternotomy wires. Stable cardiomediastinal silhouette with
normal heart size. No pneumothorax. No pleural effusion. Lungs
appear clear, with no acute consolidative airspace disease and no
pulmonary edema.
IMPRESSION: No active disease.

## 2020-08-16 ENCOUNTER — Other Ambulatory Visit: Payer: Self-pay | Admitting: Cardiovascular Disease

## 2020-08-16 DIAGNOSIS — E785 Hyperlipidemia, unspecified: Secondary | ICD-10-CM

## 2020-09-02 ENCOUNTER — Other Ambulatory Visit (INDEPENDENT_AMBULATORY_CARE_PROVIDER_SITE_OTHER): Payer: No Typology Code available for payment source

## 2020-09-02 ENCOUNTER — Other Ambulatory Visit: Payer: Self-pay

## 2020-09-02 DIAGNOSIS — Z79899 Other long term (current) drug therapy: Secondary | ICD-10-CM | POA: Diagnosis not present

## 2020-09-03 DIAGNOSIS — D72810 Lymphocytopenia: Secondary | ICD-10-CM

## 2020-09-03 LAB — CBC WITH DIFFERENTIAL/PLATELET
Basophils Absolute: 0.1 10*3/uL (ref 0.0–0.1)
Basophils Relative: 1 % (ref 0.0–3.0)
Eosinophils Absolute: 0 10*3/uL (ref 0.0–0.7)
Eosinophils Relative: 0 % (ref 0.0–5.0)
HCT: 41.4 % (ref 39.0–52.0)
Hemoglobin: 14 g/dL (ref 13.0–17.0)
Lymphocytes Relative: 5 % — ABNORMAL LOW (ref 12.0–46.0)
Lymphs Abs: 0.4 10*3/uL — ABNORMAL LOW (ref 0.7–4.0)
MCHC: 33.9 g/dL (ref 30.0–36.0)
MCV: 91.5 fl (ref 78.0–100.0)
Monocytes Absolute: 0.2 10*3/uL (ref 0.1–1.0)
Monocytes Relative: 1.8 % — ABNORMAL LOW (ref 3.0–12.0)
Neutro Abs: 7.8 10*3/uL — ABNORMAL HIGH (ref 1.4–7.7)
Neutrophils Relative %: 92.2 % — ABNORMAL HIGH (ref 43.0–77.0)
Platelets: 316 10*3/uL (ref 150.0–400.0)
RBC: 4.53 Mil/uL (ref 4.22–5.81)
RDW: 14.4 % (ref 11.5–15.5)
WBC: 8.5 10*3/uL (ref 4.0–10.5)

## 2020-09-05 ENCOUNTER — Other Ambulatory Visit: Payer: Self-pay | Admitting: Internal Medicine

## 2020-09-10 ENCOUNTER — Other Ambulatory Visit: Payer: Self-pay | Admitting: Internal Medicine

## 2020-09-10 NOTE — Addendum Note (Signed)
Addended by: [name] on: 09/10/2020 01:39 PM   Modules accepted: Orders

## 2020-09-14 NOTE — Discharge Instructions (Signed)
Instructions after Total Hip Replacement     Frank Cabrera P. Ferlando Lia, Jr., M.D.     Dept. of Orthopaedics & Sports Medicine  [facility]  [address]  [place], [place]  [address]  Phone: [phone]   Fax: [phone]    DIET: . Drink plenty of non-alcoholic fluids. . Resume your normal diet. Include foods high in fiber.  ACTIVITY:  . You may use crutches or a walker with weight-bearing as tolerated, unless instructed otherwise. . You may be weaned off of the walker or crutches by your Physical Therapist.  . Do NOT reach below the level of your knees or cross your legs until allowed.    . Continue doing gentle exercises. Exercising will reduce the pain and swelling, increase motion, and prevent muscle weakness.   . Please continue to use the TED compression stockings for 6 weeks. You may remove the stockings at night, but should reapply them in the morning. . Do not drive or operate any equipment until instructed.  WOUND CARE:  . Continue to use ice packs periodically to reduce pain and swelling. . Keep the incision clean and dry. . You may bathe or shower after the staples are removed at the first office visit following surgery.  MEDICATIONS: . You may resume your regular medications. . Please take the pain medication as prescribed on the medication. . Do not take pain medication on an empty stomach. . You have been given a prescription for a blood thinner to prevent blood clots. Please take the medication as instructed. (NOTE: After completing a 2 week course of Lovenox, take one Enteric-coated aspirin once a day.) . Pain medications and iron supplements can cause constipation. Use a stool softener (Senokot or Colace) on a daily basis and a laxative (dulcolax or miralax) as needed. . Do not drive or drink alcoholic beverages when taking pain medications.  CALL THE OFFICE FOR: . Temperature above 101 degrees . Excessive bleeding or drainage on the dressing. . Excessive  swelling, coldness, or paleness of the toes. . Persistent nausea and vomiting.  FOLLOW-UP:  . You should have an appointment to return to the office in 6 weeks after surgery. . Arrangements have been made for continuation of Physical Therapy (either home therapy or outpatient therapy).     [facility] Department Directory         [email or web address]       [email or web address]          Cardiology  Appointments: [place] - [phone] [place] - [phone]  Endocrinology  Appointments: [place] - [phone] [place] - [phone]  Gastroenterology  Appointments: [place] - [phone] [place] - [phone]        General Surgery   Appointments: [place] - [phone]  Internal Medicine/Family Medicine  Appointments: [place] - [phone] [place] - [phone] [place] - [phone]  Metabolic and Weigh Loss Surgery  Appointments: [place] - [phone]        Neurology  Appointments: [place] - [phone] [place] - [phone]  Neurosurgery  Appointments: [place] - [phone]  Obstetrics & Gynecology  Appointments: [place] - [phone] [place] - [phone]        Pediatrics  Appointments: [place] - [phone] [place] - [phone]  Physiatry  Appointments: [place] -[phone]  Physical Therapy  Appointments: [place] - [phone] [place] - [phone]        Podiatry  Appointments: [place] - [phone] [place] - [phone]  Pulmonology  Appointments: [place] - [phone]  Rheumatology  Appointments: [place] - [phone]         Location: [facility]  [address] [place], [place]  [address]  [place] Location: [facility] [address] [place], [place]  [address]  [place] Location: [facility] [address] [place], [place]  [address]    

## 2020-09-18 ENCOUNTER — Other Ambulatory Visit: Payer: No Typology Code available for payment source

## 2020-09-18 ENCOUNTER — Encounter
Admission: RE | Admit: 2020-09-18 | Discharge: 2020-09-18 | Disposition: A | Discharge status: Home / Self Care | Payer: No Typology Code available for payment source | Source: Ambulatory Visit | Attending: Orthopedic Surgery | Admitting: Orthopedic Surgery

## 2020-09-18 ENCOUNTER — Other Ambulatory Visit: Payer: Self-pay

## 2020-09-18 DIAGNOSIS — Z01818 Encounter for other preprocedural examination: Secondary | ICD-10-CM | POA: Diagnosis not present

## 2020-09-18 HISTORY — DX: Myasthenia gravis without (acute) exacerbation: G70.00

## 2020-09-18 HISTORY — DX: Nausea with vomiting, unspecified: R11.2

## 2020-09-18 HISTORY — DX: Chronic pansinusitis: J32.4

## 2020-09-18 HISTORY — DX: Age-related osteoporosis without current pathological fracture: M81.0

## 2020-09-18 HISTORY — DX: Gastro-esophageal reflux disease without esophagitis: K21.9

## 2020-09-18 HISTORY — DX: Other specified postprocedural states: Z98.890

## 2020-09-18 LAB — COMPREHENSIVE METABOLIC PANEL
ALT: 15 U/L (ref 0–44)
AST: 19 U/L (ref 15–41)
Albumin: 3.7 g/dL (ref 3.5–5.0)
Alkaline Phosphatase: 36 U/L — ABNORMAL LOW (ref 38–126)
Anion gap: 7 (ref 5–15)
BUN: 17 mg/dL (ref 6–20)
CO2: 29 mmol/L (ref 22–32)
Calcium: 8.6 mg/dL — ABNORMAL LOW (ref 8.9–10.3)
Chloride: 104 mmol/L (ref 98–111)
Creatinine, Ser: 0.83 mg/dL (ref 0.61–1.24)
GFR, Estimated: >60 mL/min (ref >60)
Glucose, Bld: 80 mg/dL (ref 70–99)
Potassium: 3.5 mmol/L (ref 3.5–5.1)
Sodium: 140 mmol/L (ref 135–145)
Total Bilirubin: 0.7 mg/dL (ref 0.3–1.2)
Total Protein: 6.5 g/dL (ref 6.5–8.1)

## 2020-09-18 LAB — CBC
HCT: 40.5 % (ref 39.0–52.0)
Hemoglobin: 13.8 g/dL (ref 13.0–17.0)
MCH: 31.4 pg (ref 26.0–34.0)
MCHC: 34.1 g/dL (ref 30.0–36.0)
MCV: 92 fL (ref 80.0–100.0)
Platelets: 257 10*3/uL (ref 150–400)
RBC: 4.4 MIL/uL (ref 4.22–5.81)
RDW: 13.3 % (ref 11.5–15.5)
WBC: 6.9 10*3/uL (ref 4.0–10.5)
nRBC: 0 % (ref 0.0–0.2)

## 2020-09-18 LAB — URINALYSIS, ROUTINE W REFLEX MICROSCOPIC
Bilirubin Urine: NEGATIVE
Glucose, UA: NEGATIVE mg/dL
Hgb urine dipstick: NEGATIVE
Ketones, ur: NEGATIVE mg/dL
Leukocytes,Ua: NEGATIVE
Nitrite: NEGATIVE
Protein, ur: NEGATIVE mg/dL
Specific Gravity, Urine: 1.012 (ref 1.005–1.030)
pH: 6 (ref 5.0–8.0)

## 2020-09-18 LAB — SEDIMENTATION RATE: Sed Rate: 8 mm/hr (ref 0–20)

## 2020-09-18 LAB — C-REACTIVE PROTEIN: CRP: 0.6 mg/dL (ref <1.0)

## 2020-09-18 LAB — TYPE AND SCREEN
ABO/RH(D): O POS
Antibody Screen: NEGATIVE

## 2020-09-18 LAB — PROTIME-INR
INR: 0.9 (ref 0.8–1.2)
Prothrombin Time: 12 seconds (ref 11.4–15.2)

## 2020-09-18 LAB — APTT: aPTT: 29 seconds (ref 24–36)

## 2020-09-18 LAB — SURGICAL PCR SCREEN
MRSA, PCR: NEGATIVE
Staphylococcus aureus: POSITIVE — AB

## 2020-09-18 NOTE — Patient Instructions (Addendum)
Your procedure is scheduled on: 09/24/20 Report to [facility] AFTER YOU CHECK IN AT [facility]. To find out your arrival time please call [phone] between 1PM - 3PM on 09/23/20. The day surgery secretary will call you.  Remember: Instructions that are not followed completely may result in serious medical risk, up to and including death, or upon the discretion of your surgeon and anesthesiologist your surgery may need to be rescheduled.     _X__ 1. Do not eat food after midnight the night before your procedure.                 No gum chewing or hard candies. You may drink clear liquids up to 2 hours                 before you are scheduled to arrive for your surgery- DO not drink clear                 liquids within 2 hours of the start of your surgery.                 Clear Liquids include:  water, apple juice without pulp, clear carbohydrate                 drink such as Clearfast or Gatorade, Black Coffee or Tea (Do not add                 anything to coffee or tea). Diabetics water only  __X__2.  On the morning of surgery brush your teeth with toothpaste and water, you                 may rinse your mouth with mouthwash if you wish.  Do not swallow any              toothpaste of mouthwash.     _X__ 3.  No Alcohol for 24 hours before or after surgery.   _X__ 4.  Do Not Smoke or use e-cigarettes For 24 Hours Prior to Your Surgery.                 Do not use any chewable tobacco products for at least 6 hours prior to                 surgery.  ____  5.  Bring all medications with you on the day of surgery if instructed.   __X__  6.  Notify your doctor if there is any change in your medical condition      (cold, fever, infections).     Do not wear jewelry, make-up, hairpins, clips or nail polish. Do not wear lotions, powders, or perfumes.  Do not shave 48 hours prior to surgery. Men may shave face and neck. Do not bring  valuables to the hospital.    [facility] is not responsible for any belongings or valuables.  Contacts, dentures/partials or body piercings may not be worn into surgery. Bring a case for your contacts, glasses or hearing aids, a denture cup will be supplied. Leave your suitcase in the car. After surgery it may be brought to your room. For patients admitted to the hospital, discharge time is determined by your treatment team.   Patients discharged the day of surgery will not be allowed to drive home.   Please read over the following fact sheets that you were given:   MRSA Information  __X__ Take these medicines the morning of surgery with A SIP OF WATER:    1. omeprazole (PRILOSEC) 40 MG capsule  2. azaTHIOprine (IMURAN) 150 MG tablet  3. predniSONE (DELTASONE) 10 MG tablet  4. rosuvastatin (CRESTOR) 5 MG tablet  5.  6.  ____ Fleet Enema (as directed)   __X__ Use CHG Soap/SAGE wipes as directed  ____ Use inhalers on the day of surgery  ____ Stop metformin/Janumet/Farxiga 2 days prior to surgery    ____ Take 1/2 of usual insulin dose the night before surgery. No insulin the morning          of surgery.   ____ Stop Blood Thinners Coumadin/Plavix/Xarelto/Pleta/Pradaxa/Eliquis/Effient/Aspirin  on   Or contact your Surgeon, Cardiologist or Medical Doctor regarding  ability to stop your blood thinners  __X__ Stop Anti-inflammatories 7 days before surgery such as Advil, Ibuprofen, Motrin,  BC or Goodies Powder, Naprosyn, Naproxen, Aleve, Aspirin    __X__ Stop all herbal supplements, fish oil or vitamin E until after surgery. STOP FLAXSEED OIL TODAY 09/18/20   ____ Bring C-Pap to the hospital.

## 2020-09-19 LAB — URINE CULTURE
Culture: <10000 — AB
Special Requests: NORMAL

## 2020-09-22 ENCOUNTER — Other Ambulatory Visit: Payer: Self-pay

## 2020-09-22 ENCOUNTER — Other Ambulatory Visit
Admission: RE | Admit: 2020-09-22 | Discharge: 2020-09-22 | Disposition: A | Discharge status: Home / Self Care | Payer: No Typology Code available for payment source | Source: Ambulatory Visit | Attending: Orthopedic Surgery | Admitting: Orthopedic Surgery

## 2020-09-22 DIAGNOSIS — Z20822 Contact with and (suspected) exposure to covid-19: Secondary | ICD-10-CM | POA: Insufficient documentation

## 2020-09-22 DIAGNOSIS — Z01812 Encounter for preprocedural laboratory examination: Secondary | ICD-10-CM | POA: Insufficient documentation

## 2020-09-22 LAB — SARS CORONAVIRUS 2 (TAT 6-24 HRS): SARS Coronavirus 2: NEGATIVE

## 2020-09-24 ENCOUNTER — Other Ambulatory Visit: Payer: Self-pay

## 2020-09-24 ENCOUNTER — Inpatient Hospital Stay
Admission: RE | Admit: 2020-09-24 | Discharge: 2020-09-24 | DRG: 470 | Disposition: A | Discharge status: Home Health | Payer: No Typology Code available for payment source | Attending: Orthopedic Surgery | Admitting: Orthopedic Surgery

## 2020-09-24 ENCOUNTER — Inpatient Hospital Stay: Payer: No Typology Code available for payment source | Admitting: Certified Registered"

## 2020-09-24 ENCOUNTER — Encounter
Admission: RE | Disposition: A | Discharge status: Home Health | Payer: Self-pay | Source: Home / Self Care | Attending: Orthopedic Surgery

## 2020-09-24 ENCOUNTER — Inpatient Hospital Stay: Payer: No Typology Code available for payment source

## 2020-09-24 ENCOUNTER — Encounter: Payer: Self-pay | Admitting: Orthopedic Surgery

## 2020-09-24 DIAGNOSIS — G2581 Restless legs syndrome: Secondary | ICD-10-CM | POA: Diagnosis present

## 2020-09-24 DIAGNOSIS — Z79899 Other long term (current) drug therapy: Secondary | ICD-10-CM

## 2020-09-24 DIAGNOSIS — G7000 Myasthenia gravis without (acute) exacerbation: Secondary | ICD-10-CM | POA: Diagnosis present

## 2020-09-24 DIAGNOSIS — M1611 Unilateral primary osteoarthritis, right hip: Principal | ICD-10-CM | POA: Diagnosis present

## 2020-09-24 DIAGNOSIS — Z7982 Long term (current) use of aspirin: Secondary | ICD-10-CM

## 2020-09-24 DIAGNOSIS — Z83511 Family history of glaucoma: Secondary | ICD-10-CM

## 2020-09-24 DIAGNOSIS — Z8249 Family history of ischemic heart disease and other diseases of the circulatory system: Secondary | ICD-10-CM

## 2020-09-24 DIAGNOSIS — M810 Age-related osteoporosis without current pathological fracture: Secondary | ICD-10-CM | POA: Diagnosis present

## 2020-09-24 DIAGNOSIS — Z96649 Presence of unspecified artificial hip joint: Secondary | ICD-10-CM

## 2020-09-24 DIAGNOSIS — Z96641 Presence of right artificial hip joint: Secondary | ICD-10-CM

## 2020-09-24 DIAGNOSIS — Z20822 Contact with and (suspected) exposure to covid-19: Secondary | ICD-10-CM | POA: Diagnosis present

## 2020-09-24 DIAGNOSIS — K219 Gastro-esophageal reflux disease without esophagitis: Secondary | ICD-10-CM | POA: Diagnosis present

## 2020-09-24 LAB — ABO/RH: ABO/RH(D): O POS

## 2020-09-24 SURGERY — ARTHROPLASTY, HIP, TOTAL,POSTERIOR APPROACH
Anesthesia: Spinal | Site: Hip | Laterality: Right

## 2020-09-24 MED ORDER — DEXAMETHASONE SODIUM PHOSPHATE 10 MG/ML IJ SOLN: 8.0000 mg | Freq: Once | INTRAMUSCULAR | Status: AC

## 2020-09-24 MED ORDER — BUPIVACAINE HCL (PF) 0.5 % IJ SOLN
INTRAMUSCULAR | Status: AC
  Filled 2020-09-24: qty 10

## 2020-09-24 MED ORDER — PANTOPRAZOLE SODIUM 40 MG PO TBEC: 40.0000 mg | DELAYED_RELEASE_TABLET | Freq: Two times a day (BID) | ORAL | Status: DC

## 2020-09-24 MED ORDER — DIPHENHYDRAMINE HCL 50 MG/ML IJ SOLN
INTRAMUSCULAR | Status: DC | PRN
  Administered 2020-09-24: 12.5 mg via INTRAVENOUS

## 2020-09-24 MED ORDER — SENNOSIDES-DOCUSATE SODIUM 8.6-50 MG PO TABS: 1.0000 | ORAL_TABLET | Freq: Two times a day (BID) | ORAL | Status: DC

## 2020-09-24 MED ORDER — CELECOXIB 200 MG PO CAPS: 200.0000 mg | ORAL_CAPSULE | Freq: Two times a day (BID) | ORAL | Status: DC

## 2020-09-24 MED ORDER — ONDANSETRON HCL 4 MG/2ML IJ SOLN
INTRAMUSCULAR | Status: DC | PRN
  Administered 2020-09-24: 4 mg via INTRAVENOUS

## 2020-09-24 MED ORDER — TRANEXAMIC ACID-NACL 1000-0.7 MG/100ML-% IV SOLN
INTRAVENOUS | Status: AC
  Filled 2020-09-24: qty 100

## 2020-09-24 MED ORDER — TRANEXAMIC ACID-NACL 1000-0.7 MG/100ML-% IV SOLN
1000.0000 mg | Freq: Once | INTRAVENOUS | Status: AC
  Administered 2020-09-24: 1000 mg via INTRAVENOUS

## 2020-09-24 MED ORDER — ONDANSETRON HCL 4 MG PO TABS
ORAL_TABLET | ORAL | Status: AC
  Filled 2020-09-24: qty 1

## 2020-09-24 MED ORDER — PHENYLEPHRINE HCL (PRESSORS) 10 MG/ML IV SOLN
INTRAVENOUS | Status: DC | PRN
  Administered 2020-09-24: 25 ug via INTRAVENOUS

## 2020-09-24 MED ORDER — TRAMADOL HCL 50 MG PO TABS
50.0000 mg | ORAL_TABLET | ORAL | 0 refills | Status: DC | PRN
Start: 2020-09-24 — End: 2020-10-16

## 2020-09-24 MED ORDER — TRANEXAMIC ACID-NACL 1000-0.7 MG/100ML-% IV SOLN: 1000.0000 mg | INTRAVENOUS | Status: DC

## 2020-09-24 MED ORDER — CHLORHEXIDINE GLUCONATE 0.12 % MT SOLN
OROMUCOSAL | Status: AC
  Administered 2020-09-24: 15 mL via OROMUCOSAL
  Filled 2020-09-24: qty 15

## 2020-09-24 MED ORDER — CALCIUM CARBONATE 1250 (500 CA) MG PO TABS
1250.0000 mg | ORAL_TABLET | Freq: Every day | ORAL | Status: DC
  Filled 2020-09-24: qty 1

## 2020-09-24 MED ORDER — ALUM & MAG HYDROXIDE-SIMETH 200-200-20 MG/5ML PO SUSP: 30.0000 mL | ORAL | Status: DC | PRN

## 2020-09-24 MED ORDER — DIPHENHYDRAMINE HCL 50 MG/ML IJ SOLN
INTRAMUSCULAR | Status: AC
  Filled 2020-09-24: qty 1

## 2020-09-24 MED ORDER — ACETAMINOPHEN 10 MG/ML IV SOLN
INTRAVENOUS | Status: DC | PRN
  Administered 2020-09-24: 1000 mg via INTRAVENOUS

## 2020-09-24 MED ORDER — FENTANYL CITRATE (PF) 100 MCG/2ML IJ SOLN
INTRAMUSCULAR | Status: DC | PRN
  Administered 2020-09-24 (×3): 25 ug via INTRAVENOUS
  Administered 2020-09-24: 50 ug via INTRAVENOUS
  Administered 2020-09-24: 25 ug via INTRAVENOUS
  Administered 2020-09-24: 50 ug via INTRAVENOUS

## 2020-09-24 MED ORDER — OXYCODONE HCL 5 MG PO TABS: 5.0000 mg | ORAL_TABLET | ORAL | 0 refills | Status: DC | PRN

## 2020-09-24 MED ORDER — GABAPENTIN 300 MG PO CAPS
ORAL_CAPSULE | ORAL | Status: AC
  Administered 2020-09-24: 300 mg via ORAL
  Filled 2020-09-24: qty 1

## 2020-09-24 MED ORDER — NEOMYCIN-POLYMYXIN B GU 40-200000 IR SOLN
Status: DC | PRN
  Administered 2020-09-24: 2 mL

## 2020-09-24 MED ORDER — GABAPENTIN 300 MG PO CAPS: 300.0000 mg | ORAL_CAPSULE | Freq: Once | ORAL | Status: AC

## 2020-09-24 MED ORDER — ROSUVASTATIN CALCIUM 5 MG PO TABS: 5.0000 mg | ORAL_TABLET | Freq: Every day | ORAL | Status: DC

## 2020-09-24 MED ORDER — FENTANYL CITRATE (PF) 100 MCG/2ML IJ SOLN
INTRAMUSCULAR | Status: AC
  Filled 2020-09-24: qty 2

## 2020-09-24 MED ORDER — ACETAMINOPHEN 325 MG PO TABS: 325.0000 mg | ORAL_TABLET | Freq: Four times a day (QID) | ORAL | Status: DC | PRN

## 2020-09-24 MED ORDER — HYDROMORPHONE HCL 1 MG/ML IJ SOLN: 0.5000 mg | INTRAMUSCULAR | Status: DC | PRN

## 2020-09-24 MED ORDER — CHLORHEXIDINE GLUCONATE 4 % EX LIQD: 60.0000 mL | Freq: Once | CUTANEOUS | Status: DC

## 2020-09-24 MED ORDER — CELECOXIB 200 MG PO CAPS: 200.0000 mg | ORAL_CAPSULE | Freq: Two times a day (BID) | ORAL | 0 refills | Status: DC

## 2020-09-24 MED ORDER — PROPOFOL 500 MG/50ML IV EMUL
INTRAVENOUS | Status: DC | PRN
  Administered 2020-09-24: 80 ug/kg/min via INTRAVENOUS

## 2020-09-24 MED ORDER — OXYCODONE HCL 5 MG PO TABS
ORAL_TABLET | ORAL | Status: AC
  Filled 2020-09-24: qty 1

## 2020-09-24 MED ORDER — FERROUS SULFATE 325 (65 FE) MG PO TABS: 325.0000 mg | ORAL_TABLET | Freq: Two times a day (BID) | ORAL | Status: DC

## 2020-09-24 MED ORDER — CEFAZOLIN SODIUM-DEXTROSE 2-4 GM/100ML-% IV SOLN
INTRAVENOUS | Status: AC
  Filled 2020-09-24: qty 100

## 2020-09-24 MED ORDER — BUPIVACAINE HCL (PF) 0.5 % IJ SOLN
INTRAMUSCULAR | Status: DC | PRN
  Administered 2020-09-24: 3 mL

## 2020-09-24 MED ORDER — DIPHENHYDRAMINE HCL 12.5 MG/5ML PO ELIX
12.5000 mg | ORAL_SOLUTION | ORAL | Status: DC | PRN
Start: 2020-09-24 — End: 2020-09-25
  Filled 2020-09-24: qty 10

## 2020-09-24 MED ORDER — EPHEDRINE SULFATE 50 MG/ML IJ SOLN
INTRAMUSCULAR | Status: AC
  Filled 2020-09-24: qty 1

## 2020-09-24 MED ORDER — PREDNISONE 10 MG PO TABS: 25.0000 mg | ORAL_TABLET | ORAL | Status: DC

## 2020-09-24 MED ORDER — LACTATED RINGERS IV SOLN: INTRAVENOUS | Status: DC

## 2020-09-24 MED ORDER — PROPOFOL 10 MG/ML IV BOLUS
INTRAVENOUS | Status: AC
  Filled 2020-09-24: qty 20

## 2020-09-24 MED ORDER — EPHEDRINE SULFATE 50 MG/ML IJ SOLN
INTRAMUSCULAR | Status: DC | PRN
  Administered 2020-09-24 (×2): 10 mg via INTRAVENOUS
  Administered 2020-09-24: 5 mg via INTRAVENOUS
  Administered 2020-09-24: 10 mg via INTRAVENOUS
  Administered 2020-09-24: 5 mg via INTRAVENOUS

## 2020-09-24 MED ORDER — OXYCODONE HCL 5 MG PO TABS: 5.0000 mg | ORAL_TABLET | ORAL | Status: DC | PRN

## 2020-09-24 MED ORDER — LACTATED RINGERS IV SOLN: Freq: Once | INTRAVENOUS | Status: AC

## 2020-09-24 MED ORDER — PROPOFOL 500 MG/50ML IV EMUL
INTRAVENOUS | Status: AC
  Filled 2020-09-24: qty 50

## 2020-09-24 MED ORDER — ONDANSETRON HCL 4 MG/2ML IJ SOLN: 4.0000 mg | Freq: Four times a day (QID) | INTRAMUSCULAR | Status: DC | PRN

## 2020-09-24 MED ORDER — MENTHOL 3 MG MT LOZG
1.0000 | LOZENGE | OROMUCOSAL | Status: DC | PRN
  Filled 2020-09-24: qty 9

## 2020-09-24 MED ORDER — OXYCODONE HCL 5 MG PO TABS: 5.0000 mg | ORAL_TABLET | Freq: Once | ORAL | Status: DC | PRN

## 2020-09-24 MED ORDER — CEFAZOLIN SODIUM-DEXTROSE 2-4 GM/100ML-% IV SOLN
2.0000 g | INTRAVENOUS | Status: AC
  Administered 2020-09-24: 2 g via INTRAVENOUS

## 2020-09-24 MED ORDER — PHENOL 1.4 % MT LIQD
1.0000 | OROMUCOSAL | Status: DC | PRN
  Filled 2020-09-24: qty 177

## 2020-09-24 MED ORDER — GLYCOPYRROLATE 0.2 MG/ML IJ SOLN
INTRAMUSCULAR | Status: AC
  Filled 2020-09-24: qty 1

## 2020-09-24 MED ORDER — ENOXAPARIN SODIUM 30 MG/0.3ML ~~LOC~~ SOLN
30.0000 mg | Freq: Two times a day (BID) | SUBCUTANEOUS | Status: DC
  Filled 2020-09-24: qty 0.3

## 2020-09-24 MED ORDER — ACETAMINOPHEN 10 MG/ML IV SOLN: 1000.0000 mg | Freq: Four times a day (QID) | INTRAVENOUS | Status: DC

## 2020-09-24 MED ORDER — MIDAZOLAM HCL 2 MG/2ML IJ SOLN
INTRAMUSCULAR | Status: AC
  Filled 2020-09-24: qty 2

## 2020-09-24 MED ORDER — FLEET ENEMA 7-19 GM/118ML RE ENEM: 1.0000 | ENEMA | Freq: Once | RECTAL | Status: DC | PRN

## 2020-09-24 MED ORDER — CEFAZOLIN SODIUM-DEXTROSE 2-4 GM/100ML-% IV SOLN
INTRAVENOUS | Status: AC
  Administered 2020-09-24: 2 g via INTRAVENOUS
  Filled 2020-09-24: qty 100

## 2020-09-24 MED ORDER — MAGNESIUM HYDROXIDE 400 MG/5ML PO SUSP: 30.0000 mL | Freq: Every day | ORAL | Status: DC

## 2020-09-24 MED ORDER — ORAL CARE MOUTH RINSE: 15.0000 mL | Freq: Once | OROMUCOSAL | Status: AC

## 2020-09-24 MED ORDER — ONDANSETRON HCL 4 MG/2ML IJ SOLN: 4.0000 mg | Freq: Once | INTRAMUSCULAR | Status: DC | PRN

## 2020-09-24 MED ORDER — GABAPENTIN 300 MG PO CAPS: 300.0000 mg | ORAL_CAPSULE | Freq: Every day | ORAL | Status: DC

## 2020-09-24 MED ORDER — SODIUM CHLORIDE 0.9 % IV SOLN: INTRAVENOUS | Status: DC

## 2020-09-24 MED ORDER — DEXAMETHASONE SODIUM PHOSPHATE 10 MG/ML IJ SOLN
INTRAMUSCULAR | Status: AC
  Administered 2020-09-24: 8 mg via INTRAVENOUS
  Filled 2020-09-24: qty 1

## 2020-09-24 MED ORDER — ONDANSETRON HCL 4 MG PO TABS
4.0000 mg | ORAL_TABLET | Freq: Four times a day (QID) | ORAL | Status: DC | PRN
  Administered 2020-09-24: 4 mg via ORAL

## 2020-09-24 MED ORDER — CHLORHEXIDINE GLUCONATE 0.12 % MT SOLN: 15.0000 mL | Freq: Once | OROMUCOSAL | Status: AC

## 2020-09-24 MED ORDER — PRAMIPEXOLE DIHYDROCHLORIDE 1 MG PO TABS
1.0000 mg | ORAL_TABLET | Freq: Every day | ORAL | Status: DC
Start: 2020-09-24 — End: 2020-09-25

## 2020-09-24 MED ORDER — OXYCODONE HCL 5 MG/5ML PO SOLN: 5.0000 mg | Freq: Once | ORAL | Status: DC | PRN

## 2020-09-24 MED ORDER — ENOXAPARIN SODIUM 40 MG/0.4ML ~~LOC~~ SOLN: 40.0000 mg | SUBCUTANEOUS | 0 refills | Status: DC

## 2020-09-24 MED ORDER — TRAMADOL HCL 50 MG PO TABS
50.0000 mg | ORAL_TABLET | ORAL | Status: DC | PRN
  Administered 2020-09-24: 100 mg via ORAL

## 2020-09-24 MED ORDER — FENTANYL CITRATE (PF) 100 MCG/2ML IJ SOLN: 25.0000 ug | INTRAMUSCULAR | Status: DC | PRN

## 2020-09-24 MED ORDER — FOLIC ACID 1 MG PO TABS: 1.0000 mg | ORAL_TABLET | Freq: Every day | ORAL | Status: DC

## 2020-09-24 MED ORDER — ONDANSETRON HCL 4 MG/2ML IJ SOLN
INTRAMUSCULAR | Status: AC
  Filled 2020-09-24: qty 2

## 2020-09-24 MED ORDER — SURGIRINSE WOUND IRRIGATION SYSTEM - OPTIME
TOPICAL | Status: DC | PRN
  Administered 2020-09-24: 900 mL via TOPICAL

## 2020-09-24 MED ORDER — GLYCOPYRROLATE 0.2 MG/ML IJ SOLN
INTRAMUSCULAR | Status: DC | PRN
  Administered 2020-09-24: .2 mg via INTRAVENOUS

## 2020-09-24 MED ORDER — ENSURE PRE-SURGERY PO LIQD
296.0000 mL | Freq: Once | ORAL | Status: DC
  Filled 2020-09-24: qty 296

## 2020-09-24 MED ORDER — TRANEXAMIC ACID-NACL 1000-0.7 MG/100ML-% IV SOLN
INTRAVENOUS | Status: DC | PRN
  Administered 2020-09-24: 1000 mg via INTRAVENOUS

## 2020-09-24 MED ORDER — ACETAMINOPHEN 10 MG/ML IV SOLN
INTRAVENOUS | Status: AC
  Filled 2020-09-24: qty 100

## 2020-09-24 MED ORDER — ENOXAPARIN SODIUM 40 MG/0.4ML ~~LOC~~ SOLN
SUBCUTANEOUS | Status: AC
  Filled 2020-09-24: qty 0.4

## 2020-09-24 MED ORDER — CELECOXIB 200 MG PO CAPS
ORAL_CAPSULE | ORAL | Status: AC
  Administered 2020-09-24: 400 mg via ORAL
  Filled 2020-09-24: qty 2

## 2020-09-24 MED ORDER — CEFAZOLIN SODIUM-DEXTROSE 2-4 GM/100ML-% IV SOLN: 2.0000 g | Freq: Four times a day (QID) | INTRAVENOUS | Status: DC

## 2020-09-24 MED ORDER — AZATHIOPRINE 50 MG PO TABS
150.0000 mg | ORAL_TABLET | Freq: Every day | ORAL | Status: DC
  Filled 2020-09-24: qty 3

## 2020-09-24 MED ORDER — MIDAZOLAM HCL 5 MG/5ML IJ SOLN
INTRAMUSCULAR | Status: DC | PRN
  Administered 2020-09-24: 2 mg via INTRAVENOUS

## 2020-09-24 MED ORDER — TRAMADOL HCL 50 MG PO TABS
ORAL_TABLET | ORAL | Status: AC
  Filled 2020-09-24: qty 2

## 2020-09-24 MED ORDER — CELECOXIB 200 MG PO CAPS: 400.0000 mg | ORAL_CAPSULE | Freq: Once | ORAL | Status: AC

## 2020-09-24 MED ORDER — BISACODYL 10 MG RE SUPP
10.0000 mg | Freq: Every day | RECTAL | Status: DC | PRN
  Filled 2020-09-24: qty 1

## 2020-09-24 SURGICAL SUPPLY — 63 items
AML 16.5 STD 6.3 5/8 STD 12/1 (Hips) ×2 IMPLANT
BLADE DRUM FLTD (BLADE) ×2 IMPLANT
BLADE SAW 90X25X1.19 OSCILLAT (BLADE) ×2 IMPLANT
CANISTER SUCT 1200ML W/VALVE (MISCELLANEOUS) ×2 IMPLANT
CANISTER SUCT 3000ML PPV (MISCELLANEOUS) ×4 IMPLANT
CARTRIDGE OIL MAESTRO DRILL (MISCELLANEOUS) ×1 IMPLANT
COVER WAND RF STERILE (DRAPES) ×2 IMPLANT
CUP ACETBLR 54 OD 100 SERIES (Hips) ×2 IMPLANT
DIFFUSER DRILL AIR PNEUMATIC (MISCELLANEOUS) ×2 IMPLANT
DRAPE 3/4 80X56 (DRAPES) ×2 IMPLANT
DRAPE INCISE IOBAN 66X60 STRL (DRAPES) ×2 IMPLANT
DRSG DERMACEA 8X12 NADH (GAUZE/BANDAGES/DRESSINGS) ×2 IMPLANT
DRSG MEPILEX SACRM 8.7X9.8 (GAUZE/BANDAGES/DRESSINGS) ×2 IMPLANT
DRSG OPSITE POSTOP 4X12 (GAUZE/BANDAGES/DRESSINGS) ×2 IMPLANT
DRSG OPSITE POSTOP 4X14 (GAUZE/BANDAGES/DRESSINGS) IMPLANT
DRSG TEGADERM 4X4.75 (GAUZE/BANDAGES/DRESSINGS) ×2 IMPLANT
DURAPREP 26ML APPLICATOR (WOUND CARE) ×2 IMPLANT
ELECT REM PT RETURN 9FT ADLT (ELECTROSURGICAL) ×2
ELECTRODE REM PT RTRN 9FT ADLT (ELECTROSURGICAL) ×1 IMPLANT
GLOVE BIO SURGEON STRL SZ7.5 (GLOVE) ×4 IMPLANT
GLOVE BIOGEL M STRL SZ7.5 (GLOVE) ×4 IMPLANT
GLOVE BIOGEL PI IND STRL 7.5 (GLOVE) ×1 IMPLANT
GLOVE BIOGEL PI INDICATOR 7.5 (GLOVE) ×1
GLOVE INDICATOR 8.0 STRL GRN (GLOVE) ×2 IMPLANT
GOWN STRL REUS W/ TWL LRG LVL3 (GOWN DISPOSABLE) ×2 IMPLANT
GOWN STRL REUS W/ TWL XL LVL3 (GOWN DISPOSABLE) ×1 IMPLANT
GOWN STRL REUS W/TWL LRG LVL3 (GOWN DISPOSABLE) ×4
GOWN STRL REUS W/TWL XL LVL3 (GOWN DISPOSABLE) ×2
HEMOVAC 400CC 10FR (MISCELLANEOUS) ×2 IMPLANT
HIP AML 16.5 STD 6.3 5/8STD2/1 (Hips) IMPLANT
HOLDER FOLEY CATH W/STRAP (MISCELLANEOUS) ×2 IMPLANT
HOOD PEEL AWAY FLYTE STAYCOOL (MISCELLANEOUS) ×4 IMPLANT
IRRIGATION SURGIPHOR STRL (IV SOLUTION) ×2 IMPLANT
KIT PEG BOARD PINK (KITS) ×2 IMPLANT
KIT TURNOVER KIT A (KITS) ×2 IMPLANT
LINER NEUTRAL 36ID 54OD (Liner) ×1 IMPLANT
MANIFOLD NEPTUNE II (INSTRUMENTS) ×4 IMPLANT
NDL SAFETY ECLIPSE 18X1.5 (NEEDLE) ×1 IMPLANT
NEEDLE HYPO 18GX1.5 SHARP (NEEDLE) ×2
NS IRRIG 500ML POUR BTL (IV SOLUTION) ×2 IMPLANT
OIL CARTRIDGE MAESTRO DRILL (MISCELLANEOUS) ×2
PACK HIP PROSTHESIS (MISCELLANEOUS) ×2 IMPLANT
PENCIL SMOKE ULTRAEVAC 22 CON (MISCELLANEOUS) ×2 IMPLANT
PULSAVAC PLUS IRRIG FAN TIP (DISPOSABLE) ×2
SOL .9 NS 3000ML IRR  AL (IV SOLUTION) ×2
SOL .9 NS 3000ML IRR AL (IV SOLUTION) ×1
SOL .9 NS 3000ML IRR UROMATIC (IV SOLUTION) ×1 IMPLANT
SOL PREP PVP 2OZ (MISCELLANEOUS) ×2
SOLUTION PREP PVP 2OZ (MISCELLANEOUS) ×1 IMPLANT
SPONGE DRAIN TRACH 4X4 STRL 2S (GAUZE/BANDAGES/DRESSINGS) ×2 IMPLANT
STAPLER SKIN PROX 35W (STAPLE) ×2 IMPLANT
SUT ETHIBOND #5 BRAIDED 30INL (SUTURE) ×2 IMPLANT
SUT VIC AB 0 CT1 36 (SUTURE) ×2 IMPLANT
SUT VIC AB 1 CT1 36 (SUTURE) ×4 IMPLANT
SUT VIC AB 2-0 CT1 27 (SUTURE) ×2
SUT VIC AB 2-0 CT1 TAPERPNT 27 (SUTURE) ×1 IMPLANT
SYR 20ML LL LF (SYRINGE) ×2 IMPLANT
TAPE CLOTH 3X10 WHT NS LF (GAUZE/BANDAGES/DRESSINGS) ×2 IMPLANT
TAPE TRANSPORE STRL 2 31045 (GAUZE/BANDAGES/DRESSINGS) ×2 IMPLANT
TIP FAN IRRIG PULSAVAC PLUS (DISPOSABLE) ×1 IMPLANT
TOWEL OR 17X26 4PK STRL BLUE (TOWEL DISPOSABLE) ×2 IMPLANT
TRAY FOLEY MTR SLVR 16FR STAT (SET/KITS/TRAYS/PACK) ×2 IMPLANT
TUBE KAMVAC SUCTION (TUBING) ×1 IMPLANT

## 2020-09-24 NOTE — H&P (Signed)
The patient has been re-examined, and the chart reviewed, and there have been no interval changes to the documented history and physical.    The risks, benefits, and alternatives have been discussed at length. The patient expressed understanding of the risks benefits and agreed with plans for surgical intervention.  Frank Cabrera, Jr. M.D.    

## 2020-09-24 NOTE — Anesthesia Procedure Notes (Signed)
Spinal  Patient location during procedure: OR Start time: 09/24/2020 7:25 AM Staffing Performed: resident/CRNA  Preanesthetic Checklist Completed: patient identified, IV checked, site marked, risks and benefits discussed, surgical consent, monitors and equipment checked, pre-op evaluation and timeout performed Spinal Block Patient position: sitting Prep: DuraPrep Patient monitoring: heart rate, cardiac monitor, continuous pulse ox and blood pressure Approach: midline Location: L3-4 Injection technique: single-shot Needle Needle type: Sprotte  Needle gauge: 24 G Needle length: 9 cm Assessment Sensory level: T4 Additional Notes Negative heme, negative paresthesia, no pain with injection, good free flow CSF pre/post injection.

## 2020-09-24 NOTE — TOC Transition Note (Signed)
Transition of Care [facility]) - CM/SW Discharge Note   Patient Details  Name: Frank Cabrera MRN: [id number] Date of Birth: 23-Jul-1966  [facility]) CM/SW Contact:  Meriel Flavors, LCSW Phone Number: 09/24/2020, 3:54 PM   Clinical Narrative:     CSW completed referral for DME with [employer], will deliver to room.        Patient Goals and CMS Choice        Discharge Placement                       Discharge Plan and Services                                     Social Determinants of Health (SDOH) Interventions     Readmission Risk Interventions No flowsheet data found.

## 2020-09-24 NOTE — Evaluation (Signed)
Physical Therapy Evaluation Patient Details Name: Frank Cabrera MRN: [id number] DOB: June 27, 1966 Today's Date: 09/24/2020   History of Present Illness  Frank Cabrera is a 54 y.o. male who presents with degenerative arthrosis of the R hip. He is now s/p R THA. PMH includes HSV II, Myasthenia gravis, osteoporosis.  Clinical Impression  Pt is a pleasant 54 year old male who was admitted for R THR. Pt performs bed mobility, transfers, and ambulation with cga and RW. Pt demonstrates deficits with strength/mobility/pain. All needs met at this time and pt hopeful to dc home. Would benefit from skilled PT to address above deficits and promote optimal return to PLOF. Recommend transition to [facility] upon discharge from acute hospitalization.     Follow Up Recommendations Home health PT    Equipment Recommendations  Rolling walker with 5" wheels    Recommendations for Other Services       Precautions / Restrictions Precautions Precautions: Posterior Hip Precaution Booklet Issued: Yes (comment) Restrictions Weight Bearing Restrictions: Yes RLE Weight Bearing: Weight bearing as tolerated      Mobility  Bed Mobility Overal bed mobility: Needs Assistance Bed Mobility: Supine to Sit;Sit to Supine     Supine to sit: Min guard Sit to supine: Min assist   General bed mobility comments: safe technique with upright posture. Able to maintain hip precautions.    Transfers Overall transfer level: Needs assistance Equipment used: Rolling walker (2 wheeled) Transfers: Sit to/from Stand Sit to Stand: Min guard        Lateral/Scoot Transfers: Min guard General transfer comment: Safe technique with upright posture.  Ambulation/Gait Ambulation/Gait assistance: Min guard Gait Distance (Feet): 75 Feet Assistive device: Rolling walker (2 wheeled) Gait Pattern/deviations: Step-to pattern     General Gait Details: ambulated in hallway with safe technique and step to gait pattern. Good weight  acceptance on surgical leg  Stairs Stairs: Yes Stairs assistance: Min guard Stair Management: One rail Right;Step to pattern Number of Stairs: 8 General stair comments: demonstrated prior to performance. Pt able to go up/down 8 steps to attempt simulation of going to bedroom. Safe technique. Also demonstrated 1 step into home  Wheelchair Mobility    Modified Rankin (Stroke Patients Only)       Balance Overall balance assessment: Needs assistance Sitting-balance support: Feet supported;No upper extremity supported Sitting balance-Leahy Scale: Good Sitting balance - Comments: Steady static sitting, reaching within BOS.   Standing balance support: Bilateral upper extremity supported Standing balance-Leahy Scale: Good Standing balance comment: Standing balance not tested.                             Pertinent Vitals/Pain Pain Assessment: 0-10 Pain Score: 3  Pain Location: RLE with functional mobility. Pain Descriptors / Indicators: Aching;Sore;Discomfort;Grimacing;Guarding Pain Intervention(s): Limited activity within patient's tolerance;Repositioned    Home Living Family/patient expects to be discharged to:: Private residence Living Arrangements: Spouse/significant other Available Help at Discharge: Family;Available 24 hours/day Type of Home: House Home Access: Stairs to enter Entrance Stairs-Rails: None Entrance Stairs-Number of Steps: 1 Home Layout: Two level;Bed/bath upstairs;1/2 bath on main level Home Equipment: Walker - 4 wheels;Shower seat;Toilet riser;Cane - single point      Prior Function Level of Independence: Independent         Comments: Pt active and independent with ADL management at baseline. Works full time, active job that requires him to be on his feet.     Hand Dominance   Dominant  Hand: Right    Extremity/Trunk Assessment   Upper Extremity Assessment Upper Extremity Assessment: Overall WFL for tasks assessed    Lower  Extremity Assessment Lower Extremity Assessment: Generalized weakness (R LE grossly 2/5; L LE grossly 4+/5) RLE Deficits / Details: s/p R THA with posterior hip precautions. RLE: Unable to fully assess due to pain    Cervical / Trunk Assessment Cervical / Trunk Assessment: Normal  Communication   Communication: No difficulties  Cognition Arousal/Alertness: Awake/alert Behavior During Therapy: WFL for tasks assessed/performed Overall Cognitive Status: Within Functional Limits for tasks assessed                                 General Comments: Pleasant, conversational t/o session.      General Comments General comments (skin integrity, edema, etc.): Hemovac/RLE dressings in place at start/end of session.    Exercises Other Exercises Other Exercises: Pt/caregiver (wife at bedside) educated on role of OT in acute setting, falls prevention strategies, posterior THPs, falls prevention strategies, safe use of AE/DME for ADL management, compression stocking management strategies, and routine smodifications to support safety and functional independence upon hospital DC. Other Exercises: supine ther-ex performed on R LE including AP, quad sets, glut sets, hip abd/add, hip add squeezes, SAQ/LAQ, and SLR. all ther-ex performed x 10 reps with cga/min assist   Assessment/Plan    PT Assessment Patient needs continued PT services  PT Problem List Decreased strength;Decreased activity tolerance;Decreased balance;Decreased mobility;Pain       PT Treatment Interventions DME instruction;Gait training;Stair training;Therapeutic exercise    PT Goals (Current goals can be found in the Care Plan section)  Acute Rehab PT Goals Patient Stated Goal: To go home PT Goal Formulation: With patient Time For Goal Achievement: 10/08/20 Potential to Achieve Goals: Good    Frequency BID   Barriers to discharge        Co-evaluation               AM-PAC PT "6 Clicks" Mobility   Outcome Measure Help needed turning from your back to your side while in a flat bed without using bedrails?: None Help needed moving from lying on your back to sitting on the side of a flat bed without using bedrails?: A Little Help needed moving to and from a bed to a chair (including a wheelchair)?: A Little Help needed standing up from a chair using your arms (e.g., wheelchair or bedside chair)?: A Little Help needed to walk in hospital room?: A Little Help needed climbing 3-5 steps with a railing? : A Little 6 Click Score: 19    End of Session Equipment Utilized During Treatment: Gait belt Activity Tolerance: Patient tolerated treatment well Patient left: in chair;with family/visitor present Nurse Communication: Mobility status PT Visit Diagnosis: Muscle weakness (generalized) (M62.81);Difficulty in walking, not elsewhere classified (R26.2);Pain Pain - Right/Left: Right Pain - part of body: Leg    Time: 5188-4166 PT Time Calculation (min) (ACUTE ONLY): 56 min   Charges:     PT Treatments $Gait Training: 23-37 mins $Therapeutic Exercise: 8-22 mins        Greggory Stallion, PT, DPT [phone]   Daleigh Pollinger 09/24/2020, 5:10 PM

## 2020-09-24 NOTE — Progress Notes (Addendum)
  Subjective: Day of Surgery Procedure(s) (LRB): TOTAL HIP ARTHROPLASTY (Right) Patient reports pain as well-controlled.   Patient is well, and has had no acute complaints or problems Plan is to go Home today after surgery. Negative for chest pain and shortness of breath Fever: no Patient has completed all PT goals and is ready for d/c home.  Objective: Vital signs in last 24 hours: Temp:  [97 F (36.1 C)-97.4 F (36.3 C)] 97 F (36.1 C) (12/22 1148) Pulse Rate:  [50-60] 50 (12/22 1318) Resp:  [15-20] 16 (12/22 1318) BP: (95-116)/(58-66) 106/62 (12/22 1318) SpO2:  [100 %] 100 % (12/22 1318) Weight:  [72.6 kg] 72.6 kg (12/22 0629)  Intake/Output from previous day:  Intake/Output Summary (Last 24 hours) at 09/24/2020 1647 Last data filed at 09/24/2020 1455 Gross per 24 hour  Intake 1100 ml  Output 975 ml  Net 125 ml    Intake/Output this shift: Total I/O In: 1100 [I.V.:1100] Out: 975 [Urine:900; Blood:75]  Labs: No results for input(s): HGB in the last 72 hours. No results for input(s): WBC, RBC, HCT, PLT in the last 72 hours. No results for input(s): NA, K, CL, CO2, BUN, CREATININE, GLUCOSE, CALCIUM in the last 72 hours. No results for input(s): LABPT, INR in the last 72 hours.   EXAM General - Patient is Alert, Appropriate and Oriented Extremity - Neurovascular intact Dorsiflexion/Plantar flexion intact Compartment soft Dressing/Incision -clean, dry, no drainage, Hemovac in place.  Motor Function - intact, moving foot and toes well on exam.    Assessment/Plan: Day of Surgery Procedure(s) (LRB): TOTAL HIP ARTHROPLASTY (Right) Active Problems:   Hx of total hip arthroplasty, right  Estimated body mass index is 25.82 kg/m as calculated from the following:   Height as of this encounter: 5\' 6"  (1.676 m).   Weight as of this encounter: 72.6 kg. Discharge home with home health  Hemovac removed. Mini compression dressing applied. Patient given 2 honeycomb  dressings with instructions.  Patient discharged home.   Cassell Smiles, PA-C [facility] Orthopa[facility] Surgery 09/24/2020, 4:47 PM

## 2020-09-24 NOTE — Op Note (Signed)
OPERATIVE NOTE  DATE OF SURGERY:  09/24/2020  PATIENT NAME:  Frank Cabrera   DOB: 1966-01-31  MRN: [id number]  PRE-OPERATIVE DIAGNOSIS: Degenerative arthrosis of the right hip, primary  POST-OPERATIVE DIAGNOSIS:  Same  PROCEDURE:  Right total hip arthroplasty  SURGEON:  Marciano Sequin. M.D.  ASSISTANT: Cassell Smiles, PA-C (present and scrubbed throughout the case, critical for assistance with exposure, retraction, instrumentation, and closure)  ANESTHESIA: spinal  ESTIMATED BLOOD LOSS: 100 mL  FLUIDS REPLACED: 1100 mL of crystalloid  DRAINS: 2 medium Hemovac  IMPLANTS UTILIZED: DePuy 16.5 mm small stature AML femoral stem, 54 mm OD Pinnacle 100 acetabular component, neutral Pinnacle Marathon polyethylene insert, and a 36 mm M-SPEC +1.5 mm hip ball  INDICATIONS FOR SURGERY: LUISALBERTO BEEGLE is a 54 y.o. year old male with a long history of progressive hip and groin  pain. X-rays demonstrated severe degenerative changes. The patient had not seen any significant improvement despite conservative nonsurgical intervention. After discussion of the risks and benefits of surgical intervention, the patient expressed understanding of the risks benefits and agree with plans for total hip arthroplasty.   The risks, benefits, and alternatives were discussed at length including but not limited to the risks of infection, bleeding, nerve injury, stiffness, blood clots, the need for revision surgery, limb length inequality, dislocation, cardiopulmonary complications, among others, and they were willing to proceed.  PROCEDURE IN DETAIL: The patient was brought into the operating room and, after adequate spinal anesthesia was achieved, the patient was placed in a left lateral decubitus position. Axillary roll was placed and all bony prominences were well-padded. The patient's right hip was cleaned and prepped with alcohol and DuraPrep and draped in the usual sterile fashion. A "timeout" was performed as per  usual protocol. A lateral curvilinear incision was made gently curving towards the posterior superior iliac spine. The IT band was incised in line with the skin incision and the fibers of the gluteus maximus were split in line. The piriformis tendon was identified, skeletonized, and incised at its insertion to the proximal femur and reflected posteriorly. A T type posterior capsulotomy was performed. Prior to dislocation of the femoral head, a threaded Steinmann pin was inserted through a separate stab incision into the pelvis superior to the acetabulum and bent in the form of a stylus so as to assess limb length and hip offset throughout the procedure. The femoral head was then dislocated posteriorly. Inspection of the femoral head demonstrated severe degenerative changes with full-thickness loss of articular cartilage. The femoral neck cut was performed using an oscillating saw. The anterior capsule was elevated off of the femoral neck using a periosteal elevator. Attention was then directed to the acetabulum. The remnant of the labrum was excised using electrocautery. Inspection of the acetabulum also demonstrated significant degenerative changes. The acetabulum was reamed in sequential fashion up to a 53 mm diameter. Good punctate bleeding bone was encountered. A 54 mm Pinnacle 100 acetabular component was positioned and impacted into place. Good scratch fit was appreciated. A neutral polyethylene trial was inserted.  Attention was then directed to the proximal femur. A hole for reaming of the proximal femoral canal was created using a high-speed burr. The femoral canal was reamed in sequential fashion up to a 16 mm diameter. This allowed for approximately 5 cm of scratch fit. Serial broaches were inserted up to a 16.5 mm small stature femoral broach. Calcar region was planed and a trial reduction was performed using a 36 mm hip  ball with a +1.5 mm neck length. Good equalization of limb lengths and hip offset  was appreciated and excellent stability was noted both anteriorly and posteriorly. Trial components were removed. The acetabular shell was irrigated with copious amounts of normal saline with antibiotic solution and suctioned dry. A neutral Pinnacle Marathon polyethylene insert was positioned and impacted into place. Next, a 16.5 mm small stature AML femoral stem was positioned and impacted into place. Excellent scratch fit was appreciated. A trial reduction was again performed with a 36 mm hip ball with a +1.5 mm neck length. Again, good equalization of limb lengths was appreciated and excellent stability appreciated both anteriorly and posteriorly. The hip was then dislocated and the trial hip ball was removed. The Morse taper was cleaned and dried. A 36 mm M-SPEC hip ball with a +1.5 mm neck length was placed on the trunnion and impacted into place. The hip was then reduced and placed through range of motion. Excellent stability was appreciated both anteriorly and posteriorly.  The wound was irrigated with copious amounts of normal saline followed by 500 ml of Surgiphor and suctioned dry. Good hemostasis was appreciated. The posterior capsulotomy was repaired using #5 Ethibond. Piriformis tendon was reapproximated to the undersurface of the gluteus medius tendon using #5 Ethibond. The IT band was reapproximated using interrupted sutures of #1 Vicryl. Subcutaneous tissue was approximated using first #0 Vicryl followed by #2-0 Vicryl. The skin was closed with skin staples.  The patient tolerated the procedure well and was transported to the recovery room in stable condition.   Marciano Sequin., M.D.

## 2020-09-24 NOTE — H&P (Signed)
ORTHOPAEDIC HISTORY & PHYSICAL Progress Notes - documented in this encounter  Table of Contents for Progress Notes  Regino Bellow, PA - 09/17/2020 8:30 AM EST  Gita Kudo, CMA - 09/17/2020 8:30 AM EST     Regino Bellow, PA - 09/17/2020 8:30 AM EST Formatting of this note is different from the original. Images from the original note were not included. Chief Complaint Chief Complaint  Patient presents with  . Right Hip - Follow-up, Pain   Reason for Visit Frank Cabrera is a 54 y.o. who presents today for history and physical. He is to undergo a right total hip arthroplasty on 09/24/2020. Was last seen in the clinic on 10/16/2019. No change in his condition since that other than having increasing discomfort.  He reports a 10 year(s) history of right hip pain that has significantly increased in severity over the last several months. The pain is worse at night and with activities such as playing doubles tennis. He has appreciated progressive decrease in his right hip range of motion. He also has stiffness after inactivity. The symptoms have begun to limit his ambulation distance.The patient has not appreciated any significant improvement despite glucosamine/chondroitin, NSAIDs, intraarticular corticosteroid injection to the hip, and activity modification. He is not using any ambulatory aids. The patient states that the hip pain has progressed to the point that it is significantly interfering with his activities of daily living.  Past Medical History Past Medical History:  Diagnosis Date  . Acid reflux  symptoms  . Chicken pox  . Chronic cough  . Esophageal reflux  . HSV-2 (herpes simplex virus 2) infection 2003  . Myasthenia gravis (CMS-HCC)  w/ moderate leg weakness  . Osteoporosis  steroid induced  . Pansinusitis  . PONV (postoperative nausea and vomiting)  . RLS (restless legs syndrome)   Past Surgical History Past Surgical History:  Procedure Laterality Date   . AMPUTATION FOREARM Left 02/12/2020  Procedure: Left ARTHRODESIS, DISTAL RADIOULNAR JOINT WITH SEGMENTAL RESECTION OF ULNA, WITH OR WITHOUT BONE GRAFT; Surgeon: Clarisa Schools, MD; Location: [facility] OR; Service: Orthopedics; Laterality: Left;  . deviated suptum surgery 2013  . INTRAOPERATIVE FLUOROSCOPY Left 02/12/2020  Procedure: FLUOROSCOPY; Surgeon: Clarisa Schools, MD; Location: [facility] OR; Service: Orthopedics; Laterality: Left;  . Laryngoscopy  . STRABISMUS EYE SURGERY 2006  . THYMECTOMY 1995  . TRANSPLANT/TRANSFER TENDON FOREARM &/WRIST W/GRAFT Left 02/12/2020  Procedure: TENDON TRANSPLANTATION OR TRANSFER, FLEXOR OR EXTENSOR, FOREARM AND/OR WRIST; Surgeon: Clarisa Schools, MD; Location: [facility] OR; Service: Orthopedics; Laterality: Left;  . WISDOM TEETH   Past Family History Family History  Problem Relation Age of Onset  . No Known Problems Mother  . Heart disease Father  . Glaucoma Father  . No Known Problems Sister   Medications Current Outpatient Medications Ordered in Epic  Medication Sig Dispense Refill  . alendronate (FOSAMAX) 35 MG tablet TAKE 1 TABLET BY MOUTH ONE TIME PER WEEK  . aspirin 81 MG chewable tablet Take 81 mg by mouth once daily  . azaTHIOprine (IMURAN) 50 mg tablet Take 3 tablets (150 mg total) by mouth once daily 270 tablet 3  . calcium citrate 250 mg calcium Tab Take 1 tablet by mouth once daily  . ergocalciferol, vitamin D2, 1,250 mcg (50,000 unit) capsule Take 50,000 Units by mouth  . flaxseed oiL 1,000 mg Cap Take by mouth  . folic acid (FOLVITE) 1 MG tablet Take 1 tablet (1 mg total) by mouth once daily 30 tablet 11  . glucosamine-chondroitin  250-200 mg Tab Take 160 mg by mouth once daily  . multivitamin tablet Take by mouth  . omega-3 fatty acids/fish oil 340-1,000 mg capsule Take 1 capsule by mouth 2 (two) times daily.  Marland Kitchen omeprazole (PRILOSEC) 40 MG DR capsule TAKE 1 CAPSULE BY MOUTH EVERY DAY ON EN EMPTY STOMACH  . pramipexole (MIRAPEX) 0.5 MG tablet  Take 0.5 mg by mouth nightly  . predniSONE (DELTASONE) 10 MG tablet Take 25mg  PO every other day 90 tablet 3  . rosuvastatin (CRESTOR) 5 MG tablet Take by mouth   No current Epic-ordered facility-administered medications on file.   Allergies Allergies  Allergen Reactions  . Cefadroxil Hives and Rash    Review of Systems A comprehensive 14 point ROS was performed, reviewed, and the pertinent orthopaedic findings are documented in the HPI.  Exam BP 112/68  Ht 165.1 cm (5\' 5" )  Wt 71.2 kg (156 lb 14.4 oz)  BMI 26.11 kg/m   General: Well-developed well-nourished male seen in no acute distress.   HEENT: Atraumatic,normocephalic. Pupils are equal and reactive to light. Oropharynx is clear with moist mucosa  Lungs: Clear to auscultation bilaterally   Cardiovascular: Regular rate and rhythm. Normal S1, S2. No murmurs. No appreciable gallops or rubs. Peripheral pulses are palpable.  Abdomen: Soft, non-tender, nondistended. Bowel sounds present  Extremity:  Right Hip: Pelvic tilt: Negative Limb lengths: Equal with the patient standing Soft tissue swelling: Negative Erythema: Negative Crepitance: Negative Tenderness: Greater trochanter is nontender to palpation. Moderate pain is elicited by axial compression or extremes of rotation. Atrophy: No atrophy. Good hip flexor and abductor strength. Range of Motion: EXT/FLEX: 0/0/100 ADD/ABD: 20/0/20 IR/ER: 0/0/20   Neurological:  The patient is alert and oriented Sensation to light touch appears to be intact and within normal limits Gross motor strength appeared to be equal to 5/5  Vascular :  Peripheral pulses felt to be palpable. Capillary refill appears to be intact and within normal limits  X-ray  1. 3 views of the right hip ordered and interpreted on today's visit shows complete loss of the articular space superiorly. Is noted to have significant narrowing elsewise to the articular space. Subchondral sclerosis as  well as large osteophyte formations are noted. Irregularity of the shape of the head is noted.  Impression  1. Degenerative arthrosis right  Plan   1. Patient is to discontinue his aspirin and any anti-inflammatories as of today 2. Return to clinic 6 weeks postop. Sooner if any problems 3. Did spend approximately 30 minutes with this patient discussing his surgery as well as postop  This note was generated in part with voice recognition software and I apologize for any typographical errors that were not detected and corrected   Watt Climes PA  Electronically signed by Regino Bellow, PA at 09/17/2020 9:32 AM EST

## 2020-09-24 NOTE — Evaluation (Signed)
Occupational Therapy Evaluation Patient Details Name: Frank Cabrera MRN: [id number] DOB: 05/16/1966 Today's Date: 09/24/2020    History of Present Illness Frank Cabrera is a 54 y.o. male who presents with degenerative arthrosis of the R hip. He is now s/p R THA. PMH includes HSV II, Myasthenia gravis, osteoporosis.   Clinical Impression   Frank Cabrera was seen for OT evaluation this date, POD#0 from above surgery. Pt was active and independent in all ADLs prior to surgery. He endorses playing tennis on the weekends and working a full-time active job. Pt is eager to return to PLOF with less pain and improved safety and independence. Pt currently requires minimal assist for LB dressing while in seated position due to pain and limited AROM of his R hip. Pt able to recall 1/3 posterior total hip precautions at start of session and unable to verbalize how to implement during ADL and mobility. Pt instructed in posterior total hip precautions and how to implement, self care skills, falls prevention strategies, home/routines modifications, DME/AE for LB bathing and dressing tasks, compression stocking mgt strategies, and car transfer techniques. At end of session, pt able to recall 3/3 posterior total hip precautions. Pt would benefit from additional instruction in self care skills and techniques to help maintain precautions with or without assistive devices to support recall and carryover prior to discharge. Recommend HHOT upon discharge.       Follow Up Recommendations  Home health OT;Supervision - Intermittent    Equipment Recommendations  3 in 1 bedside commode    Recommendations for Other Services       Precautions / Restrictions Precautions Precautions: Posterior Hip Precaution Booklet Issued: Yes (comment) Restrictions Weight Bearing Restrictions: Yes RLE Weight Bearing: Weight bearing as tolerated      Mobility Bed Mobility Overal bed mobility: Needs Assistance Bed Mobility: Supine to  Sit;Sit to Supine     Supine to sit: Supervision;HOB elevated Sit to supine: Min assist   General bed mobility comments: Min A for sit>sup for RLE mgt over EOB. Pt able to come to sitting at EOB with supervision for safety. Increased use of bed rails to perform.    Transfers Overall transfer level: Needs assistance   Transfers: Lateral/Scoot Transfers          Lateral/Scoot Transfers: Min guard General transfer comment: SBA for mgt of lines/leads during lateral scoot at EOB.    Balance Overall balance assessment: Needs assistance Sitting-balance support: Feet supported;No upper extremity supported Sitting balance-Leahy Scale: Good Sitting balance - Comments: Steady static sitting, reaching within BOS.       Standing balance comment: Standing balance not tested.                           ADL either performed or assessed with clinical judgement   ADL Overall ADL's : Needs assistance/impaired                                       General ADL Comments: Pt is functionally limited by RLE pain and decreased AROM. He requires MIN A for LB ADL management for improved comfort, safety, and adherence to posterior THPs.     Vision Baseline Vision/History: Wears glasses Wears Glasses: At all times Patient Visual Report: No change from baseline       Perception     Praxis  Pertinent Vitals/Pain Pain Assessment: 0-10 Pain Score: 7  Pain Location: RLE with functional mobility. Pain Descriptors / Indicators: Aching;Sore;Discomfort;Grimacing;Guarding Pain Intervention(s): Limited activity within patient's tolerance;Monitored during session;Premedicated before session;Repositioned;Ice applied     Hand Dominance Right   Extremity/Trunk Assessment Upper Extremity Assessment Upper Extremity Assessment: Overall WFL for tasks assessed   Lower Extremity Assessment Lower Extremity Assessment: RLE deficits/detail;Defer to PT evaluation RLE Deficits  / Details: s/p R THA with posterior hip precautions. RLE: Unable to fully assess due to pain   Cervical / Trunk Assessment Cervical / Trunk Assessment: Normal   Communication Communication Communication: No difficulties   Cognition Arousal/Alertness: Awake/alert Behavior During Therapy: WFL for tasks assessed/performed Overall Cognitive Status: Within Functional Limits for tasks assessed                                 General Comments: Pleasant, conversational t/o session.   General Comments  Hemovac/RLE dressings in place at start/end of session.    Exercises Other Exercises Other Exercises: Pt/caregiver (wife at bedside) educated on role of OT in acute setting, falls prevention strategies, posterior THPs, falls prevention strategies, safe use of AE/DME for ADL management, compression stocking management strategies, and routine smodifications to support safety and functional independence upon hospital DC.   Shoulder Instructions      Home Living Family/patient expects to be discharged to:: Private residence Living Arrangements: Spouse/significant other Available Help at Discharge: Family;Available 24 hours/day Type of Home: House Home Access: Stairs to enter [employer] of Steps: 1 Entrance Stairs-Rails: None Home Layout: Two level;Bed/bath upstairs;1/2 bath on main level Alternate Level Stairs-Number of Steps: Full flight c R hand rail Alternate Level Stairs-Rails: Right Bathroom Shower/Tub: Walk-in shower   Bathroom Toilet: Handicapped height     Home Equipment: Environmental consultant - 4 wheels;Shower seat;Toilet riser;Cane - single point          Prior Functioning/Environment Level of Independence: Independent        Comments: Pt active and independent with ADL management at baseline. Works full time, active job that requires him to be on his feet.        OT Problem List: Decreased strength;Decreased coordination;Pain;Decreased range of  motion;Decreased safety awareness;Impaired balance (sitting and/or standing);Decreased knowledge of use of DME or AE;Decreased knowledge of precautions      OT Treatment/Interventions: Self-care/ADL training;Therapeutic exercise;Therapeutic activities;DME and/or AE instruction;Patient/family education;Balance training    OT Goals(Current goals can be found in the care plan section) Acute Rehab OT Goals Patient Stated Goal: To go home OT Goal Formulation: With patient Time For Goal Achievement: 10/08/20 Potential to Achieve Goals: Good ADL Goals Pt Will Perform Upper Body Dressing: with modified independence;sitting Pt Will Perform Lower Body Dressing: sit to/from stand;with modified independence (c LRAD PRN for improved safety and functional indep. upon hospital DC.) Pt Will Transfer to Toilet: with modified independence;bedside commode;ambulating (c LRAD PRN for improved safety and functional indep. upon hospital DC.)  OT Frequency: Min 1X/week   Barriers to D/C: Inaccessible home environment          Co-evaluation              AM-PAC OT "6 Clicks" Daily Activity     Outcome Measure Help from another person eating meals?: None Help from another person taking care of personal grooming?: None Help from another person toileting, which includes using toliet, bedpan, or urinal?: A Little Help from another person bathing (including washing, rinsing, drying)?:  A Little Help from another person to put on and taking off regular upper body clothing?: A Little Help from another person to put on and taking off regular lower body clothing?: A Little 6 Click Score: 20   End of Session Nurse Communication: Mobility status  Activity Tolerance: Patient tolerated treatment well Patient left: in bed;with call bell/phone within reach;with family/visitor present  OT Visit Diagnosis: Other abnormalities of gait and mobility (R26.89);Muscle weakness (generalized) (M62.81)                Time:  3532-9924 OT Time Calculation (min): 45 min Charges:  OT General Charges $OT Visit: 1 Visit OT Evaluation $OT Eval Moderate Complexity: 1 Mod OT Treatments $Self Care/Home Management : 23-37 mins  Shara Blazing, M.S., OTR/L Ascom: [phone] 09/24/20, 4:02 PM

## 2020-09-24 NOTE — Transfer of Care (Signed)
Immediate Anesthesia Transfer of Care Note  Patient: Frank Cabrera  Procedure(s) Performed: TOTAL HIP ARTHROPLASTY (Right Hip)  Patient Location: PACU  Anesthesia Type:Spinal  Level of Consciousness: drowsy  Airway & Oxygen Therapy: Patient Spontanous Breathing and Patient connected to face mask oxygen  Post-op Assessment: Report given to RN  Post vital signs: stable  Last Vitals:  Vitals Value Taken Time  BP 95/59 09/24/20 1042  Temp    Pulse 52 09/24/20 1043  Resp 16 09/24/20 1043  SpO2 100 % 09/24/20 1043  Vitals shown include unvalidated device data.  Last Pain:  Vitals:   09/24/20 0629  TempSrc: Oral  PainSc: 2          Complications: No complications documented.

## 2020-09-24 NOTE — Anesthesia Preprocedure Evaluation (Signed)
Anesthesia Evaluation  Patient identified by MRN, date of birth, ID band Patient awake    Reviewed: Allergy & Precautions, H&P , NPO status , Patient's Chart, lab work & pertinent test results  History of Anesthesia Complications (+) PONVNegative for: history of anesthetic complications  Airway Mallampati: II  TM Distance: >3 FB Neck ROM: full    Dental  (+) Teeth Intact   Pulmonary asthma , neg sleep apnea, neg COPD,    breath sounds clear to auscultation       Cardiovascular (-) angina+ CAD  (-) Past MI and (-) Cardiac Stents (-) dysrhythmias  Rhythm:regular Rate:Normal     Neuro/Psych Myasthenia gravis negative neurological ROS  negative psych ROS   GI/Hepatic Neg liver ROS, GERD  ,  Endo/Other  negative endocrine ROS  Renal/GU      Musculoskeletal   Abdominal   Peds  Hematology negative hematology ROS (+)   Anesthesia Other Findings Past Medical History: No date: GERD (gastroesophageal reflux disease) 2003: Herpes simplex type II infection No date: Myasthenia gravis ([place]) No date: Myasthenia gravis associated with thymoma ([place]) No date: Osteoporosis No date: Pansinusitis No date: PONV (postoperative nausea and vomiting) No date: Restless leg syndrome  Past Surgical History: 2008: EYE MUSCLE SURGERY     Comment:  for strabismus, [place],  Board 2021: HAND TENDON SURGERY; Left March 2013: SEPTOPLASTY 2006: [place]: TOTAL THYMECTOMY  BMI    Body Mass Index: 25.82 kg/m      Reproductive/Obstetrics negative OB ROS                             Anesthesia Physical Anesthesia Plan  ASA: II  Anesthesia Plan: Spinal   Post-op Pain Management:    Induction:   PONV Risk Score and Plan: Propofol infusion and Ondansetron  Airway Management Planned: Simple Face Mask  Additional Equipment:   Intra-op Plan:   Post-operative Plan:   Informed Consent: I  have reviewed the patients History and Physical, chart, labs and discussed the procedure including the risks, benefits and alternatives for the proposed anesthesia with the patient or authorized representative who has indicated his/her understanding and acceptance.     Dental Advisory Given  Plan Discussed with: Anesthesiologist, CRNA and Surgeon  Anesthesia Plan Comments:         Anesthesia Quick Evaluation

## 2020-09-25 ENCOUNTER — Encounter: Payer: Self-pay | Admitting: Orthopedic Surgery

## 2020-09-29 LAB — SURGICAL PATHOLOGY

## 2020-09-29 NOTE — Anesthesia Postprocedure Evaluation (Signed)
Anesthesia Post Note  Patient: Frank Cabrera  Procedure(s) Performed: TOTAL HIP ARTHROPLASTY (Right Hip)  Patient location during evaluation: PACU Anesthesia Type: Spinal Level of consciousness: awake and alert Pain management: pain level controlled Vital Signs Assessment: post-procedure vital signs reviewed and stable Respiratory status: spontaneous breathing, nonlabored ventilation and respiratory function stable Cardiovascular status: blood pressure returned to baseline and stable Postop Assessment: spinal receding Anesthetic complications: no   No complications documented.   Last Vitals:  Vitals:   09/24/20 1721 09/24/20 1852  BP: (!) 98/54 (!) 103/48  Pulse: (!) 52 (!) 57  Resp: 16 16  Temp: 36.7 C 36.7 C  SpO2: 100% 100%    Last Pain:  Vitals:   09/24/20 1852  TempSrc: Temporal  PainSc: 2                  Karleen Hampshire

## 2020-10-07 ENCOUNTER — Other Ambulatory Visit: Payer: Self-pay | Admitting: Internal Medicine

## 2020-10-07 DIAGNOSIS — E559 Vitamin D deficiency, unspecified: Secondary | ICD-10-CM

## 2020-10-07 NOTE — Telephone Encounter (Signed)
Last refill: 07/23/2019 Last OV: 08/08/2019 Next OV: 10/16/2020 Last Lab: 10/16/2018

## 2020-10-14 ENCOUNTER — Other Ambulatory Visit: Payer: Self-pay

## 2020-10-14 ENCOUNTER — Other Ambulatory Visit (INDEPENDENT_AMBULATORY_CARE_PROVIDER_SITE_OTHER): Payer: No Typology Code available for payment source

## 2020-10-14 DIAGNOSIS — D72810 Lymphocytopenia: Secondary | ICD-10-CM | POA: Diagnosis not present

## 2020-10-14 DIAGNOSIS — E559 Vitamin D deficiency, unspecified: Secondary | ICD-10-CM | POA: Diagnosis not present

## 2020-10-14 LAB — CBC WITH DIFFERENTIAL/PLATELET
Basophils Absolute: 0.1 10*3/uL (ref 0.0–0.1)
Basophils Relative: 1 % (ref 0.0–3.0)
Eosinophils Absolute: 0.1 10*3/uL (ref 0.0–0.7)
Eosinophils Relative: 1.8 % (ref 0.0–5.0)
HCT: 36.2 % — ABNORMAL LOW (ref 39.0–52.0)
Hemoglobin: 12.2 g/dL — ABNORMAL LOW (ref 13.0–17.0)
Lymphocytes Relative: 18.7 % (ref 12.0–46.0)
Lymphs Abs: 1.4 10*3/uL (ref 0.7–4.0)
MCHC: 33.8 g/dL (ref 30.0–36.0)
MCV: 91.5 fl (ref 78.0–100.0)
Monocytes Absolute: 0.7 10*3/uL (ref 0.1–1.0)
Monocytes Relative: 8.7 % (ref 3.0–12.0)
Neutro Abs: 5.4 10*3/uL (ref 1.4–7.7)
Neutrophils Relative %: 69.8 % (ref 43.0–77.0)
Platelets: 438 10*3/uL — ABNORMAL HIGH (ref 150.0–400.0)
RBC: 3.96 Mil/uL — ABNORMAL LOW (ref 4.22–5.81)
RDW: 14.2 % (ref 11.5–15.5)
WBC: 7.7 10*3/uL (ref 4.0–10.5)

## 2020-10-14 LAB — VITAMIN D 25 HYDROXY (VIT D DEFICIENCY, FRACTURES): VITD: 43.22 ng/mL (ref 30.00–100.00)

## 2020-10-16 ENCOUNTER — Ambulatory Visit (INDEPENDENT_AMBULATORY_CARE_PROVIDER_SITE_OTHER): Payer: No Typology Code available for payment source

## 2020-10-16 ENCOUNTER — Encounter: Payer: Self-pay | Admitting: Internal Medicine

## 2020-10-16 ENCOUNTER — Other Ambulatory Visit: Payer: Self-pay

## 2020-10-16 ENCOUNTER — Ambulatory Visit (INDEPENDENT_AMBULATORY_CARE_PROVIDER_SITE_OTHER): Payer: No Typology Code available for payment source | Admitting: Internal Medicine

## 2020-10-16 VITALS — BP 100/68 | HR 60 | Temp 98.1°F | Resp 14 | Ht 66.0 in | Wt 158.4 lb

## 2020-10-16 DIAGNOSIS — R202 Paresthesia of skin: Secondary | ICD-10-CM

## 2020-10-16 DIAGNOSIS — G629 Polyneuropathy, unspecified: Secondary | ICD-10-CM | POA: Diagnosis not present

## 2020-10-16 DIAGNOSIS — Z113 Encounter for screening for infections with a predominantly sexual mode of transmission: Secondary | ICD-10-CM

## 2020-10-16 DIAGNOSIS — Z23 Encounter for immunization: Secondary | ICD-10-CM

## 2020-10-16 DIAGNOSIS — G2581 Restless legs syndrome: Secondary | ICD-10-CM

## 2020-10-16 DIAGNOSIS — R0989 Other specified symptoms and signs involving the circulatory and respiratory systems: Secondary | ICD-10-CM

## 2020-10-16 DIAGNOSIS — I2510 Atherosclerotic heart disease of native coronary artery without angina pectoris: Secondary | ICD-10-CM

## 2020-10-16 LAB — B12 AND FOLATE PANEL
Folate: >23.6 ng/mL (ref >5.9)
Vitamin B-12: 360 pg/mL (ref 211–911)

## 2020-10-16 LAB — BASIC METABOLIC PANEL
BUN: 16 mg/dL (ref 6–23)
CO2: 30 mEq/L (ref 19–32)
Calcium: 9.3 mg/dL (ref 8.4–10.5)
Chloride: 104 mEq/L (ref 96–112)
Creatinine, Ser: 0.87 mg/dL (ref 0.40–1.50)
GFR: 99.38 mL/min (ref >60.00)
Glucose, Bld: 82 mg/dL (ref 70–99)
Potassium: 4 mEq/L (ref 3.5–5.1)
Sodium: 140 mEq/L (ref 135–145)

## 2020-10-16 LAB — TSH: TSH: 1.38 u[IU]/mL (ref 0.35–4.50)

## 2020-10-16 LAB — HEMOGLOBIN A1C: Hgb A1c MFr Bld: 5.4 % (ref 4.6–6.5)

## 2020-10-16 NOTE — Patient Instructions (Addendum)
Piscoya and Cintron Ferrel Logan  Are both excellent surgeons   [facility] referral to evaluation Lower extremity circulation    follow up 3 weeks   You received the Tdap vaccine today

## 2020-10-16 NOTE — Progress Notes (Signed)
Subjective:  Patient ID: Frank Cabrera, male    DOB: Apr 06, 1966  Age: 55 y.o. MRN: [id number]  CC: The primary encounter diagnosis was Neuropathy. Diagnoses of Hypocalcemia, Restless legs syndrome, Screening for STD (sexually transmitted disease), Need for Tdap vaccination, Atherosclerosis of native coronary artery of native heart without angina pectoris, Tingling in extremities, and Decreased pulses in feet were also pertinent to this visit.  HPI Frank Cabrera presents for follow up on recent labs ordered by another provider.  Last seen Nov 2020.  He has multiple concerns today   This visit occurred during the SARS-CoV-2 public health emergency.  Safety protocols were in place, including screening questions prior to the visit, additional usage of staff PPE, and extensive cleaning of exam room while observing appropriate contact time as indicated for disinfecting solutions.   He Is 55 yr old male with myasthenia gravis ,  Managed by [facility] , recently underwent a  right hip replacement in December ,  Presents with multiple complaints.  Recent labs were notable for lymphopenia with normal WBC.  Repeat CBC done jan 11 was normal   Tingly armpits for 5 months. Described as  mildly painful, resolved now.  worse with supine sleeping position   Inguinal hernia on the right side (per patient)  For the last 3-4 months.  Reduced it manually when it popped it during straining.  Wants to see a surgeon,  Does not need a referral   Exam deferred by patient   Right 5th finger and right foot goes numb when sleeping , has woken him up in pain .Marland Kitchen started after his hip replacement. Had a spinal block .  Has also reported neck pain after playing tennis the Saturday before surgery  (Dec 22) ,  Improved during convalescence  When on pain meds  But now  That he is off of pain meds and more active the pain returned,  It does not radiate into arms but down into middle of chest .  Denies any history of low back pain .    Plain  films from 2016 reviewed,  Facet hyprterophy noted fro ct to T1   Both ankles feeling numb and painful,  Left foot has been feeling cold  Denies claudication symptoms. Symptoms are worse with supine position and in the morning and improve with exercise.   Cap refill sluggish bilaterally and DP pulses are very faint.   Takes vitamin D 50K every other day.  Started a MVI with b12  3 days ago after reading about b12 deficiency on internet.     Outpatient Medications Prior to Visit  Medication Sig Dispense Refill  . alendronate (FOSAMAX) 35 MG tablet TAKE 1 TABLET BY MOUTH ONE TIME PER WEEK (Patient taking differently: Take 35 mg by mouth every 7 (seven) days.) 12 tablet 1  . azaTHIOprine (IMURAN) 50 MG tablet Take 150 mg by mouth daily.    Marland Kitchen CALCIUM PO Take 500 mg by mouth daily before breakfast.     . celecoxib (CELEBREX) 200 MG capsule Take 1 capsule (200 mg total) by mouth 2 (two) times daily. 90 capsule 0  . Flaxseed, Linseed, (FLAXSEED OIL) 1000 MG CAPS Take 1,000 mg by mouth daily.    . folic acid (FOLVITE) 1 MG tablet Take 1 mg by mouth daily.    Marland Kitchen omeprazole (PRILOSEC) 40 MG capsule TAKE 1 CAPSULE BY MOUTH EVERY DAY (Patient taking differently: Take 40 mg by mouth daily. TAKE 1 CAPSULE BY MOUTH EVERY DAY) 90  capsule 0  . oxyCODONE (OXY IR/ROXICODONE) 5 MG immediate release tablet Take 1 tablet (5 mg total) by mouth every 4 (four) hours as needed for moderate pain (pain score 4-6). 30 tablet 0  . pramipexole (MIRAPEX) 0.5 MG tablet TAKE 2 TABLETS (1 MG TOTAL) BY MOUTH AT BEDTIME. 180 tablet 1  . predniSONE (DELTASONE) 10 MG tablet Take 25 mg by mouth every other day.    . rosuvastatin (CRESTOR) 5 MG tablet TAKE 1 TABLET BY MOUTH EVERY DAY (Patient taking differently: Take 5 mg by mouth daily.) 90 tablet 1  . Vitamin D, Ergocalciferol, (DRISDOL) 1.25 MG (50000 UNIT) CAPS capsule Take 1 capsule (50,000 Units total) by mouth every 14 (fourteen) days. 2 capsule 0  . enoxaparin (LOVENOX) 40  MG/0.4ML injection Inject 0.4 mLs (40 mg total) into the skin daily for 14 days. 5.6 mL 0  . traMADol (ULTRAM) 50 MG tablet Take 1 tablet (50 mg total) by mouth every 4 (four) hours as needed for moderate pain. (Patient not taking: Reported on 10/16/2020) 30 tablet 0   No facility-administered medications prior to visit.    Review of Systems;  Patient denies headache, fevers, malaise, unintentional weight loss, skin rash, eye pain, sinus congestion and sinus pain, sore throat, dysphagia,  hemoptysis , cough, dyspnea, wheezing, chest pain, palpitations, orthopnea, edema, abdominal pain, nausea, melena, diarrhea, constipation, flank pain, dysuria, hematuria, urinary  Frequency, nocturia, numbness, tingling, seizures,  Focal weakness, Loss of consciousness,  Tremor, insomnia, depression, anxiety, and suicidal ideation.      Objective:  BP 100/68 (BP Location: Left Arm, Patient Position: Sitting, Cuff Size: Normal)   Pulse 60   Temp 98.1 F (36.7 C) (Oral)   Resp 14   Ht 5\' 6"  (1.676 m)   Wt 158 lb 6.4 oz (71.8 kg)   SpO2 99%   BMI 25.57 kg/m   BP Readings from Last 3 Encounters:  10/16/20 100/68  09/24/20 (!) 103/48  09/18/20 100/67    Wt Readings from Last 3 Encounters:  10/16/20 158 lb 6.4 oz (71.8 kg)  09/24/20 160 lb (72.6 kg)  02/19/20 159 lb 2 oz (72.2 kg)    General appearance: alert, cooperative and appears stated age Ears: normal TM's and external ear canals both ears Throat: lips, mucosa, and tongue normal; teeth and gums normal Neck: no adenopathy, no carotid bruit, supple, symmetrical, trachea midline and thyroid not enlarged, symmetric, no tenderness/mass/nodules Back: symmetric, no curvature. ROM normal. No CVA tenderness. Lungs: clear to auscultation bilaterally Heart: regular rate and rhythm, S1, S2 normal, no murmur, click, rub or gallop Abdomen: soft, non-tender; bowel sounds normal; no masses,  no organomegaly Pulses: 2+ and symmetric Skin: Skin color,  texture, turgor normal. No rashes or lesions MSK  Negative Tinel's and Phalen's signs  No loss of strength bilaterally   Lab Results  Component Value Date   HGBA1C 5.4 10/16/2020    Lab Results  Component Value Date   CREATININE 0.87 10/16/2020   CREATININE 0.83 09/18/2020   CREATININE 0.98 05/26/2020    Lab Results  Component Value Date   WBC 7.7 10/14/2020   HGB 12.2 (L) 10/14/2020   HCT 36.2 (L) 10/14/2020   PLT 438.0 (H) 10/14/2020   GLUCOSE 82 10/16/2020   CHOL 143 04/23/2020   TRIG 44.0 04/23/2020   HDL 62.80 04/23/2020   LDLCALC 71 04/23/2020   ALT 15 09/18/2020   AST 19 09/18/2020   NA 140 10/16/2020   K 4.0 10/16/2020   CL 104  10/16/2020   CREATININE 0.87 10/16/2020   BUN 16 10/16/2020   CO2 30 10/16/2020   TSH 1.38 10/16/2020   INR 0.9 09/18/2020   HGBA1C 5.4 10/16/2020    No results found.  Assessment & Plan:   Problem List Items Addressed This Visit      Unprioritized   Coronary atherosclerosis    He is tolerating rosuvastatin 5 mg daily  Lipids are at goal and lfts are norml  Lab Results  Component Value Date   CHOL 143 04/23/2020   HDL 62.80 04/23/2020   LDLCALC 71 04/23/2020   TRIG 44.0 04/23/2020   CHOLHDL 2 04/23/2020   Lab Results  Component Value Date   ALT 15 09/18/2020   AST 19 09/18/2020   GGT 15 10/01/2015   ALKPHOS 36 (L) 09/18/2020   BILITOT 0.7 09/18/2020         Relevant Orders   Ambulatory referral to Vascular Surgery   Decreased pulses in feet    He has diminished pulses and sluggish cap refill .  Referral to vascular surgery discussed      Relevant Orders   Ambulatory referral to Vascular Surgery   Tingling in extremities    Screening labs normal.  Exam normal.  Cervical spine films not suggestive of significant disk disease.  Given his recent hip surgery less than one month ago,  Advised to continue surveillance and referr to neurology if symptoms persist at 4 weeks   Lab Results  Component Value Date    VITAMINB12 360 10/16/2020   Lab Results  Component Value Date   TSH 1.38 10/16/2020   Lab Results  Component Value Date   HGBA1C 5.4 10/16/2020   Lab Results  Component Value Date   FOLATE >23.6 10/16/2020   Lab Results  Component Value Date   CALCIUM 9.3 10/16/2020         Other Visit Diagnoses    Neuropathy    -  Primary   Relevant Orders   B12 and Folate Panel (Completed)   TSH (Completed)   Hemoglobin A1c (Completed)   DG Cervical Spine Complete (Completed)   Hypocalcemia       Relevant Orders   Calcium, ionized (Completed)   Basic metabolic panel (Completed)   Restless legs syndrome       Relevant Orders   Iron, TIBC and Ferritin Panel (Completed)   Screening for STD (sexually transmitted disease)       Relevant Orders   Hepatitis C antibody (Completed)   HIV Antibody (routine testing w rflx) (Completed)   Need for Tdap vaccination       Relevant Orders   Tdap vaccine greater than or equal to 7yo IM (Completed)     A total of 40 minutes was spent with patient more than half of which was spent in counseling patient on the above mentioned issues , reviewing and explaining recent labs and imaging studies done, and coordination of care. I have discontinued Marta Lamas. Setterlund's traMADol and enoxaparin. I am also having him maintain his Flaxseed Oil, CALCIUM PO, folic acid, azaTHIOprine, omeprazole, rosuvastatin, alendronate, pramipexole, predniSONE, celecoxib, oxyCODONE, and Vitamin D (Ergocalciferol).  No orders of the defined types were placed in this encounter.   Medications Discontinued During This Encounter  Medication Reason  . enoxaparin (LOVENOX) 40 MG/0.4ML injection   . traMADol (ULTRAM) 50 MG tablet     Follow-up: Return in about 3 weeks (around 11/06/2020).   [name], MD

## 2020-10-17 LAB — HIV ANTIBODY (ROUTINE TESTING W REFLEX): HIV 1&2 Ab, 4th Generation: NONREACTIVE

## 2020-10-17 LAB — IRON,TIBC AND FERRITIN PANEL
%SAT: 32 % (calc) (ref 20–48)
Ferritin: 34 ng/mL — ABNORMAL LOW (ref 38–380)
Iron: 116 ug/dL (ref 50–180)
TIBC: 368 mcg/dL (calc) (ref 250–425)

## 2020-10-17 LAB — CALCIUM, IONIZED: Calcium, Ion: 5.1 mg/dL (ref 4.8–5.6)

## 2020-10-17 LAB — HEPATITIS C ANTIBODY
Hepatitis C Ab: NONREACTIVE
SIGNAL TO CUT-OFF: 0.01 (ref <1.00)

## 2020-10-18 ENCOUNTER — Encounter: Payer: Self-pay | Admitting: Internal Medicine

## 2020-10-18 DIAGNOSIS — R0989 Other specified symptoms and signs involving the circulatory and respiratory systems: Secondary | ICD-10-CM | POA: Insufficient documentation

## 2020-10-18 DIAGNOSIS — R202 Paresthesia of skin: Secondary | ICD-10-CM | POA: Insufficient documentation

## 2020-10-18 NOTE — Assessment & Plan Note (Signed)
He has diminished pulses and sluggish cap refill .  Referral to vascular surgery discussed

## 2020-10-18 NOTE — Assessment & Plan Note (Addendum)
Screening labs normal.  Exam normal.  Cervical spine films not suggestive of significant disk disease.  Given his recent hip surgery less than one month ago,  Advised to continue surveillance and referr to neurology if symptoms persist at 4 weeks   Lab Results  Component Value Date   VITAMINB12 360 10/16/2020   Lab Results  Component Value Date   TSH 1.38 10/16/2020   Lab Results  Component Value Date   HGBA1C 5.4 10/16/2020   Lab Results  Component Value Date   FOLATE >23.6 10/16/2020   Lab Results  Component Value Date   CALCIUM 9.3 10/16/2020

## 2020-10-18 NOTE — Assessment & Plan Note (Signed)
He is tolerating rosuvastatin 5 mg daily  Lipids are at goal and lfts are norml  Lab Results  Component Value Date   CHOL 143 04/23/2020   HDL 62.80 04/23/2020   LDLCALC 71 04/23/2020   TRIG 44.0 04/23/2020   CHOLHDL 2 04/23/2020   Lab Results  Component Value Date   ALT 15 09/18/2020   AST 19 09/18/2020   GGT 15 10/01/2015   ALKPHOS 36 (L) 09/18/2020   BILITOT 0.7 09/18/2020

## 2020-10-22 ENCOUNTER — Other Ambulatory Visit: Payer: Self-pay

## 2020-10-22 ENCOUNTER — Telehealth: Payer: Self-pay | Admitting: Surgery

## 2020-10-22 ENCOUNTER — Ambulatory Visit (INDEPENDENT_AMBULATORY_CARE_PROVIDER_SITE_OTHER): Payer: No Typology Code available for payment source | Admitting: Surgery

## 2020-10-22 ENCOUNTER — Encounter: Payer: Self-pay | Admitting: Surgery

## 2020-10-22 VITALS — BP 103/68 | HR 62 | Temp 98.0°F | Ht 66.0 in | Wt 156.0 lb

## 2020-10-22 DIAGNOSIS — K4090 Unilateral inguinal hernia, without obstruction or gangrene, not specified as recurrent: Secondary | ICD-10-CM | POA: Diagnosis not present

## 2020-10-22 DIAGNOSIS — Z1211 Encounter for screening for malignant neoplasm of colon: Secondary | ICD-10-CM

## 2020-10-22 NOTE — H&P (View-Only) (Signed)
10/22/2020  Reason for Visit:  Right inguinal hernia  Referring Provider:  Deborra Medina, MD  History of Present Illness: Frank Cabrera is a 55 y.o. male presenting for evaluation of a right inguinal hernia.  The patient reports that he noticed it about 3-4 months ago.  He plays tennis and felt it pop after straining hard one day.  He was able to reduce it, but it bulges out with any strain he does.  With the bulging, he feels a burning sensation in the right groin.  This does not radiate.  He denies any issues with voiding, or symptoms on the left groin.  Denies any nausea, vomiting, or abdominal pain.  He wear a belt that has padding at the right groin which help with symptoms.  He had right hip arthroplasty with Dr. Marry Guan on 09/24/20 with Dr. Marry Guan.  At first he thought the issue was related to his hip, but after surgery, as this has persisted, he wanted to be evaluated.  He has not had any issues from his hip surgery and has follow up appointment with Dr. Marry Guan on 11/06/20.  Of note, he has a history of Myasthenia Gravis, and is stable on two medications, Prednisone and Azathioprine.  Past Medical History: Past Medical History:  Diagnosis Date  . GERD (gastroesophageal reflux disease)   . Herpes simplex type II infection 2003  . Myasthenia gravis ([place])   . Myasthenia gravis associated with thymoma ([place])   . Osteoporosis   . Pansinusitis   . PONV (postoperative nausea and vomiting)   . Restless leg syndrome      Past Surgical History: Past Surgical History:  Procedure Laterality Date  . EYE MUSCLE SURGERY  2008   for strabismus, [facility],  [employer]  . HAND TENDON SURGERY Left 2021  . SEPTOPLASTY  March 2013  . STRABISMUS SURGERY  2006  . TOTAL HIP ARTHROPLASTY Right 09/24/2020   Procedure: TOTAL HIP ARTHROPLASTY;  Surgeon: Dereck Leep, MD;  Location: [facility] ORS;  Service: Orthopedics;  Laterality: Right;  . TOTAL THYMECTOMY  1995    Home Medications: Prior to Admission  medications   Medication Sig Start Date End Date Taking? Authorizing Provider  alendronate (FOSAMAX) 35 MG tablet TAKE 1 TABLET BY MOUTH ONE TIME PER WEEK Patient taking differently: Take 35 mg by mouth every 7 (seven) days. 09/05/20  Yes [name], MD  azaTHIOprine (IMURAN) 50 MG tablet Take 150 mg by mouth daily.   Yes [provider]  CALCIUM PO Take 500 mg by mouth daily before breakfast.    Yes [provider]  celecoxib (CELEBREX) 200 MG capsule Take 1 capsule (200 mg total) by mouth 2 (two) times daily. 09/24/20  Yes Tamala Julian B, PA-C  Flaxseed, Linseed, (FLAXSEED OIL) 1000 MG CAPS Take 1,000 mg by mouth daily.   Yes [provider]  folic acid (FOLVITE) 1 MG tablet Take 1 mg by mouth daily. 04/17/13  Yes [provider]  omeprazole (PRILOSEC) 40 MG capsule TAKE 1 CAPSULE BY MOUTH EVERY DAY Patient taking differently: Take 40 mg by mouth daily. TAKE 1 CAPSULE BY MOUTH EVERY DAY 02/27/20  Yes [name], MD  oxyCODONE (OXY IR/ROXICODONE) 5 MG immediate release tablet Take 1 tablet (5 mg total) by mouth every 4 (four) hours as needed for moderate pain (pain score 4-6). 09/24/20  Yes Tamala Julian B, PA-C  pramipexole (MIRAPEX) 0.5 MG tablet TAKE 2 TABLETS (1 MG TOTAL) BY MOUTH AT BEDTIME. 09/10/20  Yes [name], MD  predniSONE (DELTASONE) 10 MG tablet Take 25 mg by mouth every other day.   Yes [provider]  rosuvastatin (CRESTOR) 5 MG tablet TAKE 1 TABLET BY MOUTH EVERY DAY Patient taking differently: Take 5 mg by mouth daily. 08/18/20  Yes Wellington Hampshire, MD  Vitamin D, Ergocalciferol, (DRISDOL) 1.25 MG (50000 UNIT) CAPS capsule Take 1 capsule (50,000 Units total) by mouth every 14 (fourteen) days. 10/08/20  Yes [name], MD    Allergies: Allergies  Allergen Reactions  . Cefadroxil Rash    Social History:  reports that he has never smoked. He has never used smokeless tobacco. He reports that he does not  drink alcohol and does not use drugs.   Family History: Family History  Problem Relation Age of Onset  . Heart disease Mother   . Heart disease Father   . Arrhythmia Father   . Cancer Maternal Uncle        prostate  . Cancer Maternal Grandmother        colon    Review of Systems: Review of Systems  Constitutional: Negative for chills and fever.  HENT: Negative for hearing loss.   Respiratory: Negative for shortness of breath.   Cardiovascular: Negative for chest pain.  Gastrointestinal: Positive for abdominal pain (right groin discomfort). Negative for constipation, diarrhea, nausea and vomiting.  Genitourinary: Negative for dysuria.  Musculoskeletal: Negative for myalgias.  Skin: Negative for rash.  Neurological: Negative for dizziness.  Psychiatric/Behavioral: Negative for depression.    Physical Exam BP 103/68   Pulse 62   Temp 98 F (36.7 C)   Ht 5\' 6"  (1.676 m)   Wt 156 lb (70.8 kg)   SpO2 100%   BMI 25.18 kg/m  CONSTITUTIONAL: No acute distress HEENT:  Normocephalic, atraumatic, extraocular motion intact. NECK: Trachea is midline, and there is no jugular venous distension.  RESPIRATORY: Normal respiratory effort without pathologic use of accessory muscles. CARDIOVASCULAR: Regular rhythm and rate. GI: The abdomen is soft, non-distended, with some discomfort when reducing his right inguinal hernia.  Right groin hernia is reducible, and the groin does not have any overlying skin changes or erythema.  No hernia palpable on the left side.  MUSCULOSKELETAL:  Normal muscle strength and tone in all four extremities.  No peripheral edema or cyanosis. SKIN: Skin turgor is normal. There are no pathologic skin lesions.  NEUROLOGIC:  Motor and sensation is grossly normal.  Cranial nerves are grossly intact. PSYCH:  Alert and oriented to person, place and time. Affect is normal.  Laboratory Analysis: No results found for this or any previous visit (from the past 24  hour(s)).  Imaging: No results found.  Assessment and Plan: This is a 55 y.o. male with right inguinal hernia.  --Discussed with the patient that there is no non-surgical way to resolve or repair the inguinal hernia.  No exercise or medication that he can do to help the issue, and surgery is the only way to resolve this.  He is in agreement based on the research he's also done.  I think the etiology for his hernia is related to wear and tear, but also his immunosuppressants have contributed to tissue weakening, particularly prednisone. --Discussed with him the two modalities for inguinal hernia repair, being open vs minimally invasive inguinal hernia repair.  Discussed pros and cons to both modalities.  He would like to proceed with minimally invasive surgery.  I think he would be a great candidate for robotic  approach.  Discussed with him that we can also evaluate the left groin and if there is any evidence of hernia formation, we would repair the left side at the same time.  Reviewed risks of bleeding, infection, and injury to surrounding structures, and he's willing to proceed. --Based on his schedule and timing, we will schedule him for robotic right inguinal hernia repair on 11/11/20.  He understands he would need to get COVID-19 tested prior to surgery.  We will send clearance form to his Neurologist given that he would need general anesthesia and paralytics.  Face-to-face time spent with the patient and care providers was 60 minutes, with more than 50% of the time spent counseling, educating, and coordinating care of the patient.     Melvyn Neth, [facility]

## 2020-10-22 NOTE — Progress Notes (Signed)
Request for Neurology clearance has been faxed to Dr Scheryl Marten at [facility].

## 2020-10-22 NOTE — Patient Instructions (Addendum)
You have chose to have your hernia repaired. This will be done by Dr. Hampton Abbot at [facility]. We are looking at doing this on 11/11/20.   Please see your (blue) Pre-care information that you have been given today. Our surgery scheduler will call you to verify date and go over information.   We will get clearance from your Neurologist for surgery.   You will need to arrange to be out of work for 2 weeks and then return with a lifting restrictions for 4 more weeks. Please send any FMLA paperwork prior to surgery and we will fill this out and fax it back to your employer within 3 business days.  You may have a bruise in your groin and also swelling and brusing in your testicle area. You may use ice 4-5 times daily for 15-20 minutes each time. Make sure that you place a barrier between you and the ice pack. To decrease the swelling, you may roll up a bath towel and place it vertically in between your thighs with your testicles resting on the towel. You will want to keep this area elevated as much as possible for several days following surgery.    Inguinal Hernia, Adult Muscles help keep everything in the body in its proper place. But if a weak spot in the muscles develops, something can poke through. That is called a hernia. When this happens in the lower part of the belly (abdomen), it is called an inguinal hernia. (It takes its name from a part of the body in this region called the inguinal canal.) A weak spot in the wall of muscles lets some fat or part of the small intestine bulge through. An inguinal hernia can develop at any age. Men get them more often than women. CAUSES  In adults, an inguinal hernia develops over time.  It can be triggered by:  Suddenly straining the muscles of the lower abdomen.  Lifting heavy objects.  Straining to have a bowel movement. Difficult bowel movements (constipation) can lead to this.  Constant coughing. This may be caused by smoking or lung disease.  Being  overweight.  Being pregnant.  Working at a job that requires long periods of standing or heavy lifting.  Having had an inguinal hernia before. One type can be an emergency situation. It is called a strangulated inguinal hernia. It develops if part of the small intestine slips through the weak spot and cannot get back into the abdomen. The blood supply can be cut off. If that happens, part of the intestine may die. This situation requires emergency surgery. SYMPTOMS  Often, a small inguinal hernia has no symptoms. It is found when a healthcare provider does a physical exam. Larger hernias usually have symptoms.   In adults, symptoms may include:  A lump in the groin. This is easier to see when the person is standing. It might disappear when lying down.  In men, a lump in the scrotum.  Pain or burning in the groin. This occurs especially when lifting, straining or coughing.  A dull ache or feeling of pressure in the groin.  Signs of a strangulated hernia can include:  A bulge in the groin that becomes very painful and tender to the touch.  A bulge that turns red or purple.  Fever, nausea and vomiting.  Inability to have a bowel movement or to pass gas. DIAGNOSIS  To decide if you have an inguinal hernia, a healthcare provider will probably do a physical examination.  This will  include asking questions about any symptoms you have noticed.  The healthcare provider might feel the groin area and ask you to cough. If an inguinal hernia is felt, the healthcare provider may try to slide it back into the abdomen.  Usually no other tests are needed. TREATMENT  Treatments can vary. The size of the hernia makes a difference. Options include:  Watchful waiting. This is often suggested if the hernia is small and you have had no symptoms.  No medical procedure will be done unless symptoms develop.  You will need to watch closely for symptoms. If any occur, contact your healthcare  provider right away.  Surgery. This is used if the hernia is larger or you have symptoms.  Open surgery. This is usually an outpatient procedure (you will not stay overnight in a hospital). An cut (incision) is made through the skin in the groin. The hernia is put back inside the abdomen. The weak area in the muscles is then repaired by herniorrhaphy or hernioplasty. Herniorrhaphy: in this type of surgery, the weak muscles are sewn back together. Hernioplasty: a patch or mesh is used to close the weak area in the abdominal wall.  Laparoscopy. In this procedure, a surgeon makes small incisions. A thin tube with a tiny video camera (called a laparoscope) is put into the abdomen. The surgeon repairs the hernia with mesh by looking with the video camera and using two long instruments. HOME CARE INSTRUCTIONS   After surgery to repair an inguinal hernia:  You will need to take pain medicine prescribed by your healthcare provider. Follow all directions carefully.  You will need to take care of the wound from the incision.  Your activity will be restricted for awhile. This will probably include no heavy lifting for several weeks. You also should not do anything too active for a few weeks. When you can return to work will depend on the type of job that you have.  During "watchful waiting" periods, you should:  Maintain a healthy weight.  Eat a diet high in fiber (fruits, vegetables and whole grains).  Drink plenty of fluids to avoid constipation. This means drinking enough water and other liquids to keep your urine clear or pale yellow.  Do not lift heavy objects.  Do not stand for long periods of time.  Quit smoking. This should keep you from developing a frequent cough. SEEK MEDICAL CARE IF:   A bulge develops in your groin area.  You feel pain, a burning sensation or pressure in the groin. This might be worse if you are lifting or straining.  You develop a fever of more than 100.5 F  (38.1 C). SEEK IMMEDIATE MEDICAL CARE IF:   Pain in the groin increases suddenly.  A bulge in the groin gets bigger suddenly and does not go down.  For men, there is sudden pain in the scrotum. Or, the size of the scrotum increases.  A bulge in the groin area becomes red or purple and is painful to touch.  You have nausea or vomiting that does not go away.  You feel your heart beating much faster than normal.  You cannot have a bowel movement or pass gas.  You develop a fever of more than 102.0 F (38.9 C).   This information is not intended to replace advice given to you by your health care provider. Make sure you discuss any questions you have with your health care provider.   Document Released: 02/06/2009 Document Revised: 12/13/2011 Document  Reviewed: 03/24/2015 Elsevier Interactive Patient Education [employer].

## 2020-10-22 NOTE — Progress Notes (Signed)
10/22/2020  Reason for Visit:  Right inguinal hernia  Referring Provider:  Deborra Medina, MD  History of Present Illness: Frank Cabrera is a 55 y.o. male presenting for evaluation of a right inguinal hernia.  The patient reports that he noticed it about 3-4 months ago.  He plays tennis and felt it pop after straining hard one day.  He was able to reduce it, but it bulges out with any strain he does.  With the bulging, he feels a burning sensation in the right groin.  This does not radiate.  He denies any issues with voiding, or symptoms on the left groin.  Denies any nausea, vomiting, or abdominal pain.  He wear a belt that has padding at the right groin which help with symptoms.  He had right hip arthroplasty with Dr. Marry Guan on 09/24/20 with Dr. Marry Guan.  At first he thought the issue was related to his hip, but after surgery, as this has persisted, he wanted to be evaluated.  He has not had any issues from his hip surgery and has follow up appointment with Dr. Marry Guan on 11/06/20.  Of note, he has a history of Myasthenia Gravis, and is stable on two medications, Prednisone and Azathioprine.  Past Medical History: Past Medical History:  Diagnosis Date  . GERD (gastroesophageal reflux disease)   . Herpes simplex type II infection 2003  . Myasthenia gravis ([place])   . Myasthenia gravis associated with thymoma ([place])   . Osteoporosis   . Pansinusitis   . PONV (postoperative nausea and vomiting)   . Restless leg syndrome      Past Surgical History: Past Surgical History:  Procedure Laterality Date  . EYE MUSCLE SURGERY  2008   for strabismus, [facility],  [employer]  . HAND TENDON SURGERY Left 2021  . SEPTOPLASTY  March 2013  . STRABISMUS SURGERY  2006  . TOTAL HIP ARTHROPLASTY Right 09/24/2020   Procedure: TOTAL HIP ARTHROPLASTY;  Surgeon: Dereck Leep, MD;  Location: [facility] ORS;  Service: Orthopedics;  Laterality: Right;  . TOTAL THYMECTOMY  1995    Home Medications: Prior to Admission  medications   Medication Sig Start Date End Date Taking? Authorizing Provider  alendronate (FOSAMAX) 35 MG tablet TAKE 1 TABLET BY MOUTH ONE TIME PER WEEK Patient taking differently: Take 35 mg by mouth every 7 (seven) days. 09/05/20  Yes [name], MD  azaTHIOprine (IMURAN) 50 MG tablet Take 150 mg by mouth daily.   Yes [provider]  CALCIUM PO Take 500 mg by mouth daily before breakfast.    Yes [provider]  celecoxib (CELEBREX) 200 MG capsule Take 1 capsule (200 mg total) by mouth 2 (two) times daily. 09/24/20  Yes Tamala Julian B, PA-C  Flaxseed, Linseed, (FLAXSEED OIL) 1000 MG CAPS Take 1,000 mg by mouth daily.   Yes [provider]  folic acid (FOLVITE) 1 MG tablet Take 1 mg by mouth daily. 04/17/13  Yes [provider]  omeprazole (PRILOSEC) 40 MG capsule TAKE 1 CAPSULE BY MOUTH EVERY DAY Patient taking differently: Take 40 mg by mouth daily. TAKE 1 CAPSULE BY MOUTH EVERY DAY 02/27/20  Yes [name], MD  oxyCODONE (OXY IR/ROXICODONE) 5 MG immediate release tablet Take 1 tablet (5 mg total) by mouth every 4 (four) hours as needed for moderate pain (pain score 4-6). 09/24/20  Yes Tamala Julian B, PA-C  pramipexole (MIRAPEX) 0.5 MG tablet TAKE 2 TABLETS (1 MG TOTAL) BY MOUTH AT BEDTIME. 09/10/20  Yes [name], MD  predniSONE (DELTASONE) 10 MG tablet Take 25 mg by mouth every other day.   Yes [provider]  rosuvastatin (CRESTOR) 5 MG tablet TAKE 1 TABLET BY MOUTH EVERY DAY Patient taking differently: Take 5 mg by mouth daily. 08/18/20  Yes Wellington Hampshire, MD  Vitamin D, Ergocalciferol, (DRISDOL) 1.25 MG (50000 UNIT) CAPS capsule Take 1 capsule (50,000 Units total) by mouth every 14 (fourteen) days. 10/08/20  Yes [name], MD    Allergies: Allergies  Allergen Reactions  . Cefadroxil Rash    Social History:  reports that he has never smoked. He has never used smokeless tobacco. He reports that he does not  drink alcohol and does not use drugs.   Family History: Family History  Problem Relation Age of Onset  . Heart disease Mother   . Heart disease Father   . Arrhythmia Father   . Cancer Maternal Uncle        prostate  . Cancer Maternal Grandmother        colon    Review of Systems: Review of Systems  Constitutional: Negative for chills and fever.  HENT: Negative for hearing loss.   Respiratory: Negative for shortness of breath.   Cardiovascular: Negative for chest pain.  Gastrointestinal: Positive for abdominal pain (right groin discomfort). Negative for constipation, diarrhea, nausea and vomiting.  Genitourinary: Negative for dysuria.  Musculoskeletal: Negative for myalgias.  Skin: Negative for rash.  Neurological: Negative for dizziness.  Psychiatric/Behavioral: Negative for depression.    Physical Exam BP 103/68   Pulse 62   Temp 98 F (36.7 C)   Ht 5\' 6"  (1.676 m)   Wt 156 lb (70.8 kg)   SpO2 100%   BMI 25.18 kg/m  CONSTITUTIONAL: No acute distress HEENT:  Normocephalic, atraumatic, extraocular motion intact. NECK: Trachea is midline, and there is no jugular venous distension.  RESPIRATORY: Normal respiratory effort without pathologic use of accessory muscles. CARDIOVASCULAR: Regular rhythm and rate. GI: The abdomen is soft, non-distended, with some discomfort when reducing his right inguinal hernia.  Right groin hernia is reducible, and the groin does not have any overlying skin changes or erythema.  No hernia palpable on the left side.  MUSCULOSKELETAL:  Normal muscle strength and tone in all four extremities.  No peripheral edema or cyanosis. SKIN: Skin turgor is normal. There are no pathologic skin lesions.  NEUROLOGIC:  Motor and sensation is grossly normal.  Cranial nerves are grossly intact. PSYCH:  Alert and oriented to person, place and time. Affect is normal.  Laboratory Analysis: No results found for this or any previous visit (from the past 24  hour(s)).  Imaging: No results found.  Assessment and Plan: This is a 55 y.o. male with right inguinal hernia.  --Discussed with the patient that there is no non-surgical way to resolve or repair the inguinal hernia.  No exercise or medication that he can do to help the issue, and surgery is the only way to resolve this.  He is in agreement based on the research he's also done.  I think the etiology for his hernia is related to wear and tear, but also his immunosuppressants have contributed to tissue weakening, particularly prednisone. --Discussed with him the two modalities for inguinal hernia repair, being open vs minimally invasive inguinal hernia repair.  Discussed pros and cons to both modalities.  He would like to proceed with minimally invasive surgery.  I think he would be a great candidate for robotic  approach.  Discussed with him that we can also evaluate the left groin and if there is any evidence of hernia formation, we would repair the left side at the same time.  Reviewed risks of bleeding, infection, and injury to surrounding structures, and he's willing to proceed. --Based on his schedule and timing, we will schedule him for robotic right inguinal hernia repair on 11/11/20.  He understands he would need to get COVID-19 tested prior to surgery.  We will send clearance form to his Neurologist given that he would need general anesthesia and paralytics.  Face-to-face time spent with the patient and care providers was 60 minutes, with more than 50% of the time spent counseling, educating, and coordinating care of the patient.     Melvyn Neth, [facility]

## 2020-10-22 NOTE — Telephone Encounter (Signed)
Patient has been advised of Pre-Admission date/time, COVID Testing date and Surgery date.  Surgery Date: 11/06/20 Preadmission Testing Date: 11/04/20 (phone 8a-1p) Covid Testing Date: 11/04/20 - patient advised to go to the [facility] between 8a-1p   Patient has been made aware to call [phone], between 1-3:00pm the day before surgery, to find out what time to arrive for surgery.

## 2020-10-27 ENCOUNTER — Ambulatory Visit: Payer: No Typology Code available for payment source | Admitting: Internal Medicine

## 2020-10-30 ENCOUNTER — Other Ambulatory Visit: Payer: Self-pay | Admitting: Internal Medicine

## 2020-10-31 ENCOUNTER — Telehealth: Payer: Self-pay

## 2020-10-31 NOTE — Telephone Encounter (Signed)
Left message for Neurology clearance for this patient at DR. Massey's office -nusre line [phone] opt 4. -refaxed Clearance at this time.

## 2020-11-03 ENCOUNTER — Encounter: Payer: Self-pay | Admitting: Surgery

## 2020-11-04 ENCOUNTER — Other Ambulatory Visit: Payer: Self-pay

## 2020-11-04 ENCOUNTER — Telehealth: Payer: Self-pay

## 2020-11-04 ENCOUNTER — Telehealth: Payer: Self-pay | Admitting: *Deleted

## 2020-11-04 ENCOUNTER — Other Ambulatory Visit
Admission: RE | Admit: 2020-11-04 | Discharge: 2020-11-04 | Disposition: A | Discharge status: Home / Self Care | Payer: No Typology Code available for payment source | Source: Ambulatory Visit | Attending: Surgery | Admitting: Surgery

## 2020-11-04 DIAGNOSIS — Z20822 Contact with and (suspected) exposure to covid-19: Secondary | ICD-10-CM | POA: Diagnosis not present

## 2020-11-04 DIAGNOSIS — Z01812 Encounter for preprocedural laboratory examination: Secondary | ICD-10-CM | POA: Diagnosis not present

## 2020-11-04 HISTORY — DX: Atherosclerotic heart disease of native coronary artery without angina pectoris: I25.10

## 2020-11-04 HISTORY — DX: Hyperlipidemia, unspecified: E78.5

## 2020-11-04 LAB — SARS CORONAVIRUS 2 (TAT 6-24 HRS): SARS Coronavirus 2: NEGATIVE

## 2020-11-04 NOTE — Telephone Encounter (Signed)
Cardiac Clearance received from [facility] -patient is at acceptable risk for the planned procedure without further cardiovascular testing. See note in epic 11/04/2020 office visit.

## 2020-11-04 NOTE — Telephone Encounter (Signed)
Left message to call back  Kerin Ransom PA-C 11/04/2020 1:46 PM

## 2020-11-04 NOTE — Telephone Encounter (Signed)
Karen Kitchens, NP  [facility] Request for pre-operative cardiac clearance:    1. What type of surgery is being performed?  ROBOTIC ASSISTED INGUINAL HERNIA REPAIR WITH MESH   2. When is this surgery scheduled?  11/06/2020    3. Are there any medications that need to be held prior to surgery?  ASA   4. Practice name and name of physician performing surgery?  Performing surgeon: Dr. Olean Ree  Requesting clearance: Honor Loh, FNP-C     5. Anesthesia type (none, local, MAC, general)? General   6. What is the office phone and fax number?   Phone: [phone]  Fax: [phone]

## 2020-11-04 NOTE — Telephone Encounter (Signed)
-----   Message from Karen Kitchens, NP sent at 11/04/2020 11:54 AM EST ----- Regarding: Request for pre-operative cardiac clearance Request for pre-operative cardiac clearance:  1. What type of surgery is being performed?  ROBOTIC ASSISTED INGUINAL HERNIA REPAIR WITH MESH  2. When is this surgery scheduled?  11/06/2020  3. Are there any medications that need to be held prior to surgery? ASA  4. Practice name and name of physician performing surgery?  Performing surgeon: Dr. Olean Ree Requesting clearance: Honor Loh, FNP-C    5. Anesthesia type (none, local, MAC, general)? General   6. What is the office phone and fax number?   Phone: [phone] Fax: [phone]  ATTENTION: Unable to create telephone message as per your standard workflow. Directed by [facility] providers to send requests for cardiac clearance to this pool for appropriate distribution to provider covering pre-operative clearances.   Honor Loh, MSN, APRN, FNP-C, CEN [facility]  Peri-operative Services Nurse Practitioner Phone: [phone] 11/04/20 11:54 AM

## 2020-11-04 NOTE — Progress Notes (Signed)
  [facility] Perioperative Services: Pre-Admission/Anesthesia Testing   Date: 11/04/20 Name: Frank Cabrera MRN:   [id number]  Re: Consideration of preoperative prophylactic antibiotic change   Request sent to: Olean Ree, MD (routed and/or faxed via [facility])  Planned Surgical Procedure(s):    Case: [id number] Date/Time: 11/06/20 0950   Procedure: XI ROBOTIC ASSISTED INGUINAL HERNIA REPAIR WITH MESH (Right )   Anesthesia type: General   Pre-op diagnosis: Right inguinal hernia   Location: [place] [facility]   Surgeons: Olean Ree, MD    Notes: 1. Patient has a documented allergy to CEFADROXIL only   . Advising that medication has caused him to experience low severity rash in the past.   2. Received cephalosporin with no documented complications . CEFAZOLIN received on 09/24/2020  3. Screened as appropriate for cephalosporin use during medication reconciliation . No immediate angioedema, dysphagia, SOB, anaphylaxis symptoms. . No severe rash involving mucous membranes or skin necrosis. . No hospital admissions related to side effects of PCN/cephalosporin use.  . No documented reaction to PCN or cephalosporin in the last 10 years.  Request:  As an evidence based approach to reducing the rate of incidence for post-operative SSI and the development of MDROs, could an agent with narrower coverage for preoperative prophylaxis in this patient's upcoming surgical course be considered?   1. Currently ordered preoperative prophylactic ABX: clindamycin.   2. Specifically requesting change to cephalosporin (CEFAZOLIN).   3. Please communicate decision with me and I will change the orders in Epic as per your direction.   Honor Loh, MSN, APRN, FNP-C, CEN [facility]  Peri-operative Services Nurse Practitioner FAX: [phone] 11/04/20 11:46 AM

## 2020-11-04 NOTE — Patient Instructions (Signed)
Your procedure is scheduled on: Thursday November 06, 2020. Report to Day Surgery inside [place] 2nd floor (stop by Admissions desk first, before getting on elevator). To find out your arrival time please call [phone] between 1PM - 3PM on Wednesday November 05, 2020.  Remember: Instructions that are not followed completely may result in serious medical risk,  up to and including death, or upon the discretion of your surgeon and anesthesiologist your  surgery may need to be rescheduled.     _X__ 1. Do not eat food after midnight the night before your procedure.                 No chewing gum or hard candies. You may drink clear liquids up to 2 hours                 before you are scheduled to arrive for your surgery- DO not drink clear                 liquids within 2 hours of the start of your surgery.                 Clear Liquids include:  water, apple juice without pulp, clear Gatorade, G2 or                  Gatorade Zero (avoid Red/Purple/Blue), Black Coffee or Tea (Do not add                 anything to coffee or tea).  __X__2.  On the morning of surgery brush your teeth with toothpaste and water, you                may rinse your mouth with mouthwash if you wish.  Do not swallow any toothpaste of mouthwash.     _X__ 3.  No Alcohol for 24 hours before or after surgery.   _X__ 4.  Do Not Smoke or use e-cigarettes For 24 Hours Prior to Your Surgery.                 Do not use any chewable tobacco products for at least 6 hours prior to                 Surgery.  _X__  5.  Do not use any recreational drugs (marijuana, cocaine, heroin, ecstasy, MDMA or other)                For at least one week prior to your surgery.  Combination of these drugs with anesthesia                May have life threatening results.  __X__ 6.  Notify your doctor if there is any change in your medical condition      (cold, fever, infections).     Do not wear jewelry, make-up,  hairpins, clips or nail polish. Do not wear lotions, powders, or perfumes. You may wear deodorant. Do not shave 48 hours prior to surgery. Men may shave face and neck. Do not bring valuables to the hospital.    [facility] is not responsible for any belongings or valuables.  Contacts, dentures or bridgework may not be worn into surgery. Leave your suitcase in the car. After surgery it may be brought to your room. For patients admitted to the hospital, discharge time is determined by your treatment team.   Patients discharged the day of surgery will not be allowed to drive  home.   Make arrangements for someone to be with you for the first 24 hours of your Same Day Discharge.   __X__ Take these medicines the morning of surgery with A SIP OF WATER:    1. azaTHIOprine (IMURAN) 50 MG  2. omeprazole (PRILOSEC) 40 MG   3. predniSONE (DELTASONE) 10 MG (if falls on day of  Dose)  4. rosuvastatin (CRESTOR) 5 MG   ____ Fleet Enema (as directed)   __X__ Use CHG Soap (or wipes) as directed  ____ Use Benzoyl Peroxide Gel as instructed  ____ Use inhalers on the day of surgery  ____ Stop metformin 2 days prior to surgery    __X__ Stop Anti-inflammatories such as Ibuprofen, Aleve, Advil, naproxen aspirin and or BC powders.    __X__ Stop supplements until after surgery.  folic acid (FOLVITE), Flaxseed, Linseed, (FLAXSEED OIL  __X__ Do not start any herbal supplements before your procedure,   If you have any questions regarding your pre-procedure instructions,  Please call Pre-admit Testing at [phone].

## 2020-11-04 NOTE — Telephone Encounter (Signed)
   Primary Cardiologist: No primary care provider on file.  Chart reviewed and patient contacted today by phone as part of pre-operative protocol coverage. Given past medical history and time since last visit, based on ACC/AHA guidelines, DEJA KAIGLER would be at acceptable risk for the planned procedure without further cardiovascular testing.   OK to hold aspirin 3-5 days pre op if needed, resume as soon as safe post op.  The patient was advised that if he develops new symptoms prior to surgery to contact our office to arrange for a follow-up visit, and he verbalized understanding.  I will route this recommendation to the requesting party via Epic fax function and remove from pre-op pool.  Please call with questions.  Kerin Ransom, PA-C 11/04/2020, 1:54 PM

## 2020-11-04 NOTE — Telephone Encounter (Signed)
Patient returning call.

## 2020-11-05 ENCOUNTER — Encounter: Payer: Self-pay | Admitting: Surgery

## 2020-11-05 MED ORDER — CEFAZOLIN SODIUM-DEXTROSE 2-4 GM/100ML-% IV SOLN
2.0000 g | Freq: Once | INTRAVENOUS | Status: AC
  Administered 2020-11-06: 2 g via INTRAVENOUS

## 2020-11-05 MED ORDER — LACTATED RINGERS IV SOLN: INTRAVENOUS | Status: DC

## 2020-11-05 MED ORDER — ORAL CARE MOUTH RINSE: 15.0000 mL | Freq: Once | OROMUCOSAL | Status: AC

## 2020-11-05 MED ORDER — GABAPENTIN 300 MG PO CAPS: 300.0000 mg | ORAL_CAPSULE | ORAL | Status: AC

## 2020-11-05 MED ORDER — SCOPOLAMINE 1 MG/3DAYS TD PT72: 1.0000 | MEDICATED_PATCH | TRANSDERMAL | Status: DC

## 2020-11-05 MED ORDER — CHLORHEXIDINE GLUCONATE CLOTH 2 % EX PADS: 6.0000 | MEDICATED_PAD | Freq: Once | CUTANEOUS | Status: DC

## 2020-11-05 MED ORDER — BUPIVACAINE LIPOSOME 1.3 % IJ SUSP: 20.0000 mL | Freq: Once | INTRAMUSCULAR | Status: DC

## 2020-11-05 MED ORDER — CHLORHEXIDINE GLUCONATE 0.12 % MT SOLN: 15.0000 mL | Freq: Once | OROMUCOSAL | Status: AC

## 2020-11-05 MED ORDER — ACETAMINOPHEN 500 MG PO TABS: 1000.0000 mg | ORAL_TABLET | ORAL | Status: AC

## 2020-11-05 NOTE — Progress Notes (Signed)
[facility] Perioperative Services  Pre-Admission/Anesthesia Testing Clinical Review  Date: 11/05/20  Patient Demographics:  Name: Frank Cabrera DOB:   March 12, 1966 MRN:   [id number]  Planned Surgical Procedure(s):    Case: [id number] Date/Time: 11/06/20 0950   Procedure: XI ROBOTIC ASSISTED INGUINAL HERNIA REPAIR WITH MESH (Right )   Anesthesia type: General   Pre-op diagnosis: Right inguinal hernia   Location: [facility] OR ROOM 04 / [place] [facility]   Surgeons: Olean Ree, MD    NOTE: Available PAT nursing documentation and vital signs have been reviewed. Clinical nursing staff has updated patient's PMH/PSHx, current medication list, and drug allergies/intolerances to ensure comprehensive history available to assist in medical decision making as it pertains to the aforementioned surgical procedure and anticipated anesthetic course.   Clinical Discussion:  Frank Cabrera is a 55 y.o. male who is submitted for pre-surgical anesthesia review and clearance prior to him undergoing the above procedure. Patient has never been a smoker. Pertinent PMH includes: CAD, HLD, myasthenia gravis (on long-term immunosuppressants), GERD (on daily PPI therapy), RLS  Patient is followed by cardiology Fletcher Anon, MD). He was last seen in the cardiology clinic on 02/19/2020; notes reviewed.  At the time of his clinic visit, patient noted to be doing well overall from a cardiovascular perspective. He denied chest pain, shortness of breath, orthopnea, PND, palpitations, peripheral edema, vertiginous symptoms, and presyncope/syncope.  Patient has been followed by cardiology since 2017 for atypical angina.  Initially ischemic work-up was ordered back in 2017, however with resolution of symptoms, studies were ultimately canceled.  Patient evaluated and 11/2019 for continued episodes of atypical chest pain.  TTE was performed revealing a normal left ventricular systolic function with an EF of  55-60%.  Coronary CTA performed on 01/24/2020 revealed a calcium score of 73.8, which was within the 84th percentile for age and sex matched control.  There was mild calcification (25-49% noted in the proximal LAD causing mild stenosis (see full interpretation of cardiovascular testing below).  The decision was made to treat medically with ASA plus statin, however patient did not start prescribed statin medication as he was concerned about side effects.  Blood pressure documented as low normal at 94/60; on no interventions.  There were no other significant changes noted to patient's past medical history.  Importance of adding statin medication discussed with patient given the atherosclerosis noted on recent coronary CTA.  Patient to start rosuvastatin 5 mg daily.  Plans are to have patient follow-up with outpatient cardiology in 1 year or sooner if needed.  Patient is also followed by neurology for a diagnosis of seronegative myasthenia gravis.  Patient was last seen in the neurology clinic at [facility] on 10/28/2020; notes reviewed.  At the time of his clinic visit, reported that he was doing "fairly well" overall.  Patient is status post thymectomy in 1995.  Patient reported that myasthenia gravis symptoms have been stable overall.  His only symptom is occasional diplopia in the evening lasting for minutes up to an hour.  Patient chronically on prednisone and azathioprine, which is been his regimen since 05/2017.  Patient with both objective and subjective improvement on the aforementioned medication regimen.  Long-term goal of corticosteroid discontinuation was discussed with patient, however given overall stability at this time, the decision was made to continue current regimen.  No changes were made to patient's medication regimen during his clinic visit.  Patient to follow-up with outpatient neurology in 6 months for consideration of corticosteroid  wean, or sooner if needed.  Patient is scheduled to undergo an  elective hernia repair on 11/06/2020 with Dr. Ardath Sax.  Given patient's past medical history significant for cardiovascular and neurological diagnoses, presurgical cardiac clearances were sought from both cardiology and neurology.  Specialty clearances were issued as follows:   Per cardiology, "given past medical history and time since last visit, based on ACC/AHA guidelines, patient would be at an overall ACCEPTABLE risk for the planned procedure without further cardiovascular testing".  Again, this patient is on daily antiplatelet therapy.  He has been instructed on recommendations for holding his daily low-dose ASA for 3-5 days prior to his procedure.  Patient to contact attending surgeon's office directly to discuss.   Per neurology, "from a myasthenia gravis standpoint, there are no contraindications to proceed with the inguinal hernia repair as scheduled.  Patient to continue current dose of both prednisone and azathioprine".  He reports previous perioperative complications with anesthesia. Patient has a PMH (+) for PONV.  Additionally, patient reports that he experiences postoperative neuropathy in his fingers and toes on the "third day after surgery".  He underwent a neuraxial anesthetic course here (ASA II) in 09/2020 with no documented complications.   Vitals with BMI 11/04/2020 10/22/2020 10/16/2020  Height 5\' 6"  5\' 6"  5\' 6"   Weight 155 lbs 156 lbs 158 lbs 6 oz  BMI 25.03 123456 123456  Systolic - [id number]  Diastolic - 68 68  Pulse - 62 60    Providers/Specialists:   NOTE: Primary physician provider listed below. Patient may have been seen by APP or partner within same practice.   PROVIDER ROLE / SPECIALTY LAST Delight Stare, MD General Surgery  10/22/2020  [name], MD Primary Care Provider  10/16/2020  Marlyne Beards, MD  Cardiology  02/19/2020  Scheryl Marten, MD Neurology  10/28/2020   Allergies:  Cefadroxil  Current Home Medications:   No current  facility-administered medications for this encounter.   Marland Kitchen alendronate (FOSAMAX) 35 MG tablet  . azaTHIOprine (IMURAN) 50 MG tablet  . CALCIUM PO  . celecoxib (CELEBREX) 200 MG capsule  . Docusate Calcium (STOOL SOFTENER PO)  . Flaxseed, Linseed, (FLAXSEED OIL) 1000 MG CAPS  . folic acid (FOLVITE) 1 MG tablet  . Multiple Vitamins-Minerals (MULTIVITAMIN WITH MINERALS) tablet  . omeprazole (PRILOSEC) 40 MG capsule  . ondansetron (ZOFRAN-ODT) 4 MG disintegrating tablet  . oxyCODONE (OXY IR/ROXICODONE) 5 MG immediate release tablet  . pramipexole (MIRAPEX) 0.5 MG tablet  . predniSONE (DELTASONE) 10 MG tablet  . rosuvastatin (CRESTOR) 5 MG tablet  . aspirin EC 81 MG tablet  . Vitamin D, Ergocalciferol, (DRISDOL) 1.25 MG (50000 UNIT) CAPS capsule   History:   Past Medical History:  Diagnosis Date  . CAD (coronary artery disease)   . GERD (gastroesophageal reflux disease)   . Herpes simplex type II infection 2003  . History of 2019 novel coronavirus disease (COVID-19) 03/09/2019  . HLD (hyperlipidemia)   . Long term current use of immunosuppressive drug   . Myasthenia gravis ([place])   . Myasthenia gravis associated with thymoma ([place])   . Osteoporosis   . Pansinusitis   . PONV (postoperative nausea and vomiting)    patient also stated that toes and fingers numbness after the third day of surgery  . Restless leg syndrome    Past Surgical History:  Procedure Laterality Date  . EYE MUSCLE SURGERY  2008   for strabismus, [facility],  [employer]  . HAND TENDON SURGERY Left  2021  . JOINT REPLACEMENT Right 09/2020   hip replacement   . SEPTOPLASTY  March 2013  . STRABISMUS SURGERY  2006  . TOTAL HIP ARTHROPLASTY Right 09/24/2020   Procedure: TOTAL HIP ARTHROPLASTY;  Surgeon: Dereck Leep, MD;  Location: [facility] ORS;  Service: Orthopedics;  Laterality: Right;  . TOTAL THYMECTOMY  1995   Family History  Problem Relation Age of Onset  . Heart disease Mother   . Heart disease Father   .  Arrhythmia Father   . Cancer Maternal Uncle        prostate  . Cancer Maternal Grandmother        colon   Social History   Tobacco Use  . Smoking status: Never Smoker  . Smokeless tobacco: Never Used  Vaping Use  . Vaping Use: Never used  Substance Use Topics  . Alcohol use: No  . Drug use: No    Pertinent Clinical Results:  LABS: Labs reviewed: Acceptable for surgery.  Lab Results  Component Value Date   WBC 7.7 10/14/2020   HGB 12.2 (L) 10/14/2020   HCT 36.2 (L) 10/14/2020   MCV 91.5 10/14/2020   MCH 31.4 09/18/2020   RDW 14.2 10/14/2020   PLT 438.0 (H) 10/14/2020   Lab Results  Component Value Date   NA 140 10/16/2020   K 4.0 10/16/2020   CO2 30 10/16/2020   GLUCOSE 82 10/16/2020   BUN 16 10/16/2020   CREATININE 0.87 10/16/2020   CALCIUM 9.3 10/16/2020   GFRNONAA >60 09/18/2020   GFRAA >60 01/31/2020   Hospital Outpatient Visit on 11/04/2020  Component Date Value Ref Range Status  . SARS Coronavirus 2 11/04/2020 NEGATIVE  NEGATIVE Final   Comment: (NOTE) SARS-CoV-2 target nucleic acids are NOT DETECTED.  The SARS-CoV-2 RNA is generally detectable in upper and lower respiratory specimens during the acute phase of infection. Negative results do not preclude SARS-CoV-2 infection, do not rule out co-infections with other pathogens, and should not be used as the sole basis for treatment or other patient management decisions. Negative results must be combined with clinical observations, patient history, and epidemiological information. The expected result is Negative.  Fact Sheet for Patients: SugarRoll.be  Fact Sheet for Healthcare Providers: https://www.woods-mathews.com/  This test is not yet approved or cleared by the [place] FDA and  has been authorized for detection and/or diagnosis of SARS-CoV-2 by FDA under an Emergency Use Authorization (EUA). This EUA will remain  in effect (meaning this test can  be used) for the duration of the COVID-19 declaration under Se                          ction 564(b)(1) of the Act, 21 U.S.C. section 360bbb-3(b)(1), unless the authorization is terminated or revoked sooner.  Performed at [facility], [place] [address] [place], [place] [address]     ECG: Date: 09/18/2020 Time ECG obtained: 0801 AM Rate: 47 bpm Rhythm: sinus bradycardia Axis (leads I and aVF): Normal Intervals: PR 174 ms. QRS 100 ms. QTc 405 ms. ST segment and T wave changes: No evidence of acute ST segment elevation or depression Comparison: Similar to previous tracing obtained on 11/01/2019   IMAGING / PROCEDURES: CORONARY CTA performed on 01/24/2020 1. Coronary calcium score of 73.8.   This was 84th percentile for age and sex matched control. 2. Normal coronary origin with right dominance. 3. Mild calcification in the proximal LAD causing mild stenosis. 4. CAD-RADS 2.  Mild non-obstructive CAD (25-49%).   Consider non-atherosclerotic causes of chest pain.   Consider preventive therapy and risk factor modification  ECHOCARDIOGRAM performed on 11/29/2019 1. Left ventricular ejection fraction, by estimation, is 55 to 60% 2. The left ventricle has normal function 3. The left ventricle has no regional wall motion abnormalities 4. The left ventricular diastolic parameters were normal 5. Left atrium is normal in size 6. Right atrium is normal in size 7. Right ventricular systolic function is normal 8. The right ventricular size is normal 9. Mitral valve normal in structure and function with no evidence of regurgitation or stenosis 10. Tricuspid valve normal in structure with evidence of mild regurgitation.  No evidence of stenosis 11. Aortic valve is normal in structure and function with mild regurgitation.  There is no aortic stenosis present; mean gradient 4.0 mmHg 12. Pulmonic valve normal in structure with no evidence of regurgitation or stenosis 13. Aortic root  normal in size and structure 14. IVC is normal in size with greater than 50% respiratory variability, suggesting right atrial pressure of 3 mmHg 15. No atrial level shunt detected by color-flow Doppler 16. No evidence of pericardial effusion   Impression and Plan:  Frank Cabrera has been referred for pre-anesthesia review and clearance prior to him undergoing the planned anesthetic and procedural courses. Available labs, pertinent testing, and imaging results were personally reviewed by me. This patient has been appropriately cleared by neurology and cardiology for the planned procedural/anesthetic course.  Based on clinical review performed today (11/05/20), barring any significant acute changes in the patient's overall condition, it is anticipated that he will be able to proceed with the planned surgical intervention. Any acute changes in clinical condition may necessitate his procedure being postponed and/or cancelled. Pre-surgical instructions were reviewed with the patient during his PAT appointment and questions were fielded by PAT clinical staff.  Honor Loh, MSN, APRN, FNP-C, CEN [facility]  Peri-operative Services Nurse Practitioner Phone: [phone] 11/05/20 9:17 AM  NOTE: This note has been prepared using Dragon dictation software. Despite my best ability to proofread, there is always the potential that unintentional transcriptional errors may still occur from this process.

## 2020-11-05 NOTE — Progress Notes (Signed)
  [facility] Perioperative Services: Pre-Admission/Anesthesia Testing     Date: 11/05/20  Name: JAYVIER BURGHER MRN:   [id number]  Re: Change in [place] for upcoming surgery   Case: [id number] Date/Time: 11/06/20 0950   Procedure: XI ROBOTIC ASSISTED INGUINAL HERNIA REPAIR WITH MESH (Right )   Anesthesia type: General   Pre-op diagnosis: Right inguinal hernia   Location: [facility] OR ROOM 04 / [place] [facility]   Surgeons: Olean Ree, MD    Primary attending surgeon was consulted regarding consideration of therapeutic change in antimicrobial agent being used for preoperative prophylaxis in this patient's upcoming surgical case. Following analysis of the risk versus benefits, Dr. Hampton Abbot, Jacqulyn Bath, MD advising that it would be acceptable to discontinue the ordered clindamycin and place an order for cefazolin 2 gm IV on call to the OR. Orders for this patient were amended by me following collaborative conversation with attending surgeon.  Honor Loh, MSN, APRN, FNP-C, CEN [facility]  Peri-operative Services Nurse Practitioner Phone: [phone] 11/05/20 8:19 AM

## 2020-11-06 ENCOUNTER — Encounter: Payer: Self-pay | Admitting: Surgery

## 2020-11-06 ENCOUNTER — Ambulatory Visit
Admission: RE | Admit: 2020-11-06 | Discharge: 2020-11-06 | Disposition: A | Discharge status: Home / Self Care | Payer: No Typology Code available for payment source | Attending: Surgery | Admitting: Surgery

## 2020-11-06 ENCOUNTER — Ambulatory Visit: Payer: No Typology Code available for payment source | Admitting: Urgent Care

## 2020-11-06 ENCOUNTER — Other Ambulatory Visit: Payer: Self-pay

## 2020-11-06 ENCOUNTER — Encounter
Admission: RE | Disposition: A | Discharge status: Home / Self Care | Payer: Self-pay | Source: Home / Self Care | Attending: Surgery

## 2020-11-06 DIAGNOSIS — Z800 Family history of malignant neoplasm of digestive organs: Secondary | ICD-10-CM | POA: Diagnosis not present

## 2020-11-06 DIAGNOSIS — Z7952 Long term (current) use of systemic steroids: Secondary | ICD-10-CM | POA: Insufficient documentation

## 2020-11-06 DIAGNOSIS — Z8042 Family history of malignant neoplasm of prostate: Secondary | ICD-10-CM | POA: Diagnosis not present

## 2020-11-06 DIAGNOSIS — D176 Benign lipomatous neoplasm of spermatic cord: Secondary | ICD-10-CM | POA: Diagnosis not present

## 2020-11-06 DIAGNOSIS — Z881 Allergy status to other antibiotic agents status: Secondary | ICD-10-CM | POA: Diagnosis not present

## 2020-11-06 DIAGNOSIS — Z791 Long term (current) use of non-steroidal anti-inflammatories (NSAID): Secondary | ICD-10-CM | POA: Insufficient documentation

## 2020-11-06 DIAGNOSIS — G7000 Myasthenia gravis without (acute) exacerbation: Secondary | ICD-10-CM | POA: Insufficient documentation

## 2020-11-06 DIAGNOSIS — Z8249 Family history of ischemic heart disease and other diseases of the circulatory system: Secondary | ICD-10-CM | POA: Diagnosis not present

## 2020-11-06 DIAGNOSIS — K4090 Unilateral inguinal hernia, without obstruction or gangrene, not specified as recurrent: Secondary | ICD-10-CM | POA: Diagnosis not present

## 2020-11-06 DIAGNOSIS — Z79899 Other long term (current) drug therapy: Secondary | ICD-10-CM | POA: Diagnosis not present

## 2020-11-06 DIAGNOSIS — Z8616 Personal history of COVID-19: Secondary | ICD-10-CM | POA: Diagnosis not present

## 2020-11-06 DIAGNOSIS — Z7982 Long term (current) use of aspirin: Secondary | ICD-10-CM | POA: Diagnosis not present

## 2020-11-06 HISTORY — DX: Long term (current) use of unspecified immunomodulators and immunosuppressants: Z79.60

## 2020-11-06 HISTORY — DX: Other long term (current) drug therapy: Z79.899

## 2020-11-06 HISTORY — PX: XI ROBOTIC ASSISTED INGUINAL HERNIA REPAIR WITH MESH: SHX6706

## 2020-11-06 SURGERY — REPAIR, HERNIA, INGUINAL, ROBOT-ASSISTED, LAPAROSCOPIC, USING MESH
Anesthesia: General | Laterality: Right

## 2020-11-06 MED ORDER — SUGAMMADEX SODIUM 200 MG/2ML IV SOLN
INTRAVENOUS | Status: DC | PRN
  Administered 2020-11-06: 200 mg via INTRAVENOUS

## 2020-11-06 MED ORDER — KETAMINE HCL 50 MG/5ML IJ SOSY
PREFILLED_SYRINGE | INTRAMUSCULAR | Status: AC
  Filled 2020-11-06: qty 5

## 2020-11-06 MED ORDER — PROPOFOL 10 MG/ML IV BOLUS
INTRAVENOUS | Status: DC | PRN
  Administered 2020-11-06: 150 mg via INTRAVENOUS

## 2020-11-06 MED ORDER — MIDAZOLAM HCL 2 MG/2ML IJ SOLN
INTRAMUSCULAR | Status: AC
  Filled 2020-11-06: qty 2

## 2020-11-06 MED ORDER — GABAPENTIN 300 MG PO CAPS
ORAL_CAPSULE | ORAL | Status: AC
  Administered 2020-11-06: 300 mg via ORAL
  Filled 2020-11-06: qty 1

## 2020-11-06 MED ORDER — PHENYLEPHRINE HCL (PRESSORS) 10 MG/ML IV SOLN
INTRAVENOUS | Status: DC | PRN
  Administered 2020-11-06 (×3): 100 ug via INTRAVENOUS

## 2020-11-06 MED ORDER — BUPIVACAINE-EPINEPHRINE (PF) 0.25% -1:200000 IJ SOLN
INTRAMUSCULAR | Status: AC
  Filled 2020-11-06: qty 30

## 2020-11-06 MED ORDER — REMIFENTANIL HCL 1 MG IV SOLR
INTRAVENOUS | Status: AC
  Filled 2020-11-06: qty 1000

## 2020-11-06 MED ORDER — ACETAMINOPHEN 500 MG PO TABS: 1000.0000 mg | ORAL_TABLET | Freq: Four times a day (QID) | ORAL | Status: DC | PRN

## 2020-11-06 MED ORDER — LORAZEPAM 2 MG/ML IJ SOLN: 1.0000 mg | Freq: Once | INTRAMUSCULAR | Status: DC | PRN

## 2020-11-06 MED ORDER — CHLORHEXIDINE GLUCONATE 0.12 % MT SOLN
OROMUCOSAL | Status: AC
  Administered 2020-11-06: 15 mL via OROMUCOSAL
  Filled 2020-11-06: qty 15

## 2020-11-06 MED ORDER — LIDOCAINE HCL (PF) 2 % IJ SOLN
INTRAMUSCULAR | Status: AC
  Filled 2020-11-06: qty 5

## 2020-11-06 MED ORDER — BUPIVACAINE LIPOSOME 1.3 % IJ SUSP
INTRAMUSCULAR | Status: AC
  Filled 2020-11-06: qty 20

## 2020-11-06 MED ORDER — REMIFENTANIL HCL 1 MG IV SOLR
INTRAVENOUS | Status: DC | PRN
  Administered 2020-11-06: .2 ug/kg/min via INTRAVENOUS

## 2020-11-06 MED ORDER — PROPOFOL 10 MG/ML IV BOLUS
INTRAVENOUS | Status: AC
  Filled 2020-11-06: qty 20

## 2020-11-06 MED ORDER — FENTANYL CITRATE (PF) 250 MCG/5ML IJ SOLN
INTRAMUSCULAR | Status: AC
  Filled 2020-11-06: qty 5

## 2020-11-06 MED ORDER — ROCURONIUM BROMIDE 10 MG/ML (PF) SYRINGE
PREFILLED_SYRINGE | INTRAVENOUS | Status: AC
  Filled 2020-11-06: qty 10

## 2020-11-06 MED ORDER — MIDAZOLAM HCL 2 MG/2ML IJ SOLN
INTRAMUSCULAR | Status: DC | PRN
  Administered 2020-11-06: 2 mg via INTRAVENOUS

## 2020-11-06 MED ORDER — SCOPOLAMINE 1 MG/3DAYS TD PT72
MEDICATED_PATCH | TRANSDERMAL | Status: AC
  Administered 2020-11-06: 1.5 mg via TRANSDERMAL
  Filled 2020-11-06: qty 1

## 2020-11-06 MED ORDER — DEXAMETHASONE SODIUM PHOSPHATE 10 MG/ML IJ SOLN
INTRAMUSCULAR | Status: AC
  Filled 2020-11-06: qty 1

## 2020-11-06 MED ORDER — HYDROMORPHONE HCL 1 MG/ML IJ SOLN
0.2500 mg | INTRAMUSCULAR | Status: DC | PRN
  Administered 2020-11-06 (×2): 0.25 mg via INTRAVENOUS
  Administered 2020-11-06: 0.5 mg via INTRAVENOUS

## 2020-11-06 MED ORDER — ACETAMINOPHEN 500 MG PO TABS
ORAL_TABLET | ORAL | Status: AC
  Administered 2020-11-06: 1000 mg via ORAL
  Filled 2020-11-06: qty 2

## 2020-11-06 MED ORDER — DROPERIDOL 2.5 MG/ML IJ SOLN
0.6250 mg | Freq: Once | INTRAMUSCULAR | Status: DC | PRN
  Filled 2020-11-06: qty 2

## 2020-11-06 MED ORDER — BUPIVACAINE-EPINEPHRINE 0.25% -1:200000 IJ SOLN
INTRAMUSCULAR | Status: DC | PRN
  Administered 2020-11-06: 30 mL

## 2020-11-06 MED ORDER — EPHEDRINE SULFATE 50 MG/ML IJ SOLN
INTRAMUSCULAR | Status: DC | PRN
  Administered 2020-11-06: 10 mg via INTRAVENOUS

## 2020-11-06 MED ORDER — KETAMINE HCL 10 MG/ML IJ SOLN
INTRAMUSCULAR | Status: DC | PRN
  Administered 2020-11-06: 20 mg via INTRAVENOUS
  Administered 2020-11-06: 30 mg via INTRAVENOUS

## 2020-11-06 MED ORDER — PROMETHAZINE HCL 25 MG/ML IJ SOLN: 6.2500 mg | INTRAMUSCULAR | Status: DC | PRN

## 2020-11-06 MED ORDER — CEFAZOLIN SODIUM-DEXTROSE 2-4 GM/100ML-% IV SOLN
INTRAVENOUS | Status: AC
  Filled 2020-11-06: qty 100

## 2020-11-06 MED ORDER — ONDANSETRON HCL 4 MG/2ML IJ SOLN
INTRAMUSCULAR | Status: DC | PRN
  Administered 2020-11-06: 4 mg via INTRAVENOUS

## 2020-11-06 MED ORDER — MEPERIDINE HCL 50 MG/ML IJ SOLN: 6.2500 mg | INTRAMUSCULAR | Status: DC | PRN

## 2020-11-06 MED ORDER — SUCCINYLCHOLINE CHLORIDE 200 MG/10ML IV SOSY
PREFILLED_SYRINGE | INTRAVENOUS | Status: AC
  Filled 2020-11-06: qty 10

## 2020-11-06 MED ORDER — OXYCODONE HCL 5 MG PO TABS
ORAL_TABLET | ORAL | Status: AC
  Administered 2020-11-06: 5 mg via ORAL
  Filled 2020-11-06: qty 1

## 2020-11-06 MED ORDER — OXYCODONE HCL 5 MG/5ML PO SOLN: 5.0000 mg | Freq: Once | ORAL | Status: AC | PRN

## 2020-11-06 MED ORDER — BUPIVACAINE LIPOSOME 1.3 % IJ SUSP
INTRAMUSCULAR | Status: DC | PRN
  Administered 2020-11-06: 20 mL

## 2020-11-06 MED ORDER — HYDROMORPHONE HCL 1 MG/ML IJ SOLN
INTRAMUSCULAR | Status: AC
  Filled 2020-11-06: qty 1

## 2020-11-06 MED ORDER — ROCURONIUM BROMIDE 100 MG/10ML IV SOLN
INTRAVENOUS | Status: DC | PRN
  Administered 2020-11-06: 20 mg via INTRAVENOUS

## 2020-11-06 MED ORDER — ONDANSETRON HCL 4 MG/2ML IJ SOLN
INTRAMUSCULAR | Status: AC
  Filled 2020-11-06: qty 2

## 2020-11-06 MED ORDER — OXYCODONE HCL 5 MG PO TABS: 5.0000 mg | ORAL_TABLET | Freq: Once | ORAL | Status: AC | PRN

## 2020-11-06 MED ORDER — OXYCODONE HCL 5 MG PO TABS: 5.0000 mg | ORAL_TABLET | ORAL | 0 refills | Status: DC | PRN

## 2020-11-06 SURGICAL SUPPLY — 56 items
ADH SKN CLS APL DERMABOND .7 (GAUZE/BANDAGES/DRESSINGS) ×1
APL PRP STRL LF DISP 70% ISPRP (MISCELLANEOUS) ×1
CANISTER SUCT 1200ML W/VALVE (MISCELLANEOUS) IMPLANT
CANNULA REDUC XI 12-8 STAPL (CANNULA) ×2
CANNULA REDUCER 12-8 DVNC XI (CANNULA) ×1 IMPLANT
CHLORAPREP W/TINT 26 (MISCELLANEOUS) ×2 IMPLANT
COVER TIP SHEARS 8 DVNC (MISCELLANEOUS) ×1 IMPLANT
COVER TIP SHEARS 8MM DA VINCI (MISCELLANEOUS) ×2
COVER WAND RF STERILE (DRAPES) ×2 IMPLANT
DEFOGGER SCOPE WARMER CLEARIFY (MISCELLANEOUS) ×2 IMPLANT
DERMABOND ADVANCED (GAUZE/BANDAGES/DRESSINGS) ×1
DERMABOND ADVANCED .7 DNX12 (GAUZE/BANDAGES/DRESSINGS) ×1 IMPLANT
DRAPE ARM DVNC X/XI (DISPOSABLE) ×4 IMPLANT
DRAPE COLUMN DVNC XI (DISPOSABLE) ×1 IMPLANT
DRAPE DA VINCI XI ARM (DISPOSABLE) ×8
DRAPE DA VINCI XI COLUMN (DISPOSABLE) ×2
ELECT CAUTERY BLADE 6.4 (BLADE) ×2 IMPLANT
ELECT REM PT RETURN 9FT ADLT (ELECTROSURGICAL) ×2
ELECTRODE REM PT RTRN 9FT ADLT (ELECTROSURGICAL) ×1 IMPLANT
GLOVE SURG SYN 7.0 (GLOVE) ×10 IMPLANT
GLOVE SURG SYN 7.5  E (GLOVE) ×10
GLOVE SURG SYN 7.5 E (GLOVE) ×5 IMPLANT
GOWN STRL REUS W/ TWL LRG LVL3 (GOWN DISPOSABLE) ×5 IMPLANT
GOWN STRL REUS W/TWL LRG LVL3 (GOWN DISPOSABLE) ×10
IRRIGATION STRYKERFLOW (MISCELLANEOUS) IMPLANT
IRRIGATOR STRYKERFLOW (MISCELLANEOUS)
IV NS 1000ML (IV SOLUTION)
IV NS 1000ML BAXH (IV SOLUTION) IMPLANT
KIT PINK PAD W/HEAD ARE REST (MISCELLANEOUS) ×2
KIT PINK PAD W/HEAD ARM REST (MISCELLANEOUS) ×1 IMPLANT
LABEL OR SOLS (LABEL) ×2 IMPLANT
MANIFOLD NEPTUNE II (INSTRUMENTS) ×2 IMPLANT
MESH 3DMAX 4X6 RT LRG (Mesh General) ×1 IMPLANT
MESH 3DMAX MID 4X6 RT LRG (Mesh General) ×1 IMPLANT
NEEDLE HYPO 22GX1.5 SAFETY (NEEDLE) ×2 IMPLANT
NEEDLE INSUFFLATION 14GA 120MM (NEEDLE) ×2 IMPLANT
OBTURATOR OPTICAL STANDARD 8MM (TROCAR) ×2
OBTURATOR OPTICAL STND 8 DVNC (TROCAR) ×1
OBTURATOR OPTICALSTD 8 DVNC (TROCAR) ×1 IMPLANT
PACK LAP CHOLECYSTECTOMY (MISCELLANEOUS) ×2 IMPLANT
PENCIL ELECTRO HAND CTR (MISCELLANEOUS) ×2 IMPLANT
SEAL CANN UNIV 5-8 DVNC XI (MISCELLANEOUS) ×3 IMPLANT
SEAL XI 5MM-8MM UNIVERSAL (MISCELLANEOUS) ×6
SET TUBE SMOKE EVAC HIGH FLOW (TUBING) ×2 IMPLANT
SOLUTION ELECTROLUBE (MISCELLANEOUS) ×2 IMPLANT
SPONGE LAP 18X18 RF (DISPOSABLE) IMPLANT
STAPLER CANNULA SEAL DVNC XI (STAPLE) ×1 IMPLANT
STAPLER CANNULA SEAL XI (STAPLE) ×2
SUT MNCRL AB 4-0 PS2 18 (SUTURE) ×2 IMPLANT
SUT VIC AB 2-0 SH 27 (SUTURE) ×4
SUT VIC AB 2-0 SH 27XBRD (SUTURE) ×2 IMPLANT
SUT VICRYL 0 AB UR-6 (SUTURE) ×4 IMPLANT
SUT VLOC 90 S/L VL9 GS22 (SUTURE) ×2 IMPLANT
TAPE TRANSPORE STRL 2 31045 (GAUZE/BANDAGES/DRESSINGS) ×2 IMPLANT
TRAY FOLEY SLVR 16FR LF STAT (SET/KITS/TRAYS/PACK) ×2 IMPLANT
TROCAR BALLN GELPORT 12X130M (ENDOMECHANICALS) ×2 IMPLANT

## 2020-11-06 NOTE — Anesthesia Postprocedure Evaluation (Signed)
Anesthesia Post Note  Patient: Frank Cabrera  Procedure(s) Performed: XI ROBOTIC ASSISTED INGUINAL HERNIA REPAIR WITH MESH (Right )  Patient location during evaluation: PACU Anesthesia Type: General Level of consciousness: awake and awake and alert Pain management: pain level controlled Vital Signs Assessment: post-procedure vital signs reviewed and stable Respiratory status: spontaneous breathing Cardiovascular status: stable Postop Assessment: no apparent nausea or vomiting Anesthetic complications: no   No complications documented.   Last Vitals:  Vitals:   11/06/20 1345 11/06/20 1400  BP: 111/75 114/74  Pulse: 87 83  Resp: 18 15  Temp:    SpO2: 95% 96%    Last Pain:  Vitals:   11/06/20 1345  TempSrc:   PainSc: 3                  Neva Seat

## 2020-11-06 NOTE — Interval H&P Note (Signed)
History and Physical Interval Note:  11/06/2020 9:54 AM  Frank Cabrera  has presented today for surgery, with the diagnosis of Right inguinal hernia.  The various methods of treatment have been discussed with the patient and family. After consideration of risks, benefits and other options for treatment, the patient has consented to  Procedure(s): XI ROBOTIC [place] (Right) as a surgical intervention.  The patient's history has been reviewed, patient examined, no change in status, stable for surgery.  I have reviewed the patient's chart and labs.  Questions were answered to the patient's satisfaction.     Trig Mcbryar

## 2020-11-06 NOTE — Progress Notes (Signed)
Patient crepitus SQ noted upon arrival to PACU, notified by CRNA Wynonia Sours. Crepitus felt upper right clavicle and sternal area, and down lateral side. Patient is not in distress or having difficulty breathing. Will leave 02 mask at 6 l/min for 30 minutes to assist in decreasing c02. Will monitor closely.

## 2020-11-06 NOTE — Anesthesia Procedure Notes (Signed)
Procedure Name: Intubation Performed by: Fredderick Phenix, CRNA Pre-anesthesia Checklist: Patient identified, Emergency Drugs available, Suction available and Patient being monitored Patient Re-evaluated:Patient Re-evaluated prior to induction Oxygen Delivery Method: Circle system utilized Preoxygenation: Pre-oxygenation with 100% oxygen Induction Type: IV induction Ventilation: Mask ventilation without difficulty Laryngoscope Size: Mac and 3 Grade View: Grade I Tube type: Oral Tube size: 7.5 mm Number of attempts: 1 Airway Equipment and Method: Stylet and Oral airway Placement Confirmation: ETT inserted through vocal cords under direct vision,  positive ETCO2 and breath sounds checked- equal and bilateral Secured at: 21 cm Tube secured with: Tape Dental Injury: Teeth and Oropharynx as per pre-operative assessment

## 2020-11-06 NOTE — Op Note (Signed)
Procedure Date:  11/06/2020  Pre-operative Diagnosis:  Right inguinal hernia  Post-operative Diagnosis:  Right inguinal hernia  Procedure: 1.  Robotic assisted Right Inguinal Hernia Repair 2.  Creation of Right Posterior Rectus-Transversalis Fascia Advancment Flap for Coverage of Pelvic Wound (200 cm)  Surgeon:  Melvyn Neth, MD  Assistant:  Jorge Ny, PA-S  Anesthesia:  General endotracheal  Estimated Blood Loss:  10 ml  Specimens:  None  Complications:  None  Indications for Procedure:  This is a 55 y.o. male who presents with a right inguinal hernia.  The options of surgery versus observation were reviewed with the patient and/or family. The risks of bleeding, abscess or infection, recurrence of symptoms, potential for an open procedure, injury to surrounding structures, and chronic pain were all discussed with the patient and was he willing to proceed.  We have planned this transabdominal procedure with the creation of right peritoneal flap based on the posterior rectus sheath and transversalis fascia in order to fully cover the mesh, creating a natural tisssue barrier for the bowel and peritoneal cavity.  Description of Procedure: The patient was correctly identified in the preoperative area and brought into the operating room.  The patient was placed supine with VTE prophylaxis in place.  Appropriate time-outs were performed.  Anesthesia was induced and the patient was intubated.  Foley catheter was placed.  Appropriate antibiotics were infused.  The abdomen was prepped and draped in a sterile fashion. A supraumbilical incision was made. A cutdown technique was used to enter the abdominal cavity without injury, and a Hasson trocar was inserted.  Pneumoperitoneum was obtained with appropriate opening pressures.  A Veress needle was used to start dissecting the peritoneal flap.  Two 8-mm robotic ports were placed in the right and left lateral positions under direct  visualization.  A Large Right 3D Max Mid Bard Mesh, a 2-0 Vicryl, and 2-0 vloc suture were placed through the umbilical port under direct visualization.  The [employer] platform was docked onto the patient, the camera was inserted and targeted, and the instruments were placed under direct visualization.  Both inguinal regions were inspected for hernias and it was confirmed that the patient had a right direct inguinal hernia.  Using electocautery, the peritoneal and posterior rectus tissue flap was created.  The peritoneum on the right side was scored from the median umbilical ligament laterally towards the ASIS.  The flap was mobilized using robotic scissors and the bipolar instruments, creating a plane along the posterior rectus sheath and transversalis fascia down to the pubic tubercle medially. It was then further mobilized laterally across the inguinal canal and femoral vessels and onto the psoas muscle. The inferior epigastric vessels were identified and preserved. This created a posterior rectus and peritoneal flap measuring roughly 17 cm x 12 cm.  The hernia sac and contents were reduced preserving all structures.  A cord lipoma was also identified and resected without complications.  Then, a right large Bard 3D Max Mid mesh was placed with good overlap along all the potential hernia defects and secured in place with 2-0 Vicryl along the medial superomedial and superolateral aspects.  Then, the peritoneal flap was advanced over the mesh and carried over to close the defect. A running 2-0 V lock suture was used to approximate the edge of the flap onto the peritoneum.  All needles were removed under direct visualization.  The 8- mm ports were removed under direct visualization and the Hasson trocar was removed.  The fascial  opening was closed using 0 vicryl suture.  Local anesthetic was infused in all incisions as well as a right ilioinguinal block, and the incisions were closed with 3-0 Vicryl and 4-0  Monocryl.  The wounds were cleaned and sealed with DermaBond.  Foley catheter was removed and the patient was emerged from anesthesia and extubated and brought to the recovery room for further management.  The patient tolerated the procedure well and all counts were correct at the end of the case.   Melvyn Neth, MD

## 2020-11-06 NOTE — Transfer of Care (Signed)
Immediate Anesthesia Transfer of Care Note  Patient: Frank Cabrera  Procedure(s) Performed: XI ROBOTIC ASSISTED INGUINAL HERNIA REPAIR WITH MESH (Right )  Patient Location: PACU  Anesthesia Type:General  Level of Consciousness: awake  Airway & Oxygen Therapy: Patient Spontanous Breathing and Patient connected to face mask oxygen  Post-op Assessment: Report given to RN and Post -op Vital signs reviewed and stable  Post vital signs: Reviewed and stable  Last Vitals:  Vitals Value Taken Time  BP 126/69 11/06/20 1300  Temp    Pulse 103 11/06/20 1302  Resp 20 11/06/20 1302  SpO2 100 % 11/06/20 1302  Vitals shown include unvalidated device data.  Last Pain:  Vitals:   11/06/20 0850  TempSrc: Temporal  PainSc: 0-No pain         Complications: No complications documented.

## 2020-11-06 NOTE — Progress Notes (Signed)
No changed noted in crepitus. Patient is stable and breathing is WNL. Patient denies difficulty. Pain is controlled. Crepitus explained to patient and wife by MD Dr. Hampton Abbot and nurse.

## 2020-11-06 NOTE — OR Nursing (Signed)
Ambulated to BR voided 75 cc.  Bladder scan for 330.  MD notified and OK'd dc home.  Instructed to come back to ER if unable to void at home. Verbalizes understanding.

## 2020-11-06 NOTE — Anesthesia Preprocedure Evaluation (Addendum)
Anesthesia Evaluation  Patient identified by MRN, date of birth, ID band Patient awake    Reviewed: Allergy & Precautions, H&P , NPO status , Patient's Chart, lab work & pertinent test results  History of Anesthesia Complications (+) PONV and history of anesthetic complications  Airway Mallampati: II       Dental no notable dental hx. (+) Teeth Intact   Pulmonary neg pulmonary ROS,    Pulmonary exam normal breath sounds clear to auscultation       Cardiovascular + CAD  Normal cardiovascular exam Rhythm:Regular Rate:Normal     Neuro/Psych  Neuromuscular disease negative psych ROS   GI/Hepatic Neg liver ROS, GERD  ,  Endo/Other  negative endocrine ROS  Renal/GU negative Renal ROS  negative genitourinary   Musculoskeletal  (+) Arthritis ,   Abdominal   Peds negative pediatric ROS (+)  Hematology negative hematology ROS (+)   Anesthesia Other Findings Past Medical History: No date: CAD (coronary artery disease) No date: GERD (gastroesophageal reflux disease) 2003: Herpes simplex type II infection 03/09/2019: History of 2019 novel coronavirus disease (COVID-19) No date: HLD (hyperlipidemia) No date: Long term current use of immunosuppressive drug No date: Myasthenia gravis ([place]) No date: Myasthenia gravis associated with thymoma ([place]) No date: Osteoporosis No date: Pansinusitis No date: PONV (postoperative nausea and vomiting)     Comment:  patient also stated that toes and fingers numbness after              the third day of surgery No date: Restless leg syndrome   Reproductive/Obstetrics negative OB ROS                             Anesthesia Physical Anesthesia Plan  ASA: III  Anesthesia Plan: General   Post-op Pain Management:    Induction: Intravenous  PONV Risk Score and Plan: 3 and Ondansetron and Dexamethasone  Airway Management Planned: Oral ETT  Additional  Equipment:   Intra-op Plan:   Post-operative Plan: Extubation in OR and Possible Post-op intubation/ventilation  Informed Consent: I have reviewed the patients History and Physical, chart, labs and discussed the procedure including the risks, benefits and alternatives for the proposed anesthesia with the patient or authorized representative who has indicated his/her understanding and acceptance.     Dental advisory given  Plan Discussed with: CRNA, Anesthesiologist and Surgeon  Anesthesia Plan Comments:        Anesthesia Quick Evaluation

## 2020-11-06 NOTE — Discharge Instructions (Signed)

## 2020-11-07 ENCOUNTER — Other Ambulatory Visit: Payer: No Typology Code available for payment source

## 2020-11-07 ENCOUNTER — Encounter: Payer: Self-pay | Admitting: Surgery

## 2020-11-07 ENCOUNTER — Telehealth: Payer: Self-pay | Admitting: *Deleted

## 2020-11-07 NOTE — Telephone Encounter (Signed)
Faxed FMLA to [employer] at [phone]

## 2020-11-20 LAB — COLOGUARD: Cologuard: NEGATIVE

## 2020-11-21 ENCOUNTER — Encounter: Payer: Self-pay | Admitting: Physician Assistant

## 2020-11-21 ENCOUNTER — Ambulatory Visit (INDEPENDENT_AMBULATORY_CARE_PROVIDER_SITE_OTHER): Payer: No Typology Code available for payment source | Admitting: Physician Assistant

## 2020-11-21 ENCOUNTER — Other Ambulatory Visit: Payer: Self-pay

## 2020-11-21 VITALS — BP 112/75 | HR 66 | Temp 98.6°F | Ht 66.0 in | Wt 157.0 lb

## 2020-11-21 DIAGNOSIS — K4090 Unilateral inguinal hernia, without obstruction or gangrene, not specified as recurrent: Secondary | ICD-10-CM

## 2020-11-21 DIAGNOSIS — Z09 Encounter for follow-up examination after completed treatment for conditions other than malignant neoplasm: Secondary | ICD-10-CM

## 2020-11-21 NOTE — Patient Instructions (Signed)
NO lifting greater than 10-15 pounds until 12/19/2020.

## 2020-11-21 NOTE — Progress Notes (Signed)
[facility] POST-OP OFFICE VISIT  11/21/2020  HPI: Frank Cabrera is a 55 y.o. male 15 days s/p robotic assisted laparoscopic right inguinal hernia repair with Dr Hampton Abbot.   He is doing well overall He has very intermittent episodes of pain, these are mild in nature and does not take any pain medications No fever, chills, nausea, emesis Normal bowel function No issues with incisions  Vital signs: There were no vitals taken for this visit.   Physical Exam: Constitutional: Well appearing male, NAD Abdomen: Soft, non-tender, non-distended, no rebound/guarding, no evidence of recurrence Skin: Laparoscopic incisions are well healed, no erythema or drainage   Assessment/Plan: This is a 55 y.o. male 15 days s/p robotic assisted laparoscopic right inguinal hernia repair   - Pain control prn  - Reviewed wound care  - Reviewed lifting restrictions; work note given  - He can follow up prn    -- Edison Simon, PA-C [facility] 11/21/2020, 10:23 AM [phone] M-F: 7am - 4pm

## 2020-11-23 ENCOUNTER — Other Ambulatory Visit: Payer: Self-pay | Admitting: Internal Medicine

## 2020-11-27 ENCOUNTER — Ambulatory Visit: Payer: No Typology Code available for payment source | Admitting: Internal Medicine

## 2020-12-16 ENCOUNTER — Other Ambulatory Visit: Payer: Self-pay | Admitting: Internal Medicine

## 2020-12-16 NOTE — Telephone Encounter (Signed)
Patien tlast Vit D low side of normal continue mega dose Vit d ?

## 2020-12-24 ENCOUNTER — Encounter: Payer: Self-pay | Admitting: Surgery

## 2021-01-08 DIAGNOSIS — E785 Hyperlipidemia, unspecified: Secondary | ICD-10-CM

## 2021-01-10 ENCOUNTER — Other Ambulatory Visit: Payer: Self-pay | Admitting: Internal Medicine

## 2021-01-10 ENCOUNTER — Other Ambulatory Visit: Payer: Self-pay | Admitting: Cardiovascular Disease

## 2021-01-10 DIAGNOSIS — E785 Hyperlipidemia, unspecified: Secondary | ICD-10-CM

## 2021-01-12 DIAGNOSIS — E785 Hyperlipidemia, unspecified: Secondary | ICD-10-CM

## 2021-02-06 ENCOUNTER — Encounter: Payer: Self-pay | Admitting: Surgery

## 2021-02-09 ENCOUNTER — Telehealth: Payer: Self-pay

## 2021-02-09 NOTE — Telephone Encounter (Signed)
Left message for patient to call office regarding message he left earlier.

## 2021-02-09 NOTE — Telephone Encounter (Signed)
Since surgery 11/11/20 Inguinal hernia repair- positional mild  discomfort -right in the middle of his abdomen- let patient know the mesh could be scarring down- he states when he bends over of if his abdomen is up against the table he notices it more- does not feel it when walking- denies fever. Denies issues with urinating- for the constipation try taking Miralax everyday and increase water- if no better call the office.

## 2021-03-04 ENCOUNTER — Encounter: Payer: Self-pay | Admitting: Surgery

## 2021-03-06 DIAGNOSIS — E785 Hyperlipidemia, unspecified: Secondary | ICD-10-CM

## 2021-03-06 MED ORDER — ROSUVASTATIN CALCIUM 5 MG PO TABS
5.0000 mg | ORAL_TABLET | Freq: Every day | ORAL | 1 refills | Status: DC
Start: 2021-03-06 — End: 2021-12-01

## 2021-03-10 ENCOUNTER — Other Ambulatory Visit (INDEPENDENT_AMBULATORY_CARE_PROVIDER_SITE_OTHER): Payer: No Typology Code available for payment source

## 2021-03-10 ENCOUNTER — Other Ambulatory Visit: Payer: Self-pay

## 2021-03-10 DIAGNOSIS — E785 Hyperlipidemia, unspecified: Secondary | ICD-10-CM | POA: Diagnosis not present

## 2021-03-10 LAB — LIPID PANEL
Cholesterol: 149 mg/dL (ref 0–200)
HDL: 68.2 mg/dL (ref >39.00)
LDL Cholesterol: 68 mg/dL (ref 0–99)
NonHDL: 80.51
Total CHOL/HDL Ratio: 2
Triglycerides: 62 mg/dL (ref 0.0–149.0)
VLDL: 12.4 mg/dL (ref 0.0–40.0)

## 2021-03-10 LAB — HEPATIC FUNCTION PANEL
ALT: 10 U/L (ref 0–53)
AST: 15 U/L (ref 0–37)
Albumin: 3.9 g/dL (ref 3.5–5.2)
Alkaline Phosphatase: 44 U/L (ref 39–117)
Bilirubin, Direct: 0.1 mg/dL (ref 0.0–0.3)
Total Bilirubin: 0.6 mg/dL (ref 0.2–1.2)
Total Protein: 6.1 g/dL (ref 6.0–8.3)

## 2021-03-19 ENCOUNTER — Other Ambulatory Visit: Payer: Self-pay | Admitting: Internal Medicine

## 2021-04-21 ENCOUNTER — Ambulatory Visit: Payer: No Typology Code available for payment source | Admitting: Cardiovascular Disease

## 2021-04-21 ENCOUNTER — Encounter: Payer: Self-pay | Admitting: Cardiovascular Disease

## 2021-04-21 ENCOUNTER — Other Ambulatory Visit: Payer: Self-pay

## 2021-04-21 VITALS — BP 120/60 | HR 51 | Ht 66.0 in | Wt 161.0 lb

## 2021-04-21 DIAGNOSIS — R931 Abnormal findings on diagnostic imaging of heart and coronary circulation: Secondary | ICD-10-CM

## 2021-04-21 DIAGNOSIS — E785 Hyperlipidemia, unspecified: Secondary | ICD-10-CM | POA: Diagnosis not present

## 2021-04-21 DIAGNOSIS — G7000 Myasthenia gravis without (acute) exacerbation: Secondary | ICD-10-CM | POA: Diagnosis not present

## 2021-04-21 NOTE — Patient Instructions (Signed)

## 2021-04-21 NOTE — Progress Notes (Signed)
Cardiology Office Note   Date:  04/21/2021   ID:  Frank Cabrera, DOB Feb 15, 1966, MRN [id number]  PCP:  [name], MD  Cardiologist:   Frank Sacramento, MD   Chief Complaint  Patient presents with   Other    12 month f/u c/o episode x1 wk ago chest pain and headache. Meds reviewed verbally with pt.      History of Present Illness: Frank Cabrera is a 55 y.o. male who is here today for follow-up visit regarding atypical chest pain and mild coronary atherosclerosis noted on cardiac CTA.   He has known history of myasthenia gravis currently being treated with Imuran and prednisone. He has been on steroids for many years. He has no history of diabetes, hypertension or hyperlipidemia. He is not a smoker. There is family history of atrial fibrillation but no coronary artery disease. He was seen by me in 2017 for atypical chest pain.  I requested an echo and a stress test at that time but his symptoms resolved before then and thus these tests were canceled. He was diagnosed with COVID-19 in May of 2020 and his symptoms manifested mainly by fatigue and chest pain.  The chest pain was pleuritic and was worse with lying down.  He did not require hospitalization.  His chest pain worsened in June, 2020 and he had an emergency room visit for that.   His troponin was negative.  Chest x-ray was unremarkable.     He continued to have atypical chest pain and thus he underwent an echocardiogram in February of this 2021 which showed normal LV systolic function,no evidence of pericardial effusion and no significant valvular abnormalities.   CTA of the coronary arteries in February 2021 showed a calcium score of 73 which was in the 49 percentile for age and sex matched group control.  There was mild calcification in the proximal LAD causing mild stenosis.    He has been doing well overall.  He did have some headache, sharp chest pain and fatigue recently that were similar to his previous COVID symptoms.  He  tested himself twice for COVID and came back negative.  He feels better today.  Past Medical History:  Diagnosis Date   CAD (coronary artery disease)    GERD (gastroesophageal reflux disease)    Herpes simplex type II infection 2003   History of 2019 novel coronavirus disease (COVID-19) 03/09/2019   HLD (hyperlipidemia)    Long term current use of immunosuppressive drug    Myasthenia gravis ([place])    Myasthenia gravis associated with thymoma ([place])    Osteoporosis    Pansinusitis    PONV (postoperative nausea and vomiting)    patient also stated that toes and fingers numbness after the third day of surgery   Restless leg syndrome     Past Surgical History:  Procedure Laterality Date   EYE MUSCLE SURGERY  2008   for strabismus, [place],  Board   HAND TENDON SURGERY Left 2021   JOINT REPLACEMENT Right 09/2020   hip replacement    SEPTOPLASTY  March 2013   STRABISMUS SURGERY  2006   TOTAL HIP ARTHROPLASTY Right 09/24/2020   Procedure: TOTAL HIP ARTHROPLASTY;  Surgeon: Dereck Leep, MD;  Location: [facility] ORS;  Service: Orthopedics;  Laterality: Right;   TOTAL THYMECTOMY  1995   XI ROBOTIC ASSISTED INGUINAL HERNIA REPAIR WITH MESH Right 11/06/2020   Procedure: XI ROBOTIC ASSISTED INGUINAL HERNIA REPAIR WITH MESH;  Surgeon: Olean Ree, MD;  Location: [facility] ORS;  Service: General;  Laterality: Right;     Current Outpatient Medications  Medication Sig Dispense Refill   acetaminophen (TYLENOL) 500 MG tablet Take 2 tablets (1,000 mg total) by mouth every 6 (six) hours as needed for mild pain.     alendronate (FOSAMAX) 35 MG tablet TAKE 1 TABLET BY MOUTH ONE TIME PER WEEK 12 tablet 1   azaTHIOprine (IMURAN) 50 MG tablet Take 150 mg by mouth daily.     CALCIUM PO Take 500 mg by mouth daily before breakfast.      Flaxseed, Linseed, (FLAXSEED OIL) 1000 MG CAPS Take 1,000 mg by mouth daily.     folic acid (FOLVITE) 1 MG tablet Take 1 mg by mouth daily.     Multiple Vitamins-Minerals  (MULTIVITAMIN WITH MINERALS) tablet Take 1 tablet by mouth daily.     pramipexole (MIRAPEX) 0.5 MG tablet TAKE 2 TABLETS (1 MG TOTAL) BY MOUTH AT BEDTIME. 180 tablet 1   predniSONE (DELTASONE) 10 MG tablet Take 25 mg by mouth every other day.     rosuvastatin (CRESTOR) 5 MG tablet Take 1 tablet (5 mg total) by mouth daily. 90 tablet 1   Vitamin D, Ergocalciferol, (DRISDOL) 1.25 MG (50000 UNIT) CAPS capsule TAKE 1 CAPSULE (50,000 UNITS TOTAL) BY MOUTH EVERY 14 (FOURTEEN) DAYS. 2 capsule 11   aspirin EC 81 MG tablet Take 81 mg by mouth daily. Swallow whole. (Patient not taking: Reported on 04/21/2021)     celecoxib (CELEBREX) 200 MG capsule Take 1 capsule (200 mg total) by mouth 2 (two) times daily. (Patient not taking: Reported on 04/21/2021) 90 capsule 0   Docusate Calcium (STOOL SOFTENER PO) Take 1 tablet by mouth daily. (Patient not taking: Reported on 04/21/2021)     omeprazole (PRILOSEC) 40 MG capsule TAKE 1 CAPSULE BY MOUTH EVERY DAY (Patient not taking: Reported on 04/21/2021) 90 capsule 0   ondansetron (ZOFRAN-ODT) 4 MG disintegrating tablet Take 4 mg by mouth every 8 (eight) hours as needed for nausea/vomiting. (Patient not taking: Reported on 04/21/2021)     No current facility-administered medications for this visit.    Allergies:   Cefadroxil    Social History:  The patient  reports that he has never smoked. He has never used smokeless tobacco. He reports that he does not drink alcohol and does not use drugs.   Family History:  The patient's family history includes Arrhythmia in his father; Cancer in his maternal grandmother and maternal uncle; Heart disease in his father and mother.    ROS:  Please see the history of present illness.   Otherwise, review of systems are positive for none.   All other systems are reviewed and negative.    PHYSICAL EXAM: VS:  BP 120/60 (BP Location: Left Arm, Patient Position: Sitting, Cuff Size: Normal)   Pulse (!) 51   Ht 5\' 6"  (1.676 m)   Wt 161 lb  (73 kg)   SpO2 98%   BMI 25.99 kg/m  , BMI Body mass index is 25.99 kg/m. GEN: Well nourished, well developed, in no acute distress  HEENT: normal  Neck: no JVD, carotid bruits, or masses Cardiac: RRR; no murmurs, rubs, or gallops,no edema  Respiratory:  clear to auscultation bilaterally, normal work of breathing GI: soft, nontender, nondistended, + BS MS: no deformity or atrophy  Skin: warm and dry, no rash Neuro:  Strength and sensation are intact Psych: euthymic mood, full affect   EKG:  EKG is ordered today. EKG showed sinus  bradycardia with no significant ST or T wave changes.   Recent Labs: 10/14/2020: Hemoglobin 12.2; Platelets 438.0 10/16/2020: BUN 16; Creatinine, Ser 0.87; Potassium 4.0; Sodium 140; TSH 1.38 03/10/2021: ALT 10    Lipid Panel    Component Value Date/Time   CHOL 149 03/10/2021 0826   CHOL 189 10/01/2015 1025   TRIG 62.0 03/10/2021 0826   HDL 68.20 03/10/2021 0826   HDL 70 10/01/2015 1025   CHOLHDL 2 03/10/2021 0826   VLDL 12.4 03/10/2021 0826   LDLCALC 68 03/10/2021 0826   LDLCALC 104 (H) 10/01/2015 1025      Wt Readings from Last 3 Encounters:  04/21/21 161 lb (73 kg)  11/21/20 157 lb (71.2 kg)  11/06/20 155 lb (70.3 kg)       ASSESSMENT AND PLAN:  1.  Elevated coronary calcium score:  The patient is known to have mildly elevated calcium score with mild proximal LAD stenosis.  Currently with no anginal symptoms.   I discussed with him the rationale for continuing low-dose aspirin and treatment of hyperlipidemia.  2.   Hyperlipidemia: I reviewed his recent labs which showed normal liver profile.  His lipid profile showed improvement in LDL to 68.  Based on this, I advised him to continue with rosuvastatin 5 mg once daily.  4. Myasthenia gravis: Symptoms are controlled with Imuran and prednisone.    Disposition:   FU with me in 1 year  Signed,  Frank Sacramento, MD  04/21/2021 4:11 PM    [place]

## 2021-06-30 ENCOUNTER — Ambulatory Visit
Admission: RE | Admit: 2021-06-30 | Discharge: 2021-06-30 | Disposition: A | Discharge status: Home / Self Care | Payer: No Typology Code available for payment source | Attending: Family Medicine | Admitting: Family Medicine

## 2021-06-30 ENCOUNTER — Encounter: Payer: Self-pay | Admitting: Family Medicine

## 2021-06-30 ENCOUNTER — Other Ambulatory Visit: Payer: Self-pay

## 2021-06-30 ENCOUNTER — Ambulatory Visit
Admission: RE | Admit: 2021-06-30 | Discharge: 2021-06-30 | Disposition: A | Discharge status: Home / Self Care | Payer: No Typology Code available for payment source | Source: Ambulatory Visit | Attending: Family Medicine | Admitting: Family Medicine

## 2021-06-30 ENCOUNTER — Telehealth (INDEPENDENT_AMBULATORY_CARE_PROVIDER_SITE_OTHER): Payer: No Typology Code available for payment source | Admitting: Family Medicine

## 2021-06-30 VITALS — Ht 66.0 in | Wt 155.0 lb

## 2021-06-30 DIAGNOSIS — R059 Cough, unspecified: Secondary | ICD-10-CM | POA: Diagnosis present

## 2021-06-30 MED ORDER — ALBUTEROL SULFATE HFA 108 (90 BASE) MCG/ACT IN AERS: 2.0000 | INHALATION_SPRAY | Freq: Four times a day (QID) | RESPIRATORY_TRACT | 0 refills | Status: DC | PRN

## 2021-06-30 MED ORDER — HYDROCODONE BIT-HOMATROP MBR 5-1.5 MG/5ML PO SOLN: 5.0000 mL | Freq: Every evening | ORAL | 0 refills | Status: DC | PRN

## 2021-06-30 MED ORDER — DOXYCYCLINE HYCLATE 100 MG PO TABS: 100.0000 mg | ORAL_TABLET | Freq: Two times a day (BID) | ORAL | 0 refills | Status: DC

## 2021-06-30 NOTE — Progress Notes (Signed)
Virtual Visit via video Note  This visit type was conducted due to national recommendations for restrictions regarding the COVID-19 pandemic (e.g. social distancing).  This format is felt to be most appropriate for this patient at this time.  All issues noted in this document were discussed and addressed.  No physical exam was performed (except for noted visual exam findings with Video Visits).   I connected with Sandi Mealy today at 10:00 AM EDT by a video enabled telemedicine application or telephone and verified that I am speaking with the correct person using two identifiers. Location patient: home Location provider: work  Persons participating in the virtual visit: patient, provider  I discussed the limitations, risks, security and privacy concerns of performing an evaluation and management service by telephone and the availability of in person appointments. I also discussed with the patient that there may be a patient responsible charge related to this service. The patient expressed understanding and agreed to proceed.  Reason for visit: same day visit  HPI: Cough: Patient notes onset on 06/25/2021.  Started with a sore throat and then developed cough with chest congestion.  He has had some wheezing.  He notes the throat is now just a tickle.  No postnasal drip.  He is coughing up some mucus and other times no mucus.  He notes one time he had some speckled blood in the mucus.  No significant hemoptysis.  He notes no taste or smell disturbances.  No shortness of breath.  No fevers.  He notes no known COVID exposures.  He had a negative home COVID test on 06/25/2021.  He is vaccinated and boosted.  He has been using over-the-counter extreme cold medication.  He notes he does not want to increase his prednisone dose at this time.  He feels as though his symptoms are slightly worsening.  He does have a history of myasthenia gravis.   ROS: See pertinent positives and negatives per HPI.  Past  Medical History:  Diagnosis Date   CAD (coronary artery disease)    GERD (gastroesophageal reflux disease)    Herpes simplex type II infection 2003   History of 2019 novel coronavirus disease (COVID-19) 03/09/2019   HLD (hyperlipidemia)    Long term current use of immunosuppressive drug    Myasthenia gravis ([place])    Myasthenia gravis associated with thymoma ([place])    Osteoporosis    Pansinusitis    PONV (postoperative nausea and vomiting)    patient also stated that toes and fingers numbness after the third day of surgery   Restless leg syndrome     Past Surgical History:  Procedure Laterality Date   EYE MUSCLE SURGERY  2008   for strabismus, [place],  [place]   HAND TENDON SURGERY Left 2021   JOINT REPLACEMENT Right 09/2020   hip replacement    SEPTOPLASTY  March 2013   STRABISMUS SURGERY  2006   TOTAL HIP ARTHROPLASTY Right 09/24/2020   Procedure: TOTAL HIP ARTHROPLASTY;  Surgeon: Dereck Leep, MD;  Location: [facility] ORS;  Service: Orthopedics;  Laterality: Right;   TOTAL THYMECTOMY  1995   XI ROBOTIC ASSISTED INGUINAL HERNIA REPAIR WITH MESH Right 11/06/2020   Procedure: XI ROBOTIC ASSISTED INGUINAL HERNIA REPAIR WITH MESH;  Surgeon: Olean Ree, MD;  Location: [facility] OR[facility];  Service: General;  Laterality: Right;    Family History  Problem Relation Age of Onset   Heart disease Mother    Heart disease Father    Arrhythmia Father  Cancer Maternal Uncle        prostate   Cancer Maternal Grandmother        colon    SOCIAL HX: Non-smoker   Current Outpatient Medications:    acetaminophen (TYLENOL) 500 MG tablet, Take 2 tablets (1,000 mg total) by mouth every 6 (six) hours as needed for mild pain., Disp: , Rfl:    albuterol (VENTOLIN HFA) 108 (90 Base) MCG/ACT inhaler, Inhale 2 puffs into the lungs every 6 (six) hours as needed for wheezing or shortness of breath., Disp: 8 g, Rfl: 0   alendronate (FOSAMAX) 35 MG tablet, TAKE 1 TABLET BY MOUTH ONE TIME PER WEEK, Disp: 12  tablet, Rfl: 1   aspirin EC 81 MG tablet, Take 81 mg by mouth daily. Swallow whole., Disp: , Rfl:    azaTHIOprine (IMURAN) 50 MG tablet, Take 150 mg by mouth daily., Disp: , Rfl:    CALCIUM PO, Take 500 mg by mouth daily before breakfast. , Disp: , Rfl:    celecoxib (CELEBREX) 200 MG capsule, Take 1 capsule (200 mg total) by mouth 2 (two) times daily., Disp: 90 capsule, Rfl: 0   Docusate Calcium (STOOL SOFTENER PO), Take 1 tablet by mouth daily., Disp: , Rfl:    doxycycline (VIBRA-TABS) 100 MG tablet, Take 1 tablet (100 mg total) by mouth 2 (two) times daily., Disp: 14 tablet, Rfl: 0   Flaxseed, Linseed, (FLAXSEED OIL) 1000 MG CAPS, Take 1,000 mg by mouth daily., Disp: , Rfl:    folic acid (FOLVITE) 1 MG tablet, Take 1 mg by mouth daily., Disp: , Rfl:    HYDROcodone bit-homatropine (HYCODAN) 5-1.5 MG/5ML syrup, Take 5 mLs by mouth at bedtime as needed for cough., Disp: 60 mL, Rfl: 0   Multiple Vitamins-Minerals (MULTIVITAMIN WITH MINERALS) tablet, Take 1 tablet by mouth daily., Disp: , Rfl:    omeprazole (PRILOSEC) 40 MG capsule, TAKE 1 CAPSULE BY MOUTH EVERY DAY, Disp: 90 capsule, Rfl: 0   ondansetron (ZOFRAN-ODT) 4 MG disintegrating tablet, Take 4 mg by mouth every 8 (eight) hours as needed for nausea/vomiting., Disp: , Rfl:    pramipexole (MIRAPEX) 0.5 MG tablet, TAKE 2 TABLETS (1 MG TOTAL) BY MOUTH AT BEDTIME., Disp: 180 tablet, Rfl: 1   predniSONE (DELTASONE) 10 MG tablet, Take 25 mg by mouth every other day., Disp: , Rfl:    rosuvastatin (CRESTOR) 5 MG tablet, Take 1 tablet (5 mg total) by mouth daily., Disp: 90 tablet, Rfl: 1   Vitamin D, Ergocalciferol, (DRISDOL) 1.25 MG (50000 UNIT) CAPS capsule, TAKE 1 CAPSULE (50,000 UNITS TOTAL) BY MOUTH EVERY 14 (FOURTEEN) DAYS., Disp: 2 capsule, Rfl: 11  EXAM:  VITALS per patient if applicable:  GENERAL: alert, oriented, appears well and in no acute distress  HEENT: atraumatic, conjunttiva clear, no obvious abnormalities on inspection of  external nose and ears  NECK: normal movements of the head and neck  LUNGS: on inspection no signs of respiratory distress, breathing rate appears normal, no obvious gross SOB, gasping or wheezing  CV: no obvious cyanosis  MS: moves all visible extremities without noticeable abnormality  PSYCH/NEURO: pleasant and cooperative, no obvious depression or anxiety, speech and thought processing grossly intact  ASSESSMENT AND PLAN:  Discussed the following assessment and plan:  Problem List Items Addressed This Visit     Cough - Primary    Suspect bronchitis versus COVID-19 versus pneumonia.  He will have a chest x-ray completed.  We will treat with doxycycline.  We will also send in albuterol  for him to use.  We will hold off on increasing the prednisone dose.  He can use Hycodan for his cough at night.  Discussed risk of drowsiness and respiratory depression with this.  Advised to seek medical attention for chest pain, shortness of breath, additional hemoptysis, or any worsening symptoms.  I did discuss having him come to our office for a COVID PCR test.  Discussed that these seem to be more accurate with regards to negative results.  He opted to do another home COVID test.  Advised he should let us know if it is positive.      Relevant Medications   doxycycline (VIBRA-TABS) 100 MG tablet   albuterol (VENTOLIN HFA) 108 (90 Base) MCG/ACT inhaler   HYDROcodone bit-homatropine (HYCODAN) 5-1.5 MG/5ML syrup   Other Relevant Orders   DG Chest 2 View    Return if symptoms worsen or fail to improve.   I discussed the assessment and treatment plan with the patient. The patient was provided an opportunity to ask questions and all were answered. The patient agreed with the plan and demonstrated an understanding of the instructions.   The patient was advised to call back or seek an in-person evaluation if the symptoms worsen or if the condition fails to improve as anticipated.   Tommi Rumps,  MD

## 2021-06-30 NOTE — Assessment & Plan Note (Addendum)
Suspect bronchitis versus COVID-19 versus pneumonia.  He will have a chest x-ray completed.  We will treat with doxycycline.  We will also send in albuterol for him to use.  We will hold off on increasing the prednisone dose.  He can use Hycodan for his cough at night.  Discussed risk of drowsiness and respiratory depression with this.  Advised to seek medical attention for chest pain, shortness of breath, additional hemoptysis, or any worsening symptoms.  I did discuss having him come to our office for a COVID PCR test.  Discussed that these seem to be more accurate with regards to negative results.  He opted to do another home COVID test.  Advised he should let us know if it is positive.

## 2021-07-22 ENCOUNTER — Other Ambulatory Visit: Payer: Self-pay | Admitting: Family Medicine

## 2021-07-22 DIAGNOSIS — R059 Cough, unspecified: Secondary | ICD-10-CM

## 2021-10-01 ENCOUNTER — Ambulatory Visit: Payer: Self-pay | Admitting: Urology

## 2021-10-08 ENCOUNTER — Other Ambulatory Visit: Payer: Self-pay | Admitting: Internal Medicine

## 2021-10-08 ENCOUNTER — Other Ambulatory Visit: Payer: Self-pay

## 2021-10-08 ENCOUNTER — Encounter: Payer: Self-pay | Admitting: Urology

## 2021-10-08 ENCOUNTER — Ambulatory Visit: Payer: No Typology Code available for payment source | Admitting: Urology

## 2021-10-08 ENCOUNTER — Encounter: Payer: Self-pay | Admitting: Internal Medicine

## 2021-10-08 VITALS — BP 104/66 | HR 72 | Ht 66.0 in | Wt 153.0 lb

## 2021-10-08 DIAGNOSIS — R310 Gross hematuria: Secondary | ICD-10-CM | POA: Diagnosis not present

## 2021-10-08 NOTE — Progress Notes (Signed)
10/08/2021 11:03 AM   Frank Cabrera May 18, 1966 [id number]  Referring provider: [name], MD [place],  [place] [address]  Chief Complaint  Patient presents with   Hematuria    HPI: Frank Cabrera is a 56 y.o. male who presents for evaluation of hematuria.  Intermittent gross hematuria since August 2022 Episodes every 4-6 weeks; ~ 1 hour after voiding will have urgency and note initial stream hematuria with that void Last episode 09/25/2021 Is also noted left hemiscrotal pain and bilateral low back pain. He has noted other complaints including headache, chest pain and breast enlargement Recent urgent care visit and was started on antibiotics for a possible infection but states he was contacted stating his urine infection test was negative and to stop the antibiotics No prior urologic history or previous urologic evaluation No prior tobacco history   PMH: Past Medical History:  Diagnosis Date   CAD (coronary artery disease)    GERD (gastroesophageal reflux disease)    Herpes simplex type II infection 2003   History of 2019 novel coronavirus disease (COVID-19) 03/09/2019   HLD (hyperlipidemia)    Long term current use of immunosuppressive drug    Myasthenia gravis ([place])    Myasthenia gravis associated with thymoma ([place])    Osteoporosis    Pansinusitis    PONV (postoperative nausea and vomiting)    patient also stated that toes and fingers numbness after the third day of surgery   Restless leg syndrome     Surgical History: Past Surgical History:  Procedure Laterality Date   EYE MUSCLE SURGERY  2008   for strabismus, [place],  Board   HAND TENDON SURGERY Left 2021   JOINT REPLACEMENT Right 09/2020   hip replacement    SEPTOPLASTY  March 2013   STRABISMUS SURGERY  2006   TOTAL HIP ARTHROPLASTY Right 09/24/2020   Procedure: TOTAL HIP ARTHROPLASTY;  Surgeon: Dereck Leep, MD;  Location: [facility];  Service: Orthopedics;  Laterality:  Right;   TOTAL THYMECTOMY  1995   XI ROBOTIC ASSISTED INGUINAL HERNIA REPAIR WITH MESH Right 11/06/2020   Procedure: XI ROBOTIC ASSISTED INGUINAL HERNIA REPAIR WITH MESH;  Surgeon: Olean Ree, MD;  Location: [facility];  Service: General;  Laterality: Right;    Home Medications:  Allergies as of 10/08/2021       Reactions   Cefadroxil Rash        Medication List        Accurate as of October 08, 2021 11:03 AM. If you have any questions, ask your nurse or doctor.          STOP taking these medications    acetaminophen 500 MG tablet Commonly known as: TYLENOL Stopped by: Abbie Sons, MD   albuterol 108 (90 Base) MCG/ACT inhaler Commonly known as: VENTOLIN HFA Stopped by: Abbie Sons, MD   doxycycline 100 MG tablet Commonly known as: VIBRA-TABS Stopped by: Abbie Sons, MD   HYDROcodone bit-homatropine 5-1.5 MG/5ML syrup Commonly known as: HYCODAN Stopped by: Abbie Sons, MD   multivitamin with minerals tablet Stopped by: Abbie Sons, MD   ondansetron 4 MG disintegrating tablet Commonly known as: ZOFRAN-ODT Stopped by: Abbie Sons, MD       TAKE these medications    alendronate 35 MG tablet Commonly known as: FOSAMAX TAKE 1 TABLET BY MOUTH ONE TIME PER WEEK   aspirin EC 81 MG tablet Take 81 mg by mouth daily. Swallow whole.  azaTHIOprine 50 MG tablet Commonly known as: IMURAN Take 150 mg by mouth daily.   CALCIUM PO Take 500 mg by mouth daily before breakfast.   celecoxib 200 MG capsule Commonly known as: CELEBREX Take 1 capsule (200 mg total) by mouth 2 (two) times daily.   Flaxseed Oil 1000 MG Caps Take 1,000 mg by mouth daily.   folic acid 1 MG tablet Commonly known as: FOLVITE Take 1 mg by mouth daily.   omeprazole 40 MG capsule Commonly known as: PRILOSEC TAKE 1 CAPSULE BY MOUTH EVERY DAY   pramipexole 0.5 MG tablet Commonly known as: MIRAPEX TAKE 2 TABLETS (1 MG TOTAL) BY MOUTH AT BEDTIME.   predniSONE 10  MG tablet Commonly known as: DELTASONE Take 25 mg by mouth every other day.   rosuvastatin 5 MG tablet Commonly known as: CRESTOR Take 1 tablet (5 mg total) by mouth daily.   STOOL SOFTENER PO Take 1 tablet by mouth daily.   Vitamin D (Ergocalciferol) 1.25 MG (50000 UNIT) Caps capsule Commonly known as: DRISDOL TAKE 1 CAPSULE (50,000 UNITS TOTAL) BY MOUTH EVERY 14 (FOURTEEN) DAYS.        Allergies:  Allergies  Allergen Reactions   Cefadroxil Rash    Family History: Family History  Problem Relation Age of Onset   Heart disease Mother    Heart disease Father    Arrhythmia Father    Cancer Maternal Uncle        prostate   Cancer Maternal Grandmother        colon    Social History:  reports that he has never smoked. He has never used smokeless tobacco. He reports that he does not drink alcohol and does not use drugs.   Physical Exam: BP 104/66    Pulse 72    Ht 5\' 6"  (1.676 m)    Wt 153 lb (69.4 kg)    BMI 24.69 kg/m   Constitutional:  Alert, no acute distress. HEENT: [place] AT, moist mucus membranes.  Trachea midline, no masses. Cardiovascular: No clubbing, cyanosis, or edema. Respiratory: Normal respiratory effort, no increased work of breathing. GI: Abdomen is soft, nontender, nondistended, no abdominal masses GU: Phallus without lesions, testes descended bilaterally without masses or tenderness.  Prostate 40 g, smooth without nodules Skin: No rashes, bruises or suspicious lesions. Neurologic: Grossly intact, no focal deficits, moving all 4 extremities. Psychiatric: Normal mood and affect.  Laboratory Data:  Urinalysis Dipstick negative RBC/negative microscopy   Assessment & Plan:    1.  Gross hematuria AUA hematuria risk stratification: High We discussed the recommended evaluation for high risk hematuria to include CT urogram and cystoscopy.  The procedures were discussed in detail and he desires to proceed with further evaluation. CTU order placed and  cystoscopy was scheduled   Abbie Sons, MD  [facility] [address], [place], [place] [address] [phone]

## 2021-10-09 LAB — URINALYSIS, COMPLETE
Bilirubin, UA: NEGATIVE
Glucose, UA: NEGATIVE
Leukocytes,UA: NEGATIVE
Nitrite, UA: NEGATIVE
Protein,UA: NEGATIVE
RBC, UA: NEGATIVE
Specific Gravity, UA: 1.02 (ref 1.005–1.030)
Urobilinogen, Ur: 0.2 mg/dL (ref 0.2–1.0)
pH, UA: 7 (ref 5.0–7.5)

## 2021-10-09 LAB — MICROSCOPIC EXAMINATION
Bacteria, UA: NONE SEEN
Epithelial Cells (non renal): NONE SEEN /hpf (ref 0–10)

## 2021-10-09 NOTE — Telephone Encounter (Signed)
Patient called in stated would like to get some labs run and requesting orders to check for cancer and would like call back . Patient stated you can reach him at [phone]

## 2021-10-10 ENCOUNTER — Encounter: Payer: Self-pay | Admitting: Urology

## 2021-10-12 ENCOUNTER — Other Ambulatory Visit: Payer: Self-pay | Admitting: Internal Medicine

## 2021-10-12 DIAGNOSIS — Z79899 Other long term (current) drug therapy: Secondary | ICD-10-CM

## 2021-10-12 NOTE — Telephone Encounter (Signed)
I spoke with pt to let him know that that the message had been sent to Dr. Derrel Nip but that she probably would not order any tumor morkers or testing for cancer without talkng to the pt before hand. The pt asked for an appt sooner than his physical on Friday with Dr. Derrel Nip because for the last several weeks he has been having back pain, neck pain, headaches and intermittent blood in his urine. Pt did just see Dr. Darlys Gales and he has ordered a CT scan, not scheduled yet. Pt has been scheduled with Sharyn Lull, NP on Wednesday morning at 7:30. Pt would not wait till appt on Friday.

## 2021-10-12 NOTE — Telephone Encounter (Signed)
Patient called about the status of his MyChart message. He is seeing Dr Derrel Nip this Friday and wanted to know if he could have labs done. He also wanted to see Dr Derrel Nip sooner, he thinks he has cancer.

## 2021-10-12 NOTE — Telephone Encounter (Signed)
Noted  

## 2021-10-14 ENCOUNTER — Other Ambulatory Visit (INDEPENDENT_AMBULATORY_CARE_PROVIDER_SITE_OTHER): Payer: No Typology Code available for payment source

## 2021-10-14 ENCOUNTER — Ambulatory Visit: Payer: No Typology Code available for payment source | Admitting: Adult Health

## 2021-10-14 ENCOUNTER — Other Ambulatory Visit: Payer: Self-pay

## 2021-10-14 DIAGNOSIS — Z79899 Other long term (current) drug therapy: Secondary | ICD-10-CM

## 2021-10-15 LAB — CBC WITH DIFFERENTIAL/PLATELET
Basophils Absolute: 0.1 10*3/uL (ref 0.0–0.1)
Basophils Relative: 1.2 % (ref 0.0–3.0)
Eosinophils Absolute: 0.1 10*3/uL (ref 0.0–0.7)
Eosinophils Relative: 0.8 % (ref 0.0–5.0)
HCT: 41.1 % (ref 39.0–52.0)
Hemoglobin: 13.7 g/dL (ref 13.0–17.0)
Lymphocytes Relative: 17.5 % (ref 12.0–46.0)
Lymphs Abs: 1.2 10*3/uL (ref 0.7–4.0)
MCHC: 33.4 g/dL (ref 30.0–36.0)
MCV: 90.2 fl (ref 78.0–100.0)
Monocytes Absolute: 0.6 10*3/uL (ref 0.1–1.0)
Monocytes Relative: 8.3 % (ref 3.0–12.0)
Neutro Abs: 5 10*3/uL (ref 1.4–7.7)
Neutrophils Relative %: 72.2 % (ref 43.0–77.0)
Platelets: 291 10*3/uL (ref 150.0–400.0)
RBC: 4.56 Mil/uL (ref 4.22–5.81)
RDW: 14.7 % (ref 11.5–15.5)
WBC: 7 10*3/uL (ref 4.0–10.5)

## 2021-10-15 LAB — COMPREHENSIVE METABOLIC PANEL
ALT: 15 U/L (ref 0–53)
AST: 20 U/L (ref 0–37)
Albumin: 4.2 g/dL (ref 3.5–5.2)
Alkaline Phosphatase: 42 U/L (ref 39–117)
BUN: 17 mg/dL (ref 6–23)
CO2: 30 mEq/L (ref 19–32)
Calcium: 9.7 mg/dL (ref 8.4–10.5)
Chloride: 101 mEq/L (ref 96–112)
Creatinine, Ser: 1.04 mg/dL (ref 0.40–1.50)
GFR: 82.12 mL/min (ref >60.00)
Glucose, Bld: 90 mg/dL (ref 70–99)
Potassium: 4.5 mEq/L (ref 3.5–5.1)
Sodium: 138 mEq/L (ref 135–145)
Total Bilirubin: 0.5 mg/dL (ref 0.2–1.2)
Total Protein: 6.5 g/dL (ref 6.0–8.3)

## 2021-10-16 ENCOUNTER — Ambulatory Visit: Payer: No Typology Code available for payment source | Admitting: Internal Medicine

## 2021-10-16 ENCOUNTER — Encounter: Payer: Self-pay | Admitting: Internal Medicine

## 2021-10-16 ENCOUNTER — Other Ambulatory Visit: Payer: Self-pay

## 2021-10-16 VITALS — BP 120/72 | HR 60 | Temp 98.7°F | Ht 66.0 in | Wt 156.8 lb

## 2021-10-16 DIAGNOSIS — N62 Hypertrophy of breast: Secondary | ICD-10-CM

## 2021-10-16 DIAGNOSIS — R310 Gross hematuria: Secondary | ICD-10-CM | POA: Diagnosis not present

## 2021-10-16 DIAGNOSIS — R0989 Other specified symptoms and signs involving the circulatory and respiratory systems: Secondary | ICD-10-CM | POA: Diagnosis not present

## 2021-10-16 DIAGNOSIS — F418 Other specified anxiety disorders: Secondary | ICD-10-CM | POA: Diagnosis not present

## 2021-10-16 MED ORDER — ALPRAZOLAM 0.25 MG PO TABS: 0.2500 mg | ORAL_TABLET | Freq: Two times a day (BID) | ORAL | 2 refills | Status: DC | PRN

## 2021-10-16 NOTE — Progress Notes (Signed)
Subjective:  Patient ID: Frank Cabrera, male    DOB: [birth year]  Age: 56 y.o. MRN: [id number]  CC: The primary encounter diagnosis was Gynecomastia. Diagnoses of Anxiety about health, Decreased pulses in feet, and Gross hematuria were also pertinent to this visit.   This visit occurred during the SARS-CoV-2 public health emergency.  Safety protocols were in place, including screening questions prior to the visit, additional usage of staff PPE, and extensive cleaning of exam room while observing appropriate contact time as indicated for disinfecting solutions.    HPI Frank Cabrera presents for  Chief Complaint  Patient presents with   Follow-up    Follow up     Frank Cabrera is a 3 yr ol male with Myasthenia gravis, managed with azothiprine, who presents today with multiple concerns and somatic complaints.   History of intermittent left testicular pain  for the last 1.5 years.  Lately  has been more recurrent and rates a 3/10 ,  aggravated by extended walking and  climbing.   He has concurrent low back pain , chronic .  Has improved with some exercises to extend back ,  but testicular pain has not improved.  Hematuria:   first noticed 6 months ago in the setting of a blunt trauma to the penile head  when he feel on a ladder.  The injury resulted in  swelling .  Bleeding has been intermittent every 4 weeks but became more frequent 3 weeks ago, accompanied by pain with urination . Head of penis has been discolored  since the injury.  He was seen by Dr Bernardo Heater Jan 5 and has become overly concerned that he has bladder cancer that has metastasized   Lower abdominal pain  since his hernia repair feb 2021 ([facility] done with mesh) .  Has been more painful lately in area of surgery , tender .  increase in breast tissue bilaterally.   Chest pain, cough, wheezing and shortness of breath .  Has not been an issue in the last several days.  COVID test was negative one week ago,  but had several days of  headache, anterior neck pain , for the last 4-6 weeks, and Last Friday developed a headache that started in the top of head and spread to forehead.  Also had some brain fog . Has lasted a week  and has gradually improved,  now a 1/10  Has had several  days of "tingling and flushing all over" , but for the last several days the tingling has  localized to his armpits  For the last 4 nights, has had bone and joint pain waking him up  caused by pressure on the mattress.  Has developed a lower abdominal bulge.  Feels he has had an increased urge of  urination with voiding small amounts.   Saw Urology   [facility])  for the GU symptoms recently.  Cystoscopy and CT planned.   MG:  taking azothioprine   Aortic atherosclerosis by CT April 2021: he is taking  crestor and ASA for elevated calcium score, atherosclerosis by CT April 202 l  Outpatient Medications Prior to Visit  Medication Sig Dispense Refill   alendronate (FOSAMAX) 35 MG tablet TAKE 1 TABLET BY MOUTH ONE TIME PER WEEK 12 tablet 0   aspirin EC 81 MG tablet Take 81 mg by mouth daily. Swallow whole.     azaTHIOprine (IMURAN) 50 MG tablet Take 150 mg by mouth daily.     CALCIUM PO Take 500  mg by mouth daily before breakfast.      Flaxseed, Linseed, (FLAXSEED OIL) 1000 MG CAPS Take 1,000 mg by mouth daily.     folic acid (FOLVITE) 1 MG tablet Take 1 mg by mouth daily.     pramipexole (MIRAPEX) 0.5 MG tablet TAKE 2 TABLETS (1 MG TOTAL) BY MOUTH AT BEDTIME. 180 tablet 1   predniSONE (DELTASONE) 10 MG tablet Take 25 mg by mouth every other day.     rosuvastatin (CRESTOR) 5 MG tablet Take 1 tablet (5 mg total) by mouth daily. 90 tablet 1   Vitamin D, Ergocalciferol, (DRISDOL) 1.25 MG (50000 UNIT) CAPS capsule TAKE 1 CAPSULE (50,000 UNITS TOTAL) BY MOUTH EVERY 14 (FOURTEEN) DAYS. 2 capsule 11   celecoxib (CELEBREX) 200 MG capsule Take 1 capsule (200 mg total) by mouth 2 (two) times daily. (Patient not taking: Reported on 10/16/2021) 90 capsule 0    Docusate Calcium (STOOL SOFTENER PO) Take 1 tablet by mouth daily. (Patient not taking: Reported on 10/16/2021)     omeprazole (PRILOSEC) 40 MG capsule TAKE 1 CAPSULE BY MOUTH EVERY DAY (Patient not taking: Reported on 10/16/2021) 90 capsule 0   No facility-administered medications prior to visit.    Review of Systems;  Patient denies headache, fevers, malaise, unintentional weight loss, skin rash, eye pain, sinus congestion and sinus pain, sore throat, dysphagia,  hemoptysis , cough, dyspnea, wheezing, chest pain, palpitations, orthopnea, edema, abdominal pain, nausea, melena, diarrhea, constipation, flank pain, dysuria, hematuria, urinary  Frequency, nocturia, numbness, tingling, seizures,  Focal weakness, Loss of consciousness,  Tremor, insomnia, depression, anxiety, and suicidal ideation.      Objective:  BP 120/72 (BP Location: Left Arm, Patient Position: Sitting, Cuff Size: Normal)    Pulse 60    Temp 98.7 F (37.1 C) (Oral)    Ht 5\' 6"  (1.676 m)    Wt 156 lb 12.8 oz (71.1 kg)    SpO2 98%    BMI 25.31 kg/m   BP Readings from Last 3 Encounters:  10/16/21 120/72  10/08/21 104/66  04/21/21 120/60    Wt Readings from Last 3 Encounters:  10/16/21 156 lb 12.8 oz (71.1 kg)  10/08/21 153 lb (69.4 kg)  06/30/21 155 lb (70.3 kg)    General appearance: alert, anxious, cooperative and appears stated age Ears: normal TM's and external ear canals both ears Throat: lips, mucosa, and tongue normal; teeth and gums normal Neck: no adenopathy, no carotid bruit, supple, symmetrical, trachea midline and thyroid not enlarged, symmetric, no tenderness/mass/nodules Back: symmetric, no curvature. ROM normal. No CVA tenderness. Lungs: clear to auscultation bilaterally Chests:  enlarged breasts bilaterally Heart: regular rate and rhythm, S1, S2 normal, no murmur, click, rub or gallop Abdomen: soft, non-tender; bowel sounds normal; lower abdomen without bulges,  masses,  no organomegaly Pulses: 2+ and  symmetric Skin: Skin color, texture, turgor normal. No rashes or lesions Lymph nodes: Cervical, supraclavicular, and axillary nodes normal.  Lab Results  Component Value Date   HGBA1C 5.4 10/16/2020    Lab Results  Component Value Date   CREATININE 1.04 10/14/2021   CREATININE 0.87 10/16/2020   CREATININE 0.83 09/18/2020    Lab Results  Component Value Date   WBC 7.0 10/14/2021   HGB 13.7 10/14/2021   HCT 41.1 10/14/2021   PLT 291.0 10/14/2021   GLUCOSE 90 10/14/2021   CHOL 149 03/10/2021   TRIG 62.0 03/10/2021   HDL 68.20 03/10/2021   LDLCALC 68 03/10/2021   ALT 15 10/14/2021   AST  20 10/14/2021   NA 138 10/14/2021   K 4.5 10/14/2021   CL 101 10/14/2021   CREATININE 1.04 10/14/2021   BUN 17 10/14/2021   CO2 30 10/14/2021   TSH 1.38 10/16/2020   INR 0.9 09/18/2020   HGBA1C 5.4 10/16/2020    DG Chest 2 View  Result Date: 06/30/2021 CLINICAL DATA:  56 year old male with history of cough, wheezing and chest congestion. EXAM: CHEST - 2 VIEW COMPARISON:  Chest x-ray 03/24/2019. FINDINGS: Lung volumes are normal. No consolidative airspace disease. No pleural effusions. No pneumothorax. No pulmonary nodule or mass noted. Pulmonary vasculature and the cardiomediastinal silhouette are within normal limits. Status post median sternotomy. IMPRESSION: No radiographic evidence of acute cardiopulmonary disease. Electronically Signed   By: Vinnie Langton M.D.   On: 06/30/2021 21:13    Assessment & Plan:   Problem List Items Addressed This Visit     Decreased pulses in feet    He was referred to Vascular surgery for evaluation but ultimately decided not to go.       Anxiety about health    His anxiety regarding the hematuria he has been having has led him to the conclusion that he has metastatic cancer.  Reassurance provided that the 3 week delay until his diagnostic procedures (Cystoscopy and CT) will not compromise his prognosis should cancer be diagnosed.  He is not  sleeping well.  Due to early wake ups and an inability to return to sleep.  Alprazolam in a low dose prescribed.       Relevant Medications   ALPRAZolam (XANAX) 0.25 MG tablet   Gynecomastia - Primary    I will order mammogram and if gynecmastia is confirmed.  Will check  Estradiol, LH, morning testosterone       Relevant Orders   MM DIAG BREAST TOMO BILATERAL   Gross hematuria    Occurred in the setting of blunt penile trauma.  Advised hime that the cuase may be  due to a urethral hematoma,  Not cancer .  Alprazolam prescribed.       Meds ordered this encounter  Medications   ALPRAZolam (XANAX) 0.25 MG tablet    Sig: Take 1 tablet (0.25 mg total) by mouth 2 (two) times daily as needed for anxiety.    Dispense:  30 tablet    Refill:  2     I provided  40 minutes of  face-to-face time during this encounter reviewing patient's current problems and past surgeries, labs and imaging studies, providing counseling on the above mentioned problems , and coordination  of care .   Follow-up: Return in about 4 weeks (around 11/13/2021).   [name], MD

## 2021-10-16 NOTE — Patient Instructions (Signed)
I am recommending a trial of Alprazolam 1/2 tablet for early wakeups.  You may use the other half during the day if needed   I have ordered a mammogram to evaluate the enlargement you have developed in your chest

## 2021-10-17 DIAGNOSIS — F418 Other specified anxiety disorders: Secondary | ICD-10-CM | POA: Insufficient documentation

## 2021-10-17 DIAGNOSIS — N62 Hypertrophy of breast: Secondary | ICD-10-CM | POA: Insufficient documentation

## 2021-10-17 DIAGNOSIS — R310 Gross hematuria: Secondary | ICD-10-CM | POA: Insufficient documentation

## 2021-10-17 DIAGNOSIS — R4589 Other symptoms and signs involving emotional state: Secondary | ICD-10-CM | POA: Insufficient documentation

## 2021-10-17 NOTE — Assessment & Plan Note (Signed)
He was referred to Vascular surgery for evaluation but ultimately decided not to go.

## 2021-10-17 NOTE — Assessment & Plan Note (Signed)
His anxiety regarding the hematuria he has been having has led him to the conclusion that he has metastatic cancer.  Reassurance provided that the 3 week delay until his diagnostic procedures (Cystoscopy and CT) will not compromise his prognosis should cancer be diagnosed.  He is not sleeping well.  Due to early wake ups and an inability to return to sleep.  Alprazolam in a low dose prescribed.

## 2021-10-17 NOTE — Assessment & Plan Note (Addendum)
I will order mammogram and if gynecmastia is confirmed.  Will check  Estradiol, LH, morning testosterone

## 2021-10-17 NOTE — Assessment & Plan Note (Signed)
Occurred in the setting of blunt penile trauma.  Advised hime that the cuase may be  due to a urethral hematoma,  Not cancer .  Alprazolam prescribed.

## 2021-10-19 ENCOUNTER — Encounter: Payer: Self-pay | Admitting: Urology

## 2021-10-20 ENCOUNTER — Encounter: Payer: Self-pay | Admitting: Urology

## 2021-10-20 ENCOUNTER — Ambulatory Visit (HOSPITAL_BASED_OUTPATIENT_CLINIC_OR_DEPARTMENT_OTHER)
Admission: RE | Admit: 2021-10-20 | Discharge: 2021-10-20 | Disposition: A | Discharge status: Home / Self Care | Payer: No Typology Code available for payment source | Source: Ambulatory Visit | Attending: Urology | Admitting: Urology

## 2021-10-20 ENCOUNTER — Encounter (HOSPITAL_BASED_OUTPATIENT_CLINIC_OR_DEPARTMENT_OTHER): Payer: Self-pay

## 2021-10-20 ENCOUNTER — Other Ambulatory Visit: Payer: Self-pay

## 2021-10-20 DIAGNOSIS — R310 Gross hematuria: Secondary | ICD-10-CM | POA: Diagnosis present

## 2021-10-20 MED ORDER — IOHEXOL 300 MG/ML  SOLN
120.0000 mL | Freq: Once | INTRAMUSCULAR | Status: AC | PRN
  Administered 2021-10-20: 120 mL via INTRAVENOUS

## 2021-10-21 ENCOUNTER — Ambulatory Visit (HOSPITAL_COMMUNITY): Payer: No Typology Code available for payment source

## 2021-10-21 ENCOUNTER — Encounter: Payer: Self-pay | Admitting: *Deleted

## 2021-10-24 ENCOUNTER — Encounter: Payer: Self-pay | Admitting: Internal Medicine

## 2021-10-24 ENCOUNTER — Encounter: Payer: Self-pay | Admitting: Urology

## 2021-10-26 ENCOUNTER — Encounter: Payer: Self-pay | Admitting: Urology

## 2021-10-27 ENCOUNTER — Other Ambulatory Visit: Payer: Self-pay | Admitting: Urology

## 2021-10-28 ENCOUNTER — Telehealth: Payer: Self-pay | Admitting: Internal Medicine

## 2021-10-28 ENCOUNTER — Other Ambulatory Visit: Payer: Self-pay | Admitting: Internal Medicine

## 2021-10-28 DIAGNOSIS — N62 Hypertrophy of breast: Secondary | ICD-10-CM

## 2021-10-28 NOTE — Telephone Encounter (Signed)
Pt called in stating that Dr. Derrel Nip sent him for a mammogram. Pt stated that [facility] is advising Pt that the referral wasn't signed. Pt requesting callback

## 2021-10-28 NOTE — Telephone Encounter (Signed)
Mammogram order needs to be signed so pt can schedule.

## 2021-10-29 ENCOUNTER — Encounter: Payer: Self-pay | Admitting: Urology

## 2021-10-29 ENCOUNTER — Other Ambulatory Visit: Payer: Self-pay

## 2021-10-29 ENCOUNTER — Ambulatory Visit: Payer: No Typology Code available for payment source | Admitting: Urology

## 2021-10-29 VITALS — BP 116/74 | HR 72 | Ht 66.0 in | Wt 156.0 lb

## 2021-10-29 DIAGNOSIS — N62 Hypertrophy of breast: Secondary | ICD-10-CM

## 2021-10-29 DIAGNOSIS — R310 Gross hematuria: Secondary | ICD-10-CM

## 2021-10-29 NOTE — Progress Notes (Signed)
10/29/2021 10:03 PM   Frank Cabrera December 18, 1965 [id number]  Referring provider: [name], MD [place],  [place] [address]  Chief Complaint  Patient presents with   Genital pain    HPI: Frank Cabrera is a 56 y.o. male who presents today for ED and penile skin and scrotal discoloration after an erection.     Patient had contacted [place] through [place] regarding skin discoloration of penis and scrotum with erections and the tip of his penis remaining soft during erections.    He stated he first noticed this week ago and is very concerned that when he has an erection the blood is not going through his penis but down into the scrotum.  Patient denies any modifying or aggravating factors.  Patient denies any gross hematuria, dysuria or suprapubic/flank pain.  Patient denies any fevers, chills, nausea or vomiting.    He also states that he has had issues with gynecomastia, headaches and issues with his vision.  PMH: Past Medical History:  Diagnosis Date   CAD (coronary artery disease)    GERD (gastroesophageal reflux disease)    Herpes simplex type II infection 2003   History of 2019 novel coronavirus disease (COVID-19) 03/09/2019   HLD (hyperlipidemia)    Long term current use of immunosuppressive drug    Myasthenia gravis ([place])    Myasthenia gravis associated with thymoma ([place])    Osteoporosis    Pansinusitis    PONV (postoperative nausea and vomiting)    patient also stated that toes and fingers numbness after the third day of surgery   Restless leg syndrome     Surgical History: Past Surgical History:  Procedure Laterality Date   EYE MUSCLE SURGERY  2008   for strabismus, [place],  [place]   HAND TENDON SURGERY Left 2021   JOINT REPLACEMENT Right 09/2020   hip replacement    SEPTOPLASTY  March 2013   STRABISMUS SURGERY  2006   TOTAL HIP ARTHROPLASTY Right 09/24/2020   Procedure: TOTAL HIP ARTHROPLASTY;  Surgeon: Dereck Leep, MD;   Location: [facility] ORS;  Service: Orthopedics;  Laterality: Right;   TOTAL THYMECTOMY  1995   XI ROBOTIC ASSISTED INGUINAL HERNIA REPAIR WITH MESH Right 11/06/2020   Procedure: XI ROBOTIC ASSISTED INGUINAL HERNIA REPAIR WITH MESH;  Surgeon: Olean Ree, MD;  Location: [facility] ORS;  Service: General;  Laterality: Right;    Home Medications:  Allergies as of 10/29/2021       Reactions   Cefadroxil Rash        Medication List        Accurate as of October 29, 2021 11:59 PM. If you have any questions, ask your nurse or doctor.          alendronate 35 MG tablet Commonly known as: FOSAMAX TAKE 1 TABLET BY MOUTH ONE TIME PER WEEK   ALPRAZolam 0.25 MG tablet Commonly known as: XANAX Take 1 tablet (0.25 mg total) by mouth 2 (two) times daily as needed for anxiety.   aspirin EC 81 MG tablet Take 81 mg by mouth daily. Swallow whole.   azaTHIOprine 50 MG tablet Commonly known as: IMURAN Take 150 mg by mouth daily.   CALCIUM PO Take 500 mg by mouth daily before breakfast.   Flaxseed Oil 1000 MG Caps Take 1,000 mg by mouth daily.   folic acid 1 MG tablet Commonly known as: FOLVITE Take 1 mg by mouth daily.   pramipexole 0.5 MG tablet Commonly known  as: MIRAPEX TAKE 2 TABLETS (1 MG TOTAL) BY MOUTH AT BEDTIME.   predniSONE 10 MG tablet Commonly known as: DELTASONE Take 25 mg by mouth every other day.   rosuvastatin 5 MG tablet Commonly known as: CRESTOR Take 1 tablet (5 mg total) by mouth daily.   Vitamin D (Ergocalciferol) 1.25 MG (50000 UNIT) Caps capsule Commonly known as: DRISDOL TAKE 1 CAPSULE (50,000 UNITS TOTAL) BY MOUTH EVERY 14 (FOURTEEN) DAYS.        Allergies:  Allergies  Allergen Reactions   Cefadroxil Rash    Family History: Family History  Problem Relation Age of Onset   Heart disease Mother    Heart disease Father    Arrhythmia Father    Cancer Maternal Uncle        prostate   Cancer Maternal Grandmother        colon    Social History:   reports that he has never smoked. He has never used smokeless tobacco. He reports that he does not drink alcohol and does not use drugs.  ROS: Pertinent ROS in HPI  Physical Exam: BP 116/74    Pulse 72    Ht 5\' 6"  (1.676 m)    Wt 156 lb (70.8 kg)    BMI 25.18 kg/m   Constitutional:  Well nourished. Alert and oriented, No acute distress. HEENT: [place] AT, mask in place.  Trachea midline Cardiovascular: No clubbing, cyanosis, or edema. Respiratory: Normal respiratory effort, no increased work of breathing. GU: No CVA tenderness.  No bladder fullness or masses.  Patient with circumcised phallus.  Urethral meatus is patent.  No penile discharge. No penile lesions or rashes. Scrotum without lesions, cysts, rashes and/or edema.  Testicles are located scrotally bilaterally. No masses are appreciated in the testicles. Left and right epididymis are normal. Neurologic: Grossly intact, no focal deficits, moving all 4 extremities. Psychiatric: Normal mood and affect.  Laboratory Data: Lab Results  Component Value Date   WBC 7.0 10/14/2021   HGB 13.7 10/14/2021   HCT 41.1 10/14/2021   MCV 90.2 10/14/2021   PLT 291.0 10/14/2021    Lab Results  Component Value Date   CREATININE 1.04 10/14/2021   Lab Results  Component Value Date   HGBA1C 5.4 10/16/2020    Lab Results  Component Value Date   TSH 1.38 10/16/2020        Component Value Date/Time   CHOL 149 03/10/2021 0826   CHOL 189 10/01/2015 1025   HDL 68.20 03/10/2021 0826   HDL 70 10/01/2015 1025   CHOLHDL 2 03/10/2021 0826   VLDL 12.4 03/10/2021 0826   LDLCALC 68 03/10/2021 0826   LDLCALC 104 (H) 10/01/2015 1025    Lab Results  Component Value Date   AST 20 10/14/2021   Lab Results  Component Value Date   ALT 15 10/14/2021   Urinalysis    Component Value Date/Time   COLORURINE YELLOW (A) 09/18/2020 0813   APPEARANCEUR Clear 10/08/2021 1314   LABSPEC 1.012 09/18/2020 0813   PHURINE 6.0 09/18/2020 0813   GLUCOSEU  Negative 10/08/2021 1314   HGBUR NEGATIVE 09/18/2020 0813   BILIRUBINUR Negative 10/08/2021 1314   KETONESUR NEGATIVE 09/18/2020 0813   PROTEINUR Negative 10/08/2021 1314   PROTEINUR NEGATIVE 09/18/2020 0813   NITRITE Negative 10/08/2021 1314   NITRITE NEGATIVE 09/18/2020 0813   LEUKOCYTESUR Negative 10/08/2021 1314   LEUKOCYTESUR NEGATIVE 09/18/2020 0813    I have reviewed the labs.   Pertinent Imaging: N/A  Assessment & Plan:  1. Erectile dysfunction -Explained to the patient that his exam is normal today and that there is no indication to pursue stat studies or emergent intervention -he is quite anxious and concerned about the discoloration he notices in his scrotum and erect penis -Advised him to keep his appointment with Dr. Bernardo Heater for the cystoscopy for further evaluation of the blood in the urine -I did draw prolactin level today as he states he has gynecomastia, headaches and visual changes which may be symptoms of a pituitary adenoma  Return for keep cysto appointment .  These notes generated with voice recognition software. I apologize for typographical errors.  Zara Council, PA-C  [facility] [address]  [place], [place] [address] [phone]   I spent 25 minutes on the day of the encounter to include pre-visit record review, face-to-face time with the patient, and post-visit ordering of tests.

## 2021-10-29 NOTE — Telephone Encounter (Signed)
Pt is aware that he can call over to [facility] to have scheduled now since the order has been signed.

## 2021-10-30 LAB — PROLACTIN: Prolactin: 0.4 ng/mL — ABNORMAL LOW (ref 4.0–15.2)

## 2021-11-04 ENCOUNTER — Other Ambulatory Visit: Payer: Self-pay

## 2021-11-04 ENCOUNTER — Encounter: Payer: Self-pay | Admitting: Urology

## 2021-11-04 ENCOUNTER — Ambulatory Visit: Payer: No Typology Code available for payment source | Admitting: Urology

## 2021-11-04 VITALS — BP 121/63 | HR 61 | Ht 66.0 in | Wt 153.0 lb

## 2021-11-04 DIAGNOSIS — N529 Male erectile dysfunction, unspecified: Secondary | ICD-10-CM

## 2021-11-04 DIAGNOSIS — R310 Gross hematuria: Secondary | ICD-10-CM

## 2021-11-04 LAB — URINALYSIS, COMPLETE
Bilirubin, UA: NEGATIVE
Glucose, UA: NEGATIVE
Ketones, UA: NEGATIVE
Leukocytes,UA: NEGATIVE
Nitrite, UA: NEGATIVE
Protein,UA: NEGATIVE
RBC, UA: NEGATIVE
Specific Gravity, UA: 1.02 (ref 1.005–1.030)
Urobilinogen, Ur: 0.2 mg/dL (ref 0.2–1.0)
pH, UA: 6 (ref 5.0–7.5)

## 2021-11-04 LAB — MICROSCOPIC EXAMINATION
Bacteria, UA: NONE SEEN
Epithelial Cells (non renal): NONE SEEN /hpf (ref 0–10)
RBC, Urine: NONE SEEN /hpf (ref 0–2)

## 2021-11-04 NOTE — Progress Notes (Signed)
° °  11/04/21  CC:  Chief Complaint  Patient presents with   Cysto   Indications: High risk hematuria-refer to office note 10/08/2021  HPI: Only complaint today as been decreased tumescence distal shaft.  Some hinging at this area but no curvature.  He is concerned that there is an abnormality where blood is extravasating to scrotal and penile tissues  CT urogram showed no upper tract abnormalities  Blood pressure 121/63, pulse 61, height 5\' 6"  (1.676 m), weight 153 lb (69.4 kg). NED. A&Ox3.   No respiratory distress   Abd soft, NT, ND Normal phallus with bilateral descended testicles.  No scrotal swelling or ecchymoses.  Questionable dorsal shaft plaque distally  Cystoscopy Procedure Note  Patient identification was confirmed, informed consent was obtained, and patient was prepped using Betadine solution.  Lidocaine jelly was administered per urethral meatus.     Pre-Procedure: - Inspection reveals a normal caliber urethral meatus.  Procedure: The flexible cystoscope was introduced without difficulty - No urethral strictures/lesions are present. -  Mild lateral lobe enlargement  prostate with hypervascularity - Normal bladder neck - Bilateral ureteral orifices identified - Bladder mucosa  reveals no ulcers, tumors, or lesions - No bladder stones - No trabeculation  Retroflexion shows no abnormalities   Post-Procedure: - Patient tolerated the procedure well  Assessment/ Plan: No significant abnormalities upper tracts on CTU and lower tract on cystoscopy Prominent hypervascularity prostatic urethra which may be the previous hematuria source We discussed it is not anatomically possible for there to be blood extravasating with erections.  He may have Peyronie's disease accounting for his decreased medicines distal shaft I recommended a PDE 5 inhibitor trial to see if this improves his erections He request further evaluation and men's health referral placed for [facility]   Abbie Sons, MD

## 2021-11-05 ENCOUNTER — Other Ambulatory Visit: Payer: Self-pay | Admitting: Internal Medicine

## 2021-11-05 ENCOUNTER — Encounter: Payer: Self-pay | Admitting: Internal Medicine

## 2021-11-05 ENCOUNTER — Encounter: Payer: Self-pay | Admitting: Urology

## 2021-11-05 LAB — CYTOLOGY - NON PAP

## 2021-11-06 ENCOUNTER — Other Ambulatory Visit: Payer: Self-pay

## 2021-11-06 ENCOUNTER — Encounter: Payer: Self-pay | Admitting: Internal Medicine

## 2021-11-06 ENCOUNTER — Other Ambulatory Visit: Payer: No Typology Code available for payment source

## 2021-11-06 ENCOUNTER — Ambulatory Visit: Payer: No Typology Code available for payment source | Admitting: Internal Medicine

## 2021-11-06 ENCOUNTER — Encounter: Payer: Self-pay | Admitting: *Deleted

## 2021-11-06 VITALS — BP 120/64 | HR 56 | Temp 97.9°F | Ht 66.0 in | Wt 157.2 lb

## 2021-11-06 DIAGNOSIS — M818 Other osteoporosis without current pathological fracture: Secondary | ICD-10-CM | POA: Diagnosis not present

## 2021-11-06 DIAGNOSIS — G4452 New daily persistent headache (NDPH): Secondary | ICD-10-CM | POA: Diagnosis not present

## 2021-11-06 DIAGNOSIS — R519 Headache, unspecified: Secondary | ICD-10-CM | POA: Insufficient documentation

## 2021-11-06 DIAGNOSIS — T380X5A Adverse effect of glucocorticoids and synthetic analogues, initial encounter: Secondary | ICD-10-CM

## 2021-11-06 DIAGNOSIS — N62 Hypertrophy of breast: Secondary | ICD-10-CM

## 2021-11-06 DIAGNOSIS — R7989 Other specified abnormal findings of blood chemistry: Secondary | ICD-10-CM | POA: Diagnosis not present

## 2021-11-06 NOTE — Progress Notes (Signed)
Subjective:  Patient ID: Frank Cabrera, male    DOB: 1966-03-14  Age: 56 y.o. MRN: [id number]  CC: There were no encounter diagnoses.   This visit occurred during the SARS-CoV-2 public health emergency.  Safety protocols were in place, including screening questions prior to the visit, additional usage of staff PPE, and extensive cleaning of exam room while observing appropriate contact time as indicated for disinfecting solutions.    HPI ARIAS WEINERT presents for  evaluation of and follow up on a frontal headache that has been present for 5 weeks .  Not extreme.  Wakes up daily with a frontal headache varying in intensity from 2 to 5 , improved with general rest and in rest from work,  occurs also on the weekends.  Had a formal eye exam one week and and prescription has changed.  Has strabismus causing double vision  .Marland Kitchen  Other symptoms  listed today:  tinnitus described as a faint high pitched sound in left ear.  Gynecomastia improving.  Scheduled for mammogram  Early morning throat pain.  Joint pain   Having loose stools  daily since substituting  artificial sweeteners   Cystoscopy and CT abd pelvis were normal except for BPH.  Has been referred to mens health at [facility] Complaint  Patient presents with   Headache    Headache x 5 weeks      Outpatient Medications Prior to Visit  Medication Sig Dispense Refill   alendronate (FOSAMAX) 35 MG tablet TAKE 1 TABLET BY MOUTH ONE TIME PER WEEK 12 tablet 0   ALPRAZolam (XANAX) 0.25 MG tablet Take 1 tablet (0.25 mg total) by mouth 2 (two) times daily as needed for anxiety. 30 tablet 2   aspirin EC 81 MG tablet Take 81 mg by mouth daily. Swallow whole.     azaTHIOprine (IMURAN) 50 MG tablet Take 150 mg by mouth daily.     CALCIUM PO Take 500 mg by mouth daily before breakfast.      Flaxseed, Linseed, (FLAXSEED OIL) 1000 MG CAPS Take 1,000 mg by mouth daily.     folic acid (FOLVITE) 1 MG tablet Take 1 mg by mouth daily.      pramipexole (MIRAPEX) 0.5 MG tablet TAKE 2 TABLETS (1 MG TOTAL) BY MOUTH AT BEDTIME. 180 tablet 1   predniSONE (DELTASONE) 10 MG tablet Take 25 mg by mouth every other day.     rosuvastatin (CRESTOR) 5 MG tablet Take 1 tablet (5 mg total) by mouth daily. 90 tablet 1   No facility-administered medications prior to visit.    Review of Systems;  Patient denies headache, fevers, malaise, unintentional weight loss, skin rash, eye pain, sinus congestion and sinus pain, sore throat, dysphagia,  hemoptysis , cough, dyspnea, wheezing, chest pain, palpitations, orthopnea, edema, abdominal pain, nausea, melena, diarrhea, constipation, flank pain, dysuria, hematuria, urinary  Frequency, nocturia, numbness, tingling, seizures,  Focal weakness, Loss of consciousness,  Tremor, insomnia, depression, anxiety, and suicidal ideation.      Objective:  BP 120/64 (BP Location: Left Arm, Patient Position: Sitting, Cuff Size: Normal)    Pulse (!) 56    Temp 97.9 F (36.6 C) (Oral)    Ht 5\' 6"  (1.676 m)    Wt 157 lb 3.2 oz (71.3 kg)    SpO2 98%    BMI 25.37 kg/m   BP Readings from Last 3 Encounters:  11/06/21 120/64  11/04/21 121/63  10/29/21 116/74    Wt  Readings from Last 3 Encounters:  11/06/21 157 lb 3.2 oz (71.3 kg)  11/04/21 153 lb (69.4 kg)  10/29/21 156 lb (70.8 kg)    General appearance: alert, cooperative and appears stated age Ears: normal TM's and external ear canals both ears Throat: lips, mucosa, and tongue normal; teeth and gums normal Neck: no adenopathy, no carotid bruit, supple, symmetrical, trachea midline and thyroid not enlarged, symmetric, no tenderness/mass/nodules Back: symmetric, no curvature. ROM normal. No CVA tenderness. Lungs: clear to auscultation bilaterally Heart: regular rate and rhythm, S1, S2 normal, no murmur, click, rub or gallop Abdomen: soft, non-tender; bowel sounds normal; no masses,  no organomegaly Pulses: 2+ and symmetric Skin: Skin color, texture, turgor  normal. No rashes or lesions Lymph nodes: Cervical, supraclavicular, and axillary nodes normal. Neuro:  awake and interactive with normal mood and affect. Higher cortical functions are normal. Speech is clear without word-finding difficulty or dysarthria. Extraocular movements are intact. Visual fields of both eyes are grossly intact. Sensation to light touch is grossly intact bilaterally of upper and lower extremities. Motor examination shows 4+/5 symmetric hand grip and upper extremity and 5/5 lower extremity strength. There is no pronation or drift. Gait is non-ataxic   Lab Results  Component Value Date   HGBA1C 5.4 10/16/2020    Lab Results  Component Value Date   CREATININE 1.04 10/14/2021   CREATININE 0.87 10/16/2020   CREATININE 0.83 09/18/2020    Lab Results  Component Value Date   WBC 7.0 10/14/2021   HGB 13.7 10/14/2021   HCT 41.1 10/14/2021   PLT 291.0 10/14/2021   GLUCOSE 90 10/14/2021   CHOL 149 03/10/2021   TRIG 62.0 03/10/2021   HDL 68.20 03/10/2021   LDLCALC 68 03/10/2021   ALT 15 10/14/2021   AST 20 10/14/2021   NA 138 10/14/2021   K 4.5 10/14/2021   CL 101 10/14/2021   CREATININE 1.04 10/14/2021   BUN 17 10/14/2021   CO2 30 10/14/2021   TSH 1.38 10/16/2020   INR 0.9 09/18/2020   HGBA1C 5.4 10/16/2020    CT HEMATURIA WORKUP  Result Date: 10/20/2021 CLINICAL DATA:  Intermittent hematuria for 6 months, history of blunt trauma to the penis at that time EXAM: CT ABDOMEN AND PELVIS WITHOUT AND WITH CONTRAST TECHNIQUE: Multidetector CT imaging of the abdomen and pelvis was performed following the standard protocol before and following the bolus administration of intravenous contrast. RADIATION DOSE REDUCTION: This exam was performed according to the departmental dose-optimization program which includes automated exposure control, adjustment of the mA and/or kV according to patient size and/or use of iterative reconstruction technique. CONTRAST:  133mL OMNIPAQUE  IOHEXOL 300 MG/ML  SOLN COMPARISON:  None. FINDINGS: Lower chest: No acute abnormality. Hepatobiliary: No solid liver abnormality is seen. Focal fatty deposition adjacent to the falciform ligament (series 5, image 24). No gallstones, gallbladder wall thickening, or biliary dilatation. Pancreas: Unremarkable. No pancreatic ductal dilatation or surrounding inflammatory changes. Spleen: Normal in size without significant abnormality. Adrenals/Urinary Tract: Adrenal glands are unremarkable. Kidneys are normal, without renal calculi, solid lesion, or hydronephrosis. No urinary tract filling defect on delayed phase imaging. Bladder is unremarkable. Stomach/Bowel: Stomach is within normal limits. Appendix appears normal. No evidence of bowel wall thickening, distention, or inflammatory changes. Vascular/Lymphatic: No significant vascular findings are present. No enlarged abdominal or pelvic lymph nodes. Reproductive: No mass or other significant abnormality. Other: No abdominal wall hernia or abnormality. No ascites. Musculoskeletal: No acute or significant osseous findings. Status post right hip total arthroplasty.  IMPRESSION: 1. No CT findings to explain hematuria. No evidence of urinary tract calculus, mass, hydronephrosis, or urinary tract filling defect. 2. Status post right hip total arthroplasty. Electronically Signed   By: Delanna Ahmadi M.D.   On: 10/20/2021 17:57    Assessment & Plan:   Problem List Items Addressed This Visit   None   I spent 30 minutes dedicated to the care of this patient on the date of this encounter to include pre-visit review of patient's medical history,  most recent imaging studies, Face-to-face time with the patient , and post visit ordering of testing and therapeutics.    Follow-up: No follow-ups on file.   [name], MD

## 2021-11-06 NOTE — Patient Instructions (Signed)
I'm glad the cystoscopy did not reveal a bladder cancer  The DEXA and Endocrine referrals are in process   If the headache doesn't resolve with the new glasses,   contact me and I will order an MRI with IAC's

## 2021-11-06 NOTE — Assessment & Plan Note (Signed)
Estradiol,  LD and testosterone checked by urology.  All were normal. Mammogram scheduled for Feb 17

## 2021-11-08 DIAGNOSIS — R7989 Other specified abnormal findings of blood chemistry: Secondary | ICD-10-CM | POA: Insufficient documentation

## 2021-11-08 NOTE — Assessment & Plan Note (Signed)
With occasional tinnitus, low prolactin, and recent notification of poor vision .  I headaches do not resolve after 2 weeks.will send ofr MIR brain

## 2021-11-10 ENCOUNTER — Encounter: Payer: Self-pay | Admitting: Internal Medicine

## 2021-11-10 ENCOUNTER — Encounter: Payer: Self-pay | Admitting: Urology

## 2021-11-10 DIAGNOSIS — N529 Male erectile dysfunction, unspecified: Secondary | ICD-10-CM

## 2021-11-11 ENCOUNTER — Other Ambulatory Visit: Payer: Self-pay | Admitting: *Deleted

## 2021-11-11 MED ORDER — SILDENAFIL CITRATE 100 MG PO TABS: ORAL_TABLET | ORAL | 0 refills | Status: DC

## 2021-11-11 NOTE — Telephone Encounter (Signed)
Referral has been pended.

## 2021-11-12 ENCOUNTER — Ambulatory Visit: Payer: No Typology Code available for payment source | Admitting: Urology

## 2021-11-12 ENCOUNTER — Other Ambulatory Visit: Payer: Self-pay | Admitting: Internal Medicine

## 2021-11-12 DIAGNOSIS — N62 Hypertrophy of breast: Secondary | ICD-10-CM

## 2021-11-12 DIAGNOSIS — R310 Gross hematuria: Secondary | ICD-10-CM

## 2021-11-12 DIAGNOSIS — N50819 Testicular pain, unspecified: Secondary | ICD-10-CM

## 2021-11-15 ENCOUNTER — Other Ambulatory Visit: Payer: Self-pay | Admitting: Internal Medicine

## 2021-11-15 DIAGNOSIS — N62 Hypertrophy of breast: Secondary | ICD-10-CM

## 2021-11-15 DIAGNOSIS — R7989 Other specified abnormal findings of blood chemistry: Secondary | ICD-10-CM

## 2021-11-16 ENCOUNTER — Ambulatory Visit: Payer: No Typology Code available for payment source | Admitting: Internal Medicine

## 2021-11-18 ENCOUNTER — Encounter: Payer: Self-pay | Admitting: Internal Medicine

## 2021-11-20 ENCOUNTER — Other Ambulatory Visit: Payer: No Typology Code available for payment source

## 2021-11-20 ENCOUNTER — Other Ambulatory Visit: Payer: Self-pay | Admitting: Internal Medicine

## 2021-11-20 ENCOUNTER — Inpatient Hospital Stay: Admission: RE | Admit: 2021-11-20 | Payer: No Typology Code available for payment source | Source: Ambulatory Visit

## 2021-11-20 DIAGNOSIS — H9319 Tinnitus, unspecified ear: Secondary | ICD-10-CM

## 2021-11-20 DIAGNOSIS — G4452 New daily persistent headache (NDPH): Secondary | ICD-10-CM

## 2021-11-20 DIAGNOSIS — H9012 Conductive hearing loss, unilateral, left ear, with unrestricted hearing on the contralateral side: Secondary | ICD-10-CM

## 2021-11-26 LAB — TESTOSTERONE: Testosterone: 419 ng/dL (ref 264–916)

## 2021-11-26 LAB — SPECIMEN STATUS REPORT

## 2021-11-26 LAB — LUTEINIZING HORMONE: LH: 5.8 m[IU]/mL (ref 1.7–8.6)

## 2021-11-26 LAB — ESTRADIOL: Estradiol: 12.8 pg/mL (ref 7.6–42.6)

## 2021-12-01 ENCOUNTER — Other Ambulatory Visit: Payer: Self-pay | Admitting: Cardiovascular Disease

## 2021-12-01 ENCOUNTER — Ambulatory Visit
Admission: RE | Admit: 2021-12-01 | Discharge: 2021-12-01 | Disposition: A | Discharge status: Home / Self Care | Payer: No Typology Code available for payment source | Source: Ambulatory Visit | Attending: Internal Medicine | Admitting: Internal Medicine

## 2021-12-01 DIAGNOSIS — H9012 Conductive hearing loss, unilateral, left ear, with unrestricted hearing on the contralateral side: Secondary | ICD-10-CM | POA: Diagnosis present

## 2021-12-01 DIAGNOSIS — H9319 Tinnitus, unspecified ear: Secondary | ICD-10-CM | POA: Insufficient documentation

## 2021-12-01 DIAGNOSIS — E785 Hyperlipidemia, unspecified: Secondary | ICD-10-CM

## 2021-12-01 DIAGNOSIS — G4452 New daily persistent headache (NDPH): Secondary | ICD-10-CM | POA: Diagnosis not present

## 2021-12-01 MED ORDER — GADOBUTROL 1 MMOL/ML IV SOLN
7.0000 mL | Freq: Once | INTRAVENOUS | Status: AC | PRN
  Administered 2021-12-01: 7 mL via INTRAVENOUS

## 2021-12-07 ENCOUNTER — Other Ambulatory Visit: Payer: Self-pay

## 2021-12-07 ENCOUNTER — Ambulatory Visit: Payer: No Typology Code available for payment source | Admitting: Internal Medicine

## 2021-12-07 ENCOUNTER — Encounter: Payer: Self-pay | Admitting: Internal Medicine

## 2021-12-07 VITALS — BP 100/58 | HR 58 | Temp 97.7°F | Ht 66.0 in | Wt 157.2 lb

## 2021-12-07 DIAGNOSIS — R7989 Other specified abnormal findings of blood chemistry: Secondary | ICD-10-CM

## 2021-12-07 DIAGNOSIS — R202 Paresthesia of skin: Secondary | ICD-10-CM

## 2021-12-07 DIAGNOSIS — G4486 Cervicogenic headache: Secondary | ICD-10-CM

## 2021-12-07 DIAGNOSIS — F418 Other specified anxiety disorders: Secondary | ICD-10-CM

## 2021-12-07 DIAGNOSIS — R519 Headache, unspecified: Secondary | ICD-10-CM | POA: Diagnosis not present

## 2021-12-07 DIAGNOSIS — N62 Hypertrophy of breast: Secondary | ICD-10-CM | POA: Diagnosis not present

## 2021-12-07 NOTE — Assessment & Plan Note (Addendum)
He has deferred mammogram . Prolactin level was low last month, (likely medication related/) rechecking .  Endocrinology referral in progress,  Advised to get mammogram for completeness sake ?

## 2021-12-07 NOTE — Progress Notes (Signed)
Subjective:  Patient ID: Frank Cabrera, male    DOB: [birth year]  Age: 56 y.o. MRN: [id number]  CC: The primary encounter diagnosis was Cervicogenic headache. Diagnoses of Gynecomastia, Chronic daily headache, Tingling of both upper extremities, Anxiety about health, and Low serum prolactin were also pertinent to this visit.   This visit occurred during the SARS-CoV-2 public health emergency.  Safety protocols were in place, including screening questions prior to the visit, additional usage of staff PPE, and extensive cleaning of exam room while observing appropriate contact time as indicated for disinfecting solutions.    HPI Frank Cabrera presents for follow up on multiple somatic complaints  Chief Complaint  Patient presents with   Follow-up    1 month follow up    1) Headaches, tinnitus:  first headache was Dec 23 ,  so painful he thought he had suffered a SAH.  Also was having intermittent hematuria,  becamee concerned that he has testicular CA with mets to the brain.  Saw Stoioff,  exam normal.  Also reported gynecomastia to urologist , screening labs normal,  prolactin unexpectedly LOW.  Saw me in early January with multiple concerns.  Mammogram ordered but thus far deferred by patient.   MRI with IACs done to rule out mass,  schwannoma.  MRI normal.  Headache has only  improved, still occurring daily.  Glaucoma ruled out with formal eye exam and prescriptions lens update.   Recently told by his dentist that he needs a root canal on the left.    Headache  starts at the crown .   Better on the weekends.   Worse after work occurs daily starts at the top of the head, and migrates to the frontal lobes and behind the eyes.  Pressure quality.  Occurring daily .  Better on the weekends,  not aggravated by playing tennis.  No history of neck pain until recently.  Cervical spine films were normal  jan 2022 , done to evaluated bilateral arm numbness .  Currently has episodes of axillary tingling.  Remains  concerned about an occult malignancy    2)  still having  gynecomastia:  endocrinology appt pending (referral made last month)  3)  has urology appt at [facility] (referred by stoioff)   Outpatient Medications Prior to Visit  Medication Sig Dispense Refill   alendronate (FOSAMAX) 35 MG tablet TAKE 1 TABLET BY MOUTH ONE TIME PER WEEK 12 tablet 0   ALPRAZolam (XANAX) 0.25 MG tablet Take 1 tablet (0.25 mg total) by mouth 2 (two) times daily as needed for anxiety. 30 tablet 2   amoxicillin-clavulanate (AUGMENTIN) 875-125 MG tablet SMARTSIG:1 Tablet(s) By Mouth Every 12 Hours     aspirin EC 81 MG tablet Take 81 mg by mouth daily. Swallow whole.     azaTHIOprine (IMURAN) 50 MG tablet Take 150 mg by mouth daily.     CALCIUM PO Take 500 mg by mouth daily before breakfast.      Flaxseed, Linseed, (FLAXSEED OIL) 1000 MG CAPS Take 1,000 mg by mouth daily.     folic acid (FOLVITE) 1 MG tablet Take 1 mg by mouth daily.     pramipexole (MIRAPEX) 0.5 MG tablet TAKE 2 TABLETS (1 MG TOTAL) BY MOUTH AT BEDTIME. 180 tablet 1   predniSONE (DELTASONE) 10 MG tablet Take 25 mg by mouth every other day.     rosuvastatin (CRESTOR) 5 MG tablet TAKE 1 TABLET (5 MG TOTAL) BY MOUTH DAILY. 90 tablet 2   sildenafil (  VIAGRA) 100 MG tablet Take 1 tab 1 hour prior to intercourse 10 tablet 0   No facility-administered medications prior to visit.    Review of Systems;  Patient denies headache, fevers, malaise, unintentional weight loss, skin rash, eye pain, sinus congestion and sinus pain, sore throat, dysphagia,  hemoptysis , cough, dyspnea, wheezing, chest pain, palpitations, orthopnea, edema, abdominal pain, nausea, melena, diarrhea, constipation, flank pain, dysuria, hematuria, urinary  Frequency, nocturia, numbness, tingling, seizures,  Focal weakness, Loss of consciousness,  Tremor, insomnia, depression, anxiety, and suicidal ideation.      Objective:  BP (!) 100/58 (BP Location: Left Arm, Patient Position: Sitting, Cuff  Size: Normal)    Pulse (!) 58    Temp 97.7 F (36.5 C) (Oral)    Ht '5\' 6"'$  (1.676 m)    Wt 157 lb 3.2 oz (71.3 kg)    SpO2 97%    BMI 25.37 kg/m   BP Readings from Last 3 Encounters:  12/07/21 (!) 100/58  11/06/21 120/64  11/04/21 121/63    Wt Readings from Last 3 Encounters:  12/07/21 157 lb 3.2 oz (71.3 kg)  11/06/21 157 lb 3.2 oz (71.3 kg)  11/04/21 153 lb (69.4 kg)    General appearance: alert, cooperative and appears stated age Ears: normal TM's and external ear canals both ears Throat: lips, mucosa, and tongue normal; teeth and gums normal Neck: no adenopathy, no carotid bruit, supple, symmetrical, trachea midline and thyroid not enlarged, symmetric, no tenderness/mass/nodules Back: symmetric, no curvature. ROM normal. No CVA tenderness. Lungs: clear to auscultation bilaterally Heart: regular rate and rhythm, S1, S2 normal, no murmur, click, rub or gallop Abdomen: soft, non-tender; bowel sounds normal; no masses,  no organomegaly Pulses: 2+ and symmetric Skin: Skin color, texture, turgor normal. No rashes or lesions Lymph nodes: Cervical, supraclavicular, and axillary nodes normal.  Lab Results  Component Value Date   HGBA1C 5.4 10/16/2020    Lab Results  Component Value Date   CREATININE 1.04 10/14/2021   CREATININE 0.87 10/16/2020   CREATININE 0.83 09/18/2020    Lab Results  Component Value Date   WBC 7.0 10/14/2021   HGB 13.7 10/14/2021   HCT 41.1 10/14/2021   PLT 291.0 10/14/2021   GLUCOSE 90 10/14/2021   CHOL 149 03/10/2021   TRIG 62.0 03/10/2021   HDL 68.20 03/10/2021   LDLCALC 68 03/10/2021   ALT 15 10/14/2021   AST 20 10/14/2021   NA 138 10/14/2021   K 4.5 10/14/2021   CL 101 10/14/2021   CREATININE 1.04 10/14/2021   BUN 17 10/14/2021   CO2 30 10/14/2021   TSH 1.38 10/16/2020   INR 0.9 09/18/2020   HGBA1C 5.4 10/16/2020    MR BRAIN/IAC W WO CONTRAST  Result Date: 12/02/2021 CLINICAL DATA:  Persistent headaches > 4 weeks, tinnitus EXAM: MRI  HEAD WITHOUT AND WITH CONTRAST TECHNIQUE: Multiplanar, multiecho pulse sequences of the brain and surrounding structures were obtained without and with intravenous contrast. CONTRAST:  65m GADAVIST GADOBUTROL 1 MMOL/ML IV SOLN COMPARISON:  None. FINDINGS: Brain: There is no cerebellopontine angle mass. Inner ear structures demonstrate an unremarkable MR appearance. There is no abnormal enhancement within the internal auditory canals. No acute infarction or intracranial hemorrhage. There is no mass effect or edema. There is no extra-axial fluid collection. Vascular: Major vessel flow voids at the skull base are preserved. Skull and upper cervical spine: Normal marrow signal is preserved. Sinuses/Orbits: Paranasal sinuses are clear. The orbits are unremarkable. Other: The sella is unremarkable.  Mastoid  air cells are clear. IMPRESSION: No evidence of recent infarction, hemorrhage, or mass. No abnormal enhancement. Electronically Signed   By: Macy Mis M.D.   On: 12/02/2021 15:26    Assessment & Plan:   Problem List Items Addressed This Visit     Anxiety about health    He remains unconvinced that he does not have CA.  Referrals to multiple specialists in progress.  Defers SSRI therapy      Chronic daily headache - Primary    He continues to report a daily headache that starts at the crown and moves to the frontal area and behind right eye.  There is no vision  Change,  Tearing,  Or stabbing quality to suggest cluster headache.  He likely now has a medication rebound complicating he picture since he has taken ibuprofen daily since December.  He declines Elavil trial.  MRI brain done due to concurrent tinnitus and concern for metastasis of unknown primary. Normal.  Referring to Dr Manuella Ghazi       Relevant Orders   Ambulatory referral to Neurology   Gynecomastia    He has deferred mammogram . Prolactin level was low last month, (likely medication related/) rechecking .  Endocrinology referral in  progress,  Advised to get mammogram for completeness sake      Relevant Orders   Prolactin   Low serum prolactin   Tingling of both upper extremities    Screening labs to be repeated..  (symptoms were worked up last year and Cervical spine films not suggestive of significant disk disease).  Given his recent hip surgery less than one month ago,  Advised to continue surveillance and referring to neurology       Relevant Orders   Ambulatory referral to Neurology   B12 and Folate Panel   Thyroid Panel With TSH    I spent 40 minutes dedicated to the care of this patient on the date of this encounter to include pre-visit review of patient's medical history,  most recent imaging studies, Face-to-face time with the patient , and post visit ordering of testing and therapeutics.    Follow-up: No follow-ups on file.   [name], MD

## 2021-12-07 NOTE — Assessment & Plan Note (Addendum)
He continues to report a daily headache that starts at the crown and moves to the frontal area and behind right eye.  There is no vision  Change,  Tearing,  Or stabbing quality to suggest cluster headache.  He likely now has a medication rebound complicating he picture since he has taken ibuprofen daily since December.  He declines Elavil trial.  MRI brain done due to concurrent tinnitus and concern for metastasis of unknown primary. Normal.  Referring to Dr Manuella Ghazi  ?

## 2021-12-07 NOTE — Patient Instructions (Addendum)
I am making a referral to Dr Vickii Penna  at [facility] to evaluate the headache and tingling in your underarms. ? ? ?The referral to Dr Cruzita Lederer (Endocrinology) was made in February for evaluation of the gynecomastia.  I recommend that you reschedule the mammogram  so that all of the information is available to her.  ? ?If you would like to try the amitriptyline while you wait, let me know and I will send the medication to your pharmacy  ?

## 2021-12-08 NOTE — Assessment & Plan Note (Signed)
He remains unconvinced that he does not have CA.  Referrals to multiple specialists in progress.  Defers SSRI therapy ?

## 2021-12-08 NOTE — Assessment & Plan Note (Signed)
Screening labs to be repeated..  (symptoms were worked up last year and Cervical spine films not suggestive of significant disk disease).  Given his recent hip surgery less than one month ago,  Advised to continue surveillance and referring to neurology  ?

## 2021-12-15 ENCOUNTER — Other Ambulatory Visit: Payer: Self-pay

## 2021-12-15 ENCOUNTER — Other Ambulatory Visit (INDEPENDENT_AMBULATORY_CARE_PROVIDER_SITE_OTHER): Payer: No Typology Code available for payment source

## 2021-12-15 DIAGNOSIS — G4486 Cervicogenic headache: Secondary | ICD-10-CM

## 2021-12-15 DIAGNOSIS — N62 Hypertrophy of breast: Secondary | ICD-10-CM

## 2021-12-15 DIAGNOSIS — R202 Paresthesia of skin: Secondary | ICD-10-CM

## 2021-12-15 LAB — B12 AND FOLATE PANEL
Folate: >24.2 ng/mL (ref >5.9)
Vitamin B-12: 587 pg/mL (ref 211–911)

## 2021-12-15 LAB — CORTISOL: Cortisol, Plasma: 14.1 ug/dL

## 2021-12-16 LAB — THYROID PANEL WITH TSH
Free Thyroxine Index: 2.2 (ref 1.4–3.8)
T3 Uptake: 33 % (ref 22–35)
T4, Total: 6.6 ug/dL (ref 4.9–10.5)
TSH: 2.2 mIU/L (ref 0.40–4.50)

## 2021-12-16 LAB — PROLACTIN: Prolactin: 1.2 ng/mL — ABNORMAL LOW (ref 2.0–18.0)

## 2021-12-28 ENCOUNTER — Other Ambulatory Visit: Payer: Self-pay

## 2021-12-28 ENCOUNTER — Ambulatory Visit
Admission: RE | Admit: 2021-12-28 | Discharge: 2021-12-28 | Disposition: A | Discharge status: Home / Self Care | Payer: No Typology Code available for payment source | Source: Ambulatory Visit | Attending: Internal Medicine | Admitting: Internal Medicine

## 2021-12-28 DIAGNOSIS — N62 Hypertrophy of breast: Secondary | ICD-10-CM | POA: Insufficient documentation

## 2022-01-08 ENCOUNTER — Other Ambulatory Visit: Payer: Self-pay | Admitting: Internal Medicine

## 2022-01-31 IMAGING — DX DG CERVICAL SPINE COMPLETE 4+V
5 series · 5 of 5 positions shown · non-contrast
Comparison: April 18, 2015

CLINICAL DATA: Cervicalgia with upper extremity numbness and
tingling

EXAM:
CERVICAL SPINE - COMPLETE 4+ VIEW

[cervical spine ap]
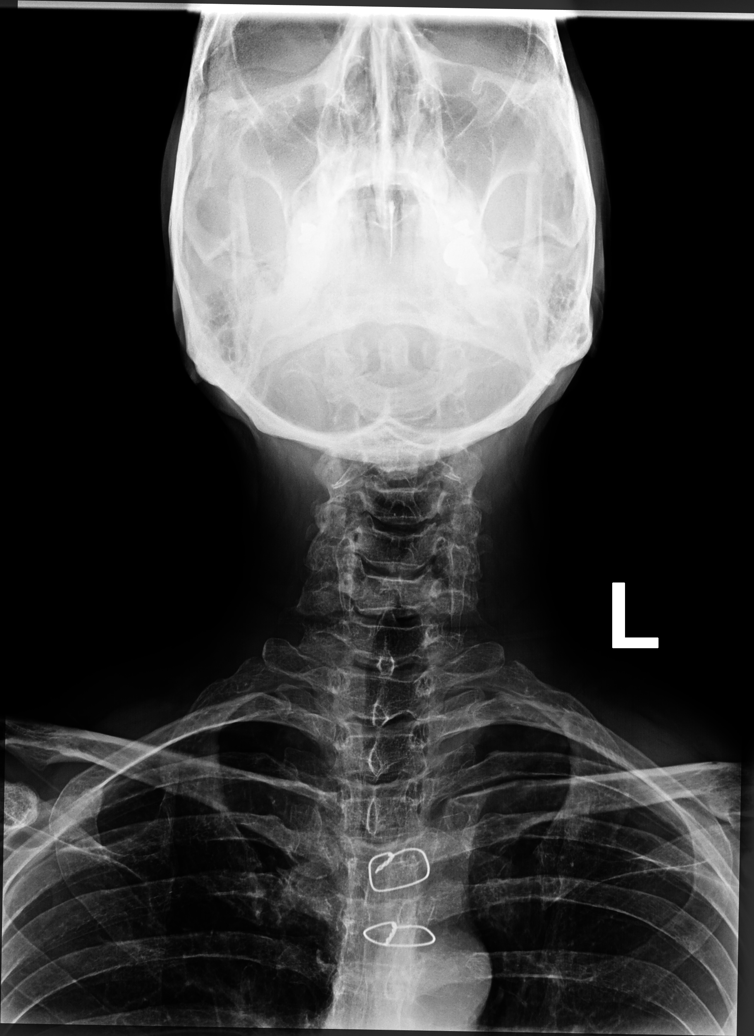

[cervical spine oblique]
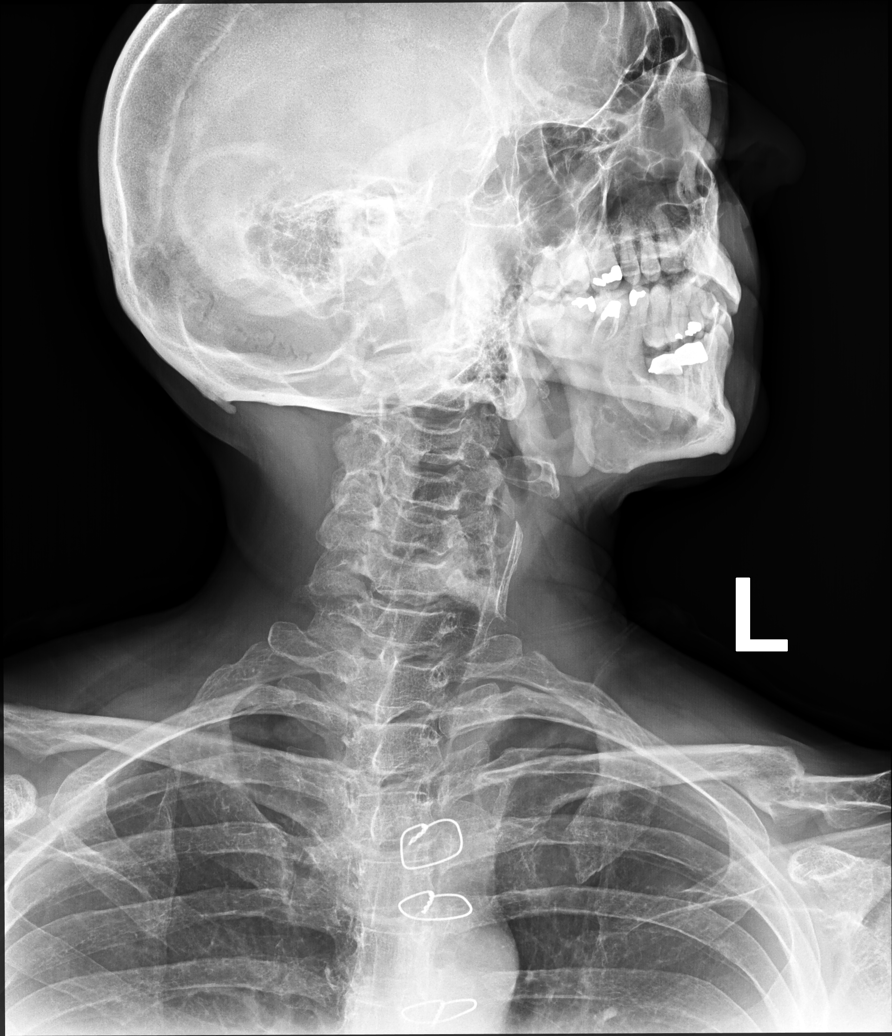

[cervical spine lat]
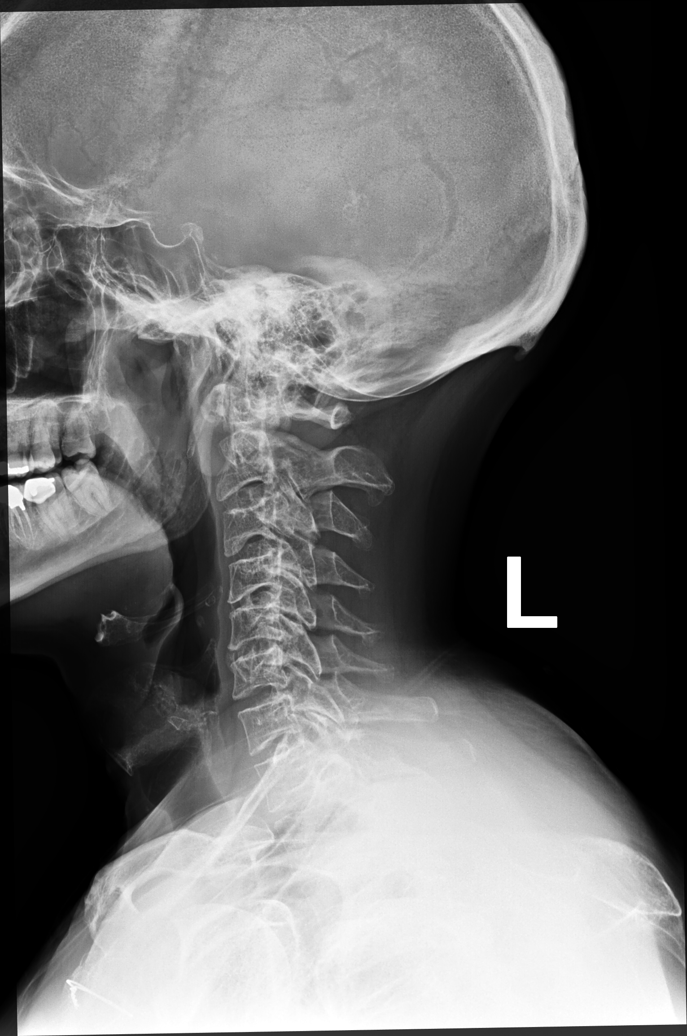

[swimmers lat]
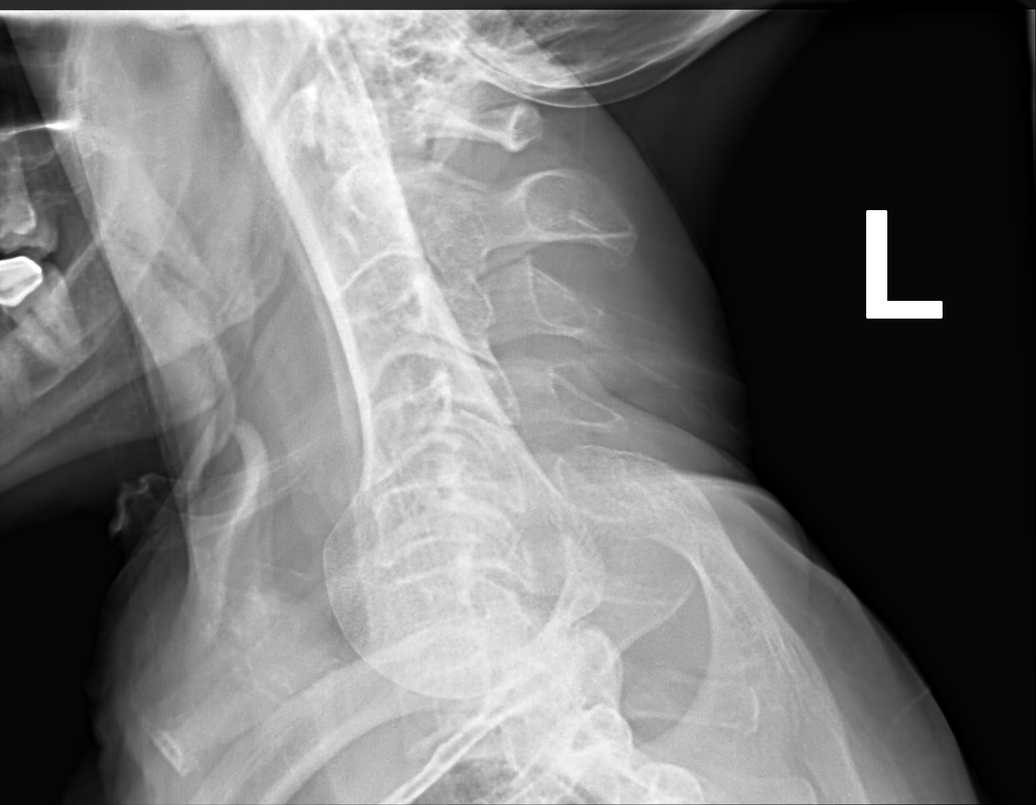

[cervical spine open mouth ap]
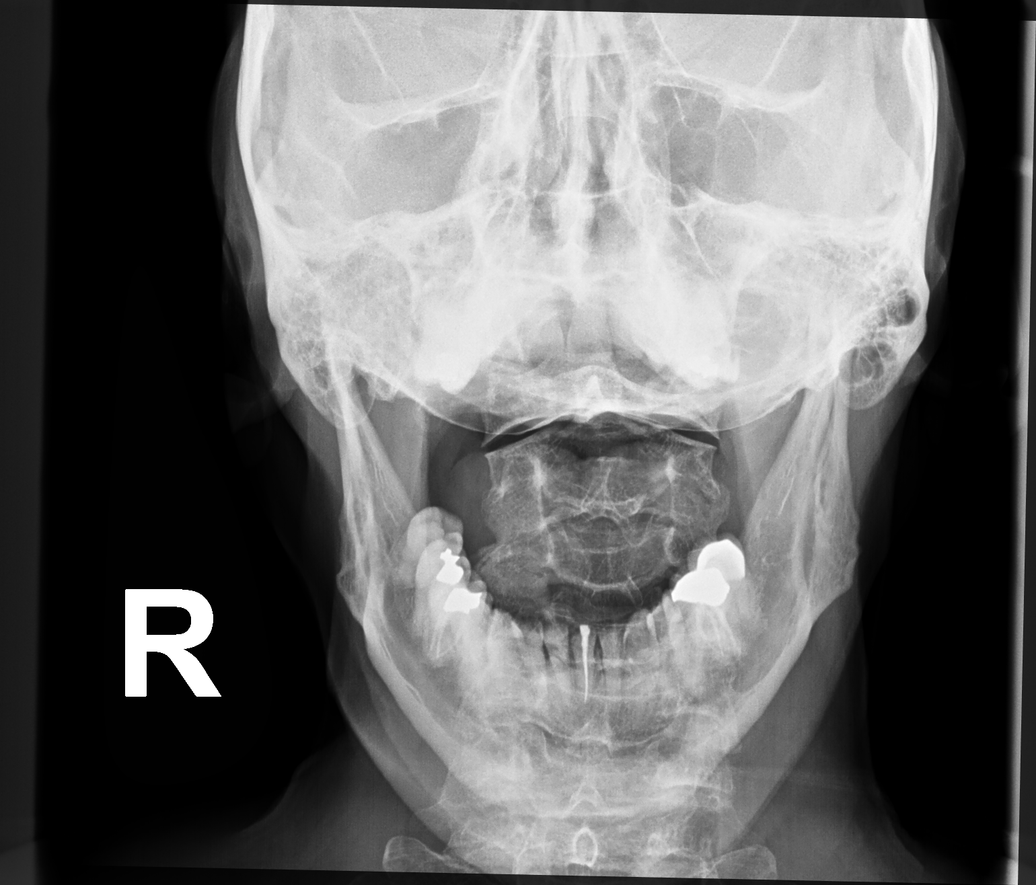

[5 of 5 positions shown; findings below may reference images not displayed]

FINDINGS: Frontal, lateral, open-mouth odontoid, and bilateral oblique views
were obtained. There is no fracture or spondylolisthesis.
Prevertebral soft tissues and predental space regions are normal.
There is no appreciable disc space narrowing. No appreciable facet
bony overgrowth. Lung apices clear. Status post median sternotomy.
IMPRESSION: No fracture or spondylolisthesis.  No appreciable arthropathy.

## 2022-04-08 ENCOUNTER — Other Ambulatory Visit: Payer: Self-pay | Admitting: Internal Medicine

## 2022-05-04 ENCOUNTER — Other Ambulatory Visit: Payer: Self-pay | Admitting: Internal Medicine

## 2022-06-27 ENCOUNTER — Encounter: Payer: Self-pay | Admitting: Internal Medicine

## 2022-06-29 ENCOUNTER — Other Ambulatory Visit: Payer: Self-pay | Admitting: Internal Medicine

## 2022-06-29 DIAGNOSIS — Z79899 Other long term (current) drug therapy: Secondary | ICD-10-CM

## 2022-06-29 DIAGNOSIS — E7800 Pure hypercholesterolemia, unspecified: Secondary | ICD-10-CM

## 2022-06-29 DIAGNOSIS — E559 Vitamin D deficiency, unspecified: Secondary | ICD-10-CM

## 2022-06-29 NOTE — Telephone Encounter (Signed)
Printed and placed in quick sign folder 

## 2022-06-29 NOTE — Telephone Encounter (Signed)
Tracey from [place] called stating she need bone density order faxed-[phone]

## 2022-06-30 ENCOUNTER — Other Ambulatory Visit: Payer: No Typology Code available for payment source

## 2022-07-02 ENCOUNTER — Other Ambulatory Visit (INDEPENDENT_AMBULATORY_CARE_PROVIDER_SITE_OTHER): Payer: No Typology Code available for payment source

## 2022-07-02 DIAGNOSIS — Z79899 Other long term (current) drug therapy: Secondary | ICD-10-CM | POA: Diagnosis not present

## 2022-07-02 DIAGNOSIS — E7800 Pure hypercholesterolemia, unspecified: Secondary | ICD-10-CM

## 2022-07-02 DIAGNOSIS — E559 Vitamin D deficiency, unspecified: Secondary | ICD-10-CM

## 2022-07-02 LAB — COMPREHENSIVE METABOLIC PANEL
ALT: 14 U/L (ref 0–53)
AST: 17 U/L (ref 0–37)
Albumin: 4 g/dL (ref 3.5–5.2)
Alkaline Phosphatase: 37 U/L — ABNORMAL LOW (ref 39–117)
BUN: 14 mg/dL (ref 6–23)
CO2: 29 mEq/L (ref 19–32)
Calcium: 8.9 mg/dL (ref 8.4–10.5)
Chloride: 106 mEq/L (ref 96–112)
Creatinine, Ser: 0.98 mg/dL (ref 0.40–1.50)
GFR: 87.75 mL/min (ref >60.00)
Glucose, Bld: 89 mg/dL (ref 70–99)
Potassium: 4 mEq/L (ref 3.5–5.1)
Sodium: 142 mEq/L (ref 135–145)
Total Bilirubin: 0.4 mg/dL (ref 0.2–1.2)
Total Protein: 6.3 g/dL (ref 6.0–8.3)

## 2022-07-02 LAB — CBC WITH DIFFERENTIAL/PLATELET
Basophils Absolute: 0.1 10*3/uL (ref 0.0–0.1)
Basophils Relative: 0.8 % (ref 0.0–3.0)
Eosinophils Absolute: 0.1 10*3/uL (ref 0.0–0.7)
Eosinophils Relative: 1.6 % (ref 0.0–5.0)
HCT: 41.1 % (ref 39.0–52.0)
Hemoglobin: 14 g/dL (ref 13.0–17.0)
Lymphocytes Relative: 24.7 % (ref 12.0–46.0)
Lymphs Abs: 1.6 10*3/uL (ref 0.7–4.0)
MCHC: 34.1 g/dL (ref 30.0–36.0)
MCV: 92.3 fl (ref 78.0–100.0)
Monocytes Absolute: 0.6 10*3/uL (ref 0.1–1.0)
Monocytes Relative: 9.7 % (ref 3.0–12.0)
Neutro Abs: 4 10*3/uL (ref 1.4–7.7)
Neutrophils Relative %: 63.2 % (ref 43.0–77.0)
Platelets: 255 10*3/uL (ref 150.0–400.0)
RBC: 4.45 Mil/uL (ref 4.22–5.81)
RDW: 14.7 % (ref 11.5–15.5)
WBC: 6.4 10*3/uL (ref 4.0–10.5)

## 2022-07-02 LAB — LIPID PANEL
Cholesterol: 146 mg/dL (ref 0–200)
HDL: 68.3 mg/dL (ref >39.00)
LDL Cholesterol: 67 mg/dL (ref 0–99)
NonHDL: 77.46
Total CHOL/HDL Ratio: 2
Triglycerides: 51 mg/dL (ref 0.0–149.0)
VLDL: 10.2 mg/dL (ref 0.0–40.0)

## 2022-07-02 LAB — VITAMIN D 25 HYDROXY (VIT D DEFICIENCY, FRACTURES): VITD: 42.6 ng/mL (ref 30.00–100.00)

## 2022-07-03 ENCOUNTER — Encounter: Payer: Self-pay | Admitting: Internal Medicine

## 2022-07-03 DIAGNOSIS — R350 Frequency of micturition: Secondary | ICD-10-CM

## 2022-07-05 NOTE — Telephone Encounter (Signed)
Spoke with pt and scheduled him for a lab appt and a video visit with Dr. Derrel Nip later in the week.

## 2022-07-06 ENCOUNTER — Encounter: Payer: Self-pay | Admitting: Internal Medicine

## 2022-07-06 ENCOUNTER — Other Ambulatory Visit: Payer: No Typology Code available for payment source

## 2022-07-08 NOTE — Telephone Encounter (Signed)
noted 

## 2022-07-09 ENCOUNTER — Ambulatory Visit: Payer: No Typology Code available for payment source | Admitting: Internal Medicine

## 2022-07-12 ENCOUNTER — Other Ambulatory Visit: Payer: Self-pay | Admitting: Internal Medicine

## 2022-07-15 LAB — HM DEXA SCAN

## 2022-07-16 ENCOUNTER — Ambulatory Visit: Payer: No Typology Code available for payment source | Admitting: Cardiovascular Disease

## 2022-07-19 ENCOUNTER — Other Ambulatory Visit (INDEPENDENT_AMBULATORY_CARE_PROVIDER_SITE_OTHER): Payer: No Typology Code available for payment source

## 2022-07-19 DIAGNOSIS — R350 Frequency of micturition: Secondary | ICD-10-CM | POA: Diagnosis not present

## 2022-07-19 LAB — URINALYSIS, ROUTINE W REFLEX MICROSCOPIC
Bilirubin Urine: NEGATIVE
Hgb urine dipstick: NEGATIVE
Ketones, ur: NEGATIVE
Leukocytes,Ua: NEGATIVE
Nitrite: NEGATIVE
Specific Gravity, Urine: 1.015 (ref 1.000–1.030)
Total Protein, Urine: NEGATIVE
Urine Glucose: NEGATIVE
Urobilinogen, UA: 0.2 (ref 0.0–1.0)
pH: 6 (ref 5.0–8.0)

## 2022-07-20 LAB — URINE CULTURE
MICRO NUMBER:: 14055515
Result:: NO GROWTH
SPECIMEN QUALITY:: ADEQUATE

## 2022-07-22 ENCOUNTER — Encounter: Payer: Self-pay | Admitting: Internal Medicine

## 2022-07-22 ENCOUNTER — Encounter: Payer: No Typology Code available for payment source | Admitting: Internal Medicine

## 2022-07-22 ENCOUNTER — Telehealth: Payer: No Typology Code available for payment source | Admitting: Internal Medicine

## 2022-07-22 ENCOUNTER — Ambulatory Visit: Payer: No Typology Code available for payment source | Admitting: Internal Medicine

## 2022-07-22 DIAGNOSIS — R519 Headache, unspecified: Secondary | ICD-10-CM | POA: Diagnosis not present

## 2022-07-22 DIAGNOSIS — K2900 Acute gastritis without bleeding: Secondary | ICD-10-CM

## 2022-07-22 DIAGNOSIS — R350 Frequency of micturition: Secondary | ICD-10-CM

## 2022-07-22 DIAGNOSIS — K2970 Gastritis, unspecified, without bleeding: Secondary | ICD-10-CM | POA: Insufficient documentation

## 2022-07-22 MED ORDER — OMEPRAZOLE 20 MG PO CPDR: 20.0000 mg | DELAYED_RELEASE_CAPSULE | Freq: Every day | ORAL | 3 refills | Status: DC

## 2022-07-22 NOTE — Progress Notes (Signed)
Virtual Visit via [place]   Note    This format is felt to be most appropriate for this patient at this time.  All issues noted in this document were discussed and addressed.  No physical exam was performed (except for noted visual exam findings with Video Visits).   I connected with Frank Cabrera  on 07/22/22 at 12:00 PM EDT by a video enabled telemedicine application and verified that I am speaking with the correct person using two identifiers. Location patient: home Location provider: work or home office Persons participating in the virtual visit: patient, provider  I discussed the limitations, risks, security and privacy concerns of performing an evaluation and management service by telephone and the availability of in person appointments. I also discussed with the patient that there may be a patient responsible charge related to this service. The patient expressed understanding and agreed to proceed.  Reason for visit: 1) urinary frequency 2) gastritis   HPI:  56 yr old male with CAD, Myasthenis gravis, Osteoporosis presents for evaluation of recent episode of cloudy urine  and frequent urination.  Symptoms were presents seeral weeks ago,  increased his water intake andn symptoms have resolved  2_ Also having abd pain  6 or 6 times daily.  3/10  relieved by eating.  No change in Bowel  habits, no weight loss.  Has been taking  advil daily for management of daily tension type headaches.  Not taking PPI    ROS: See pertinent positives and negatives per HPI.  Past Medical History:  Diagnosis Date   CAD (coronary artery disease)    GERD (gastroesophageal reflux disease)    Herpes simplex type II infection 2003   History of 2019 novel coronavirus disease (COVID-19) 03/09/2019   HLD (hyperlipidemia)    Long term current use of immunosuppressive drug    Myasthenia gravis ([place])    Myasthenia gravis associated with thymoma ([place])    Osteoporosis    Pansinusitis    PONV (postoperative nausea  and vomiting)    patient also stated that toes and fingers numbness after the third day of surgery   Restless leg syndrome     Past Surgical History:  Procedure Laterality Date   EYE MUSCLE SURGERY  2008   for strabismus, [place],  Board   HAND TENDON SURGERY Left 2021   JOINT REPLACEMENT Right 09/2020   hip replacement    SEPTOPLASTY  March 2013   STRABISMUS SURGERY  2006   TOTAL HIP ARTHROPLASTY Right 09/24/2020   Procedure: TOTAL HIP ARTHROPLASTY;  Surgeon: Dereck Leep, MD;  Location: [facility] ORS;  Service: Orthopedics;  Laterality: Right;   TOTAL THYMECTOMY  1995   XI ROBOTIC ASSISTED INGUINAL HERNIA REPAIR WITH MESH Right 11/06/2020   Procedure: XI ROBOTIC ASSISTED INGUINAL HERNIA REPAIR WITH MESH;  Surgeon: Olean Ree, MD;  Location: [facility] ORS;  Service: General;  Laterality: Right;    Family History  Problem Relation Age of Onset   Heart disease Mother    Heart disease Father    Arrhythmia Father    Cancer Maternal Uncle        prostate   Cancer Maternal Grandmother        colon    SOCIAL HX:  reports that he has never smoked. He has never used smokeless tobacco. He reports that he does not drink alcohol and does not use drugs.    Current Outpatient Medications:    omeprazole (PRILOSEC) 20 MG capsule, Take 1 capsule (20 mg total) by  mouth daily., Disp: 30 capsule, Rfl: 3   alendronate (FOSAMAX) 35 MG tablet, TAKE 1 TABLET BY MOUTH ONE TIME PER WEEK, Disp: 12 tablet, Rfl: 0   ALPRAZolam (XANAX) 0.25 MG tablet, Take 1 tablet (0.25 mg total) by mouth 2 (two) times daily as needed for anxiety. (Patient not taking: Reported on 07/22/2022), Disp: 30 tablet, Rfl: 2   amoxicillin-clavulanate (AUGMENTIN) 875-125 MG tablet, SMARTSIG:1 Tablet(s) By Mouth Every 12 Hours (Patient not taking: Reported on 07/22/2022), Disp: , Rfl:    aspirin EC 81 MG tablet, Take 81 mg by mouth daily. Swallow whole., Disp: , Rfl:    azaTHIOprine (IMURAN) 50 MG tablet, Take 150 mg by mouth daily.,  Disp: , Rfl:    CALCIUM PO, Take 500 mg by mouth daily before breakfast. , Disp: , Rfl:    Flaxseed, Linseed, (FLAXSEED OIL) 1000 MG CAPS, Take 1,000 mg by mouth daily., Disp: , Rfl:    folic acid (FOLVITE) 1 MG tablet, Take 1 mg by mouth daily., Disp: , Rfl:    pramipexole (MIRAPEX) 0.5 MG tablet, TAKE 2 TABLETS (1 MG TOTAL) BY MOUTH EVERY DAY AT BEDTIME, Disp: 180 tablet, Rfl: 1   predniSONE (DELTASONE) 10 MG tablet, Take 25 mg by mouth every other day., Disp: , Rfl:    rosuvastatin (CRESTOR) 5 MG tablet, TAKE 1 TABLET (5 MG TOTAL) BY MOUTH DAILY., Disp: 90 tablet, Rfl: 2   sildenafil (VIAGRA) 100 MG tablet, Take 1 tab 1 hour prior to intercourse (Patient not taking: Reported on 07/22/2022), Disp: 10 tablet, Rfl: 0  EXAM:  VITALS per patient if applicable:  GENERAL: alert, oriented, appears well and in no acute distress  HEENT: atraumatic, conjunttiva clear, no obvious abnormalities on inspection of external nose and ears  NECK: normal movements of the head and neck  LUNGS: on inspection no signs of respiratory distress, breathing rate appears normal, no obvious gross SOB, gasping or wheezing  CV: no obvious cyanosis  MS: moves all visible extremities without noticeable abnormality  PSYCH/NEURO: pleasant and cooperative, no obvious depression or anxiety, speech and thought processing grossly intact  ASSESSMENT AND PLAN:  Discussed the following assessment and plan:  Acute gastritis, presence of bleeding unspecified, unspecified gastritis type  Chronic daily headache  Urinary frequency  Gastritis Suggested by history of mild abd pain and daily NSAID use  Advised to stop NSAIDs and start omeprazole 20 mg daily . Follow up in 2-4 weeks    Chronic daily headache Workup done by e and Dr Manuella Ghazi non diagnostic. History of improvement with massage suggestive of tension headaches.   Advised to suspend daily use of NSAIDs,   Use tylenol and employ massage, heat as needed   Urinary  frequency There is no sign of UTI .  Follow up with his urologist if symptoms continue    I discussed the assessment and treatment plan with the patient. The patient was provided an opportunity to ask questions and all were answered. The patient agreed with the plan and demonstrated an understanding of the instructions.   The patient was advised to call back or seek an in-person evaluation if the symptoms worsen or if the condition fails to improve as anticipated.   I spent 30 minutes dedicated to the care of this patient on the date of this encounter to include pre-visit review of his medical history,  Face-to-face time with the patient , and post visit ordering of testing and therapeutics.    Frank Mc, MD

## 2022-07-22 NOTE — Assessment & Plan Note (Signed)
There is no sign of UTI .  Follow up with his urologist if symptoms continue

## 2022-07-22 NOTE — Assessment & Plan Note (Signed)
Workup done by e and Dr Manuella Ghazi non diagnostic. History of improvement with massage suggestive of tension headaches.   Advised to suspend daily use of NSAIDs,   Use tylenol and employ massage, heat as needed

## 2022-07-22 NOTE — Assessment & Plan Note (Signed)
Suggested by history of mild abd pain and daily NSAID use  Advised to stop NSAIDs and start omeprazole 20 mg daily . Follow up in 2-4 weeks

## 2022-08-10 ENCOUNTER — Other Ambulatory Visit: Payer: Self-pay | Admitting: Cardiovascular Disease

## 2022-08-10 DIAGNOSIS — E785 Hyperlipidemia, unspecified: Secondary | ICD-10-CM

## 2022-08-10 NOTE — Telephone Encounter (Signed)
Patient scheduled to see Dr. Fletcher Anon on 11/9. Overdue for appointment. Last seen 7/22. Will you please refill during appt if appropriate? Thank you!

## 2022-08-12 ENCOUNTER — Ambulatory Visit: Payer: No Typology Code available for payment source | Admitting: Cardiovascular Disease

## 2022-08-16 ENCOUNTER — Other Ambulatory Visit: Payer: Self-pay | Admitting: Cardiovascular Disease

## 2022-08-16 DIAGNOSIS — E785 Hyperlipidemia, unspecified: Secondary | ICD-10-CM

## 2022-09-07 ENCOUNTER — Other Ambulatory Visit: Payer: Self-pay | Admitting: Cardiovascular Disease

## 2022-09-07 ENCOUNTER — Encounter: Payer: Self-pay | Admitting: Cardiovascular Disease

## 2022-09-07 DIAGNOSIS — E785 Hyperlipidemia, unspecified: Secondary | ICD-10-CM

## 2022-09-08 MED ORDER — ROSUVASTATIN CALCIUM 5 MG PO TABS: 5.0000 mg | ORAL_TABLET | Freq: Every day | ORAL | 0 refills | Status: DC

## 2022-09-14 NOTE — Progress Notes (Signed)
This encounter was created in error - please disregard.

## 2022-09-15 NOTE — Telephone Encounter (Signed)
MyChart messgae sent to patient. 

## 2022-10-13 ENCOUNTER — Other Ambulatory Visit: Payer: Self-pay | Admitting: Internal Medicine

## 2022-10-28 ENCOUNTER — Encounter: Payer: Self-pay | Admitting: Internal Medicine

## 2022-10-29 ENCOUNTER — Other Ambulatory Visit: Payer: Self-pay

## 2022-10-29 MED ORDER — PRAMIPEXOLE DIHYDROCHLORIDE 0.5 MG PO TABS: ORAL_TABLET | ORAL | 1 refills | Status: DC

## 2022-11-04 ENCOUNTER — Ambulatory Visit: Payer: No Typology Code available for payment source | Admitting: Cardiovascular Disease

## 2022-11-09 ENCOUNTER — Ambulatory Visit
Payer: No Typology Code available for payment source | Attending: Cardiovascular Disease | Admitting: Cardiovascular Disease

## 2022-11-09 ENCOUNTER — Encounter: Payer: Self-pay | Admitting: Cardiovascular Disease

## 2022-11-09 VITALS — BP 100/70 | HR 55 | Ht 66.0 in | Wt 156.5 lb

## 2022-11-09 DIAGNOSIS — E785 Hyperlipidemia, unspecified: Secondary | ICD-10-CM | POA: Diagnosis not present

## 2022-11-09 DIAGNOSIS — M79673 Pain in unspecified foot: Secondary | ICD-10-CM | POA: Diagnosis not present

## 2022-11-09 DIAGNOSIS — G7000 Myasthenia gravis without (acute) exacerbation: Secondary | ICD-10-CM | POA: Diagnosis not present

## 2022-11-09 DIAGNOSIS — R931 Abnormal findings on diagnostic imaging of heart and coronary circulation: Secondary | ICD-10-CM

## 2022-11-09 NOTE — Patient Instructions (Signed)
Medication Instructions:  No changes *If you need a refill on your cardiac medications before your next appointment, please call your pharmacy*   Lab Work: None ordered If you have labs (blood work) drawn today and your tests are completely normal, you will receive your results only by: [place] (if you have MyChart) OR A paper copy in the mail If you have any lab test that is abnormal or we need to change your treatment, we will call you to review the results.   Testing/Procedures: Your physician has requested that you have a lower extremity segmental doppler.   Follow-Up: At [facility], you and your health needs are our priority.  As part of our continuing mission to provide you with exceptional heart care, we have created designated Provider Care Teams.  These Care Teams include your primary Cardiologist (physician) and Advanced Practice Providers (APPs -  Physician Assistants and Nurse Practitioners) who all work together to provide you with the care you need, when you need it.  We recommend signing up for the patient portal called "MyChart".  Sign up information is provided on this After Visit Summary.  MyChart is used to connect with patients for Virtual Visits (Telemedicine).  Patients are able to view lab/test results, encounter notes, upcoming appointments, etc.  Non-urgent messages can be sent to your provider as well.   To learn more about what you can do with MyChart, go to [email or web address].    Your next appointment:   12 month(s)  Provider:   You may see Dr. Fletcher Anon or one of the following Advanced Practice Providers on your designated Care Team:   Murray Hodgkins, NP Christell Faith, PA-C Cadence Kathlen Mody, PA-C Gerrie Nordmann, NP

## 2022-11-09 NOTE — Progress Notes (Signed)
Cardiology Office Note   Date:  11/09/2022   ID:  Frank Cabrera, DOB October 06, 1965, MRN [id number]  PCP:  [name], MD  Cardiologist:   Kathlyn Sacramento, MD   Chief Complaint  Patient presents with   Other    12 month f/u no complaints today. Meds reviewed verbally with pt.      History of Present Illness: Frank Cabrera is a 57 y.o. male who is here today for follow-up visit regarding atypical chest pain and mild coronary atherosclerosis noted on cardiac CTA.   He has known history of myasthenia gravis currently being treated with Imuran and prednisone. He has been on steroids for many years. He is not a smoker. There is family history of atrial fibrillation but no coronary artery disease.  Due to recurrent atypical chest pain, he underwent an echocardiogram in February of this 2021 which showed normal LV systolic function,no evidence of pericardial effusion and no significant valvular abnormalities.   CTA of the coronary arteries in February 2021 showed a calcium score of 73 which was in the 11 percentile for age and sex matched group control.  There was mild calcification in the proximal LAD causing mild stenosis.    He has been doing well with no chest pain or shortness of breath.  He complains of numbness and cold sensation in both feet especially at night.  No calf claudication.  Past Medical History:  Diagnosis Date   CAD (coronary artery disease)    GERD (gastroesophageal reflux disease)    Herpes simplex type II infection 2003   History of 2019 novel coronavirus disease (COVID-19) 03/09/2019   HLD (hyperlipidemia)    Long term current use of immunosuppressive drug    Myasthenia gravis ([place])    Myasthenia gravis associated with thymoma ([place])    Osteoporosis    Pansinusitis    PONV (postoperative nausea and vomiting)    patient also stated that toes and fingers numbness after the third day of surgery   Restless leg syndrome     Past Surgical History:  Procedure  Laterality Date   EYE MUSCLE SURGERY  2008   for strabismus, [place],  Board   HAND TENDON SURGERY Left 2021   JOINT REPLACEMENT Right 09/2020   hip replacement    SEPTOPLASTY  March 2013   STRABISMUS SURGERY  2006   TOTAL HIP ARTHROPLASTY Right 09/24/2020   Procedure: TOTAL HIP ARTHROPLASTY;  Surgeon: Dereck Leep, MD;  Location: [facility] ORS;  Service: Orthopedics;  Laterality: Right;   TOTAL THYMECTOMY  1995   XI ROBOTIC ASSISTED INGUINAL HERNIA REPAIR WITH MESH Right 11/06/2020   Procedure: XI ROBOTIC ASSISTED INGUINAL HERNIA REPAIR WITH MESH;  Surgeon: Olean Ree, MD;  Location: [facility] ORS;  Service: General;  Laterality: Right;     Current Outpatient Medications  Medication Sig Dispense Refill   alendronate (FOSAMAX) 35 MG tablet TAKE 1 TABLET BY MOUTH ONE TIME PER WEEK 12 tablet 1   aspirin EC 81 MG tablet Take 81 mg by mouth daily. Swallow whole.     azaTHIOprine (IMURAN) 50 MG tablet Take 150 mg by mouth daily.     CALCIUM PO Take 500 mg by mouth daily before breakfast.      Flaxseed, Linseed, (FLAXSEED OIL) 1000 MG CAPS Take 1,000 mg by mouth daily.     folic acid (FOLVITE) 1 MG tablet Take 1 mg by mouth daily.     pramipexole (MIRAPEX) 0.5 MG tablet TAKE 2 TABLETS (1  MG TOTAL) BY MOUTH EVERY DAY AT BEDTIME 180 tablet 1   predniSONE (DELTASONE) 10 MG tablet Take 15 mg by mouth every other day.     rosuvastatin (CRESTOR) 5 MG tablet Take 1 tablet (5 mg total) by mouth daily. 90 tablet 0   No current facility-administered medications for this visit.    Allergies:   Cefadroxil    Social History:  The patient  reports that he has never smoked. He has never used smokeless tobacco. He reports that he does not drink alcohol and does not use drugs.   Family History:  The patient's family history includes Arrhythmia in his father; Cancer in his maternal grandmother and maternal uncle; Heart disease in his father and mother.    ROS:  Please see the history of present illness.    Otherwise, review of systems are positive for none.   All other systems are reviewed and negative.    PHYSICAL EXAM: VS:  BP 100/70 (BP Location: Left Arm, Patient Position: Sitting, Cuff Size: Normal)   Pulse (!) 55   Ht '5\' 6"'$  (1.676 m)   Wt 156 lb 8 oz (71 kg)   SpO2 99%   BMI 25.26 kg/m  , BMI Body mass index is 25.26 kg/m. GEN: Well nourished, well developed, in no acute distress  HEENT: normal  Neck: no JVD, carotid bruits, or masses Cardiac: RRR; no murmurs, rubs, or gallops,no edema  Respiratory:  clear to auscultation bilaterally, normal work of breathing GI: soft, nontender, nondistended, + BS MS: no deformity or atrophy  Skin: warm and dry, no rash Neuro:  Strength and sensation are intact Psych: euthymic mood, full affect Vascular: Dorsalis pedis is +1 on the right and nonpalpable on the left.  Posterior tibial is +2 on the right and +1 on the left.   EKG:  EKG is ordered today. EKG showed sinus bradycardia with no significant ST or T wave changes.   Recent Labs: 12/15/2021: TSH 2.20 07/02/2022: ALT 14; BUN 14; Creatinine, Ser 0.98; Hemoglobin 14.0; Platelets 255.0; Potassium 4.0; Sodium 142    Lipid Panel    Component Value Date/Time   CHOL 146 07/02/2022 0748   CHOL 189 10/01/2015 1025   TRIG 51.0 07/02/2022 0748   HDL 68.30 07/02/2022 0748   HDL 70 10/01/2015 1025   CHOLHDL 2 07/02/2022 0748   VLDL 10.2 07/02/2022 0748   LDLCALC 67 07/02/2022 0748   LDLCALC 104 (H) 10/01/2015 1025      Wt Readings from Last 3 Encounters:  11/09/22 156 lb 8 oz (71 kg)  07/22/22 157 lb 3.2 oz (71.3 kg)  12/07/21 157 lb 3.2 oz (71.3 kg)       ASSESSMENT AND PLAN:  1.  Elevated coronary calcium score:  The patient is known to have mildly elevated calcium score with mild proximal LAD stenosis.  Currently with no anginal symptoms.   Continue low-dose aspirin and rosuvastatin.  2.   Hyperlipidemia: I reviewed most recent lipid profile done in September which showed  an LDL of 67 which is at target.  Continue small dose rosuvastatin.  The patient is interested in coming off rosuvastatin by improving his lifestyle.  I do recommend a target LDL of less than 70 and we will have to see if he is able to achieve that without medications.  3.  Atypical feet discomfort: He complains of cold sensation and numbness.  His vascular exam is mildly abnormal with diminished pulses especially on the left side.  I requested  lower extremity arterial Doppler.  4. Myasthenia gravis: Symptoms are controlled with Imuran and prednisone.    Disposition:   FU with me in 1 year  Signed,  Kathlyn Sacramento, MD  11/09/2022 4:59 PM    [place]

## 2022-11-13 ENCOUNTER — Other Ambulatory Visit: Payer: Self-pay | Admitting: Internal Medicine

## 2022-12-23 ENCOUNTER — Ambulatory Visit: Payer: No Typology Code available for payment source

## 2023-01-17 ENCOUNTER — Encounter: Payer: Self-pay | Admitting: Internal Medicine

## 2023-01-17 MED ORDER — PRAMIPEXOLE DIHYDROCHLORIDE 0.5 MG PO TABS: ORAL_TABLET | ORAL | 1 refills | Status: DC

## 2023-02-20 ENCOUNTER — Other Ambulatory Visit: Payer: Self-pay | Admitting: Cardiovascular Disease

## 2023-02-20 DIAGNOSIS — E785 Hyperlipidemia, unspecified: Secondary | ICD-10-CM

## 2023-03-17 ENCOUNTER — Ambulatory Visit: Payer: 59 | Admitting: Nurse Practitioner

## 2023-03-22 ENCOUNTER — Encounter: Payer: Self-pay | Admitting: Nurse Practitioner

## 2023-03-22 ENCOUNTER — Ambulatory Visit
Admission: RE | Admit: 2023-03-22 | Discharge: 2023-03-22 | Disposition: A | Discharge status: Home / Self Care | Payer: 59 | Source: Ambulatory Visit | Attending: Nurse Practitioner | Admitting: Nurse Practitioner

## 2023-03-22 ENCOUNTER — Ambulatory Visit (INDEPENDENT_AMBULATORY_CARE_PROVIDER_SITE_OTHER): Payer: 59 | Admitting: Nurse Practitioner

## 2023-03-22 VITALS — BP 102/58 | HR 70 | Temp 97.8°F | Ht 66.0 in | Wt 156.8 lb

## 2023-03-22 DIAGNOSIS — M549 Dorsalgia, unspecified: Secondary | ICD-10-CM | POA: Insufficient documentation

## 2023-03-22 NOTE — Progress Notes (Signed)
Established Patient Office Visit  Subjective:  Patient ID: Frank Cabrera, male    DOB: 01-12-1966  Age: 57 y.o. MRN: [id number]  CC:  Chief Complaint  Patient presents with   Back Pain    X 6 months     HPI  Frank Cabrera presents for:  Back Pain This is a chronic problem. Episode onset: 6 months. The problem occurs intermittently (daily: intermittently). The problem has been gradually worsening since onset. Pain location: middle of the spine. The quality of the pain is described as aching. The pain does not radiate. The pain is at a severity of 4/10. The pain is Worse during the day. The symptoms are aggravated by standing. Associated symptoms include tingling (in toes bilaterally). Pertinent negatives include no abdominal pain, headaches, leg pain, numbness or weakness. He has tried home exercises (streching) for the symptoms. The treatment provided mild relief.     Past Medical History:  Diagnosis Date   CAD (coronary artery disease)    GERD (gastroesophageal reflux disease)    Herpes simplex type II infection 2003   History of 2019 novel coronavirus disease (COVID-19) 03/09/2019   HLD (hyperlipidemia)    Long term current use of immunosuppressive drug    Myasthenia gravis ([facility])    Myasthenia gravis associated with thymoma ([facility])    Osteoporosis    Pansinusitis    PONV (postoperative nausea and vomiting)    patient also stated that toes and fingers numbness after the third day of surgery   Restless leg syndrome     Past Surgical History:  Procedure Laterality Date   EYE MUSCLE SURGERY  2008   for strabismus, [place],  Board   HAND TENDON SURGERY Left 2021   JOINT REPLACEMENT Right 09/2020   hip replacement    SEPTOPLASTY  March 2013   STRABISMUS SURGERY  2006   TOTAL HIP ARTHROPLASTY Right 09/24/2020   Procedure: TOTAL HIP ARTHROPLASTY;  Surgeon: Donato Heinz, MD;  Location: [facility] OR[facility];  Service: Orthopedics;  Laterality: Right;   TOTAL THYMECTOMY  1995   XI  ROBOTIC ASSISTED INGUINAL HERNIA REPAIR WITH MESH Right 11/06/2020   Procedure: XI ROBOTIC ASSISTED INGUINAL HERNIA REPAIR WITH MESH;  Surgeon: Henrene Dodge, MD;  Location: [facility] OR[facility];  Service: General;  Laterality: Right;    Family History  Problem Relation Age of Onset   Heart disease Mother    Heart disease Father    Arrhythmia Father    Cancer Maternal Uncle        prostate   Cancer Maternal Grandmother        colon    Social History   Socioeconomic History   Marital status: Married    Spouse name: Not on file   Number of children: Not on file   Years of education: Not on file   Highest education level: Not on file  Occupational History    Employer: [employer]  Tobacco Use   Smoking status: Never   Smokeless tobacco: Never  Vaping Use   Vaping Use: Never used  Substance and Sexual Activity   Alcohol use: No   Drug use: No   Sexual activity: Yes  Other Topics Concern   Not on file  Social History Narrative   Not on file   Social Determinants of Health   Financial Resource Strain: Not on file  Food Insecurity: Not on file  Transportation Needs: Not on file  Physical Activity: Not on file  Stress: Not on file  Social Connections: Not on file  Intimate Partner Violence: Not on file     Outpatient Medications Prior to Visit  Medication Sig Dispense Refill   alendronate (FOSAMAX) 35 MG tablet TAKE 1 TABLET BY MOUTH ONE TIME PER WEEK 12 tablet 1   aspirin EC 81 MG tablet Take 81 mg by mouth daily. Swallow whole.     azaTHIOprine (IMURAN) 50 MG tablet Take 150 mg by mouth daily.     CALCIUM PO Take 500 mg by mouth daily before breakfast.      Flaxseed, Linseed, (FLAXSEED OIL) 1000 MG CAPS Take 1,000 mg by mouth daily.     folic acid (FOLVITE) 1 MG tablet Take 1 mg by mouth daily.     pramipexole (MIRAPEX) 0.5 MG tablet TAKE 2 TABLETS (1 MG TOTAL) BY MOUTH EVERY DAY AT BEDTIME 180 tablet 1   predniSONE (DELTASONE) 10 MG tablet Take 15 mg by mouth  every other day.     rosuvastatin (CRESTOR) 5 MG tablet TAKE 1 TABLET (5 MG TOTAL) BY MOUTH DAILY. 90 tablet 2   No facility-administered medications prior to visit.    Allergies  Allergen Reactions   Cefadroxil Rash    ROS Review of Systems  Gastrointestinal:  Negative for abdominal pain.  Musculoskeletal:  Positive for back pain.  Neurological:  Positive for tingling (in toes bilaterally). Negative for weakness, numbness and headaches.   Negative unless indicated in HPI.    Objective:    Physical Exam Constitutional:      Appearance: Normal appearance.  HENT:     Right Ear: Tympanic membrane normal. Tympanic membrane is not erythematous.     Left Ear: Tympanic membrane normal. Tympanic membrane is not erythematous.     Nose:     Right Turbinates: Not enlarged.     Left Turbinates: Not enlarged.     Right Sinus: No maxillary sinus tenderness or frontal sinus tenderness.     Left Sinus: No maxillary sinus tenderness or frontal sinus tenderness.     Mouth/Throat:     Mouth: Mucous membranes are moist.     Pharynx: No pharyngeal swelling, oropharyngeal exudate or posterior oropharyngeal erythema.     Tonsils: No tonsillar exudate.  Cardiovascular:     Rate and Rhythm: Normal rate and regular rhythm.  Pulmonary:     Effort: Pulmonary effort is normal.     Breath sounds: Normal breath sounds. No stridor. No wheezing.  Musculoskeletal:        General: Tenderness present.  Neurological:     General: No focal deficit present.     Mental Status: He is alert and oriented to person, place, and time. Mental status is at baseline.  Psychiatric:        Mood and Affect: Mood normal.        Behavior: Behavior normal.        Thought Content: Thought content normal.        Judgment: Judgment normal.     BP (!) 102/58   Pulse 70   Temp 97.8 F (36.6 C) (Oral)   Ht 5\' 6"  (1.676 m)   Wt 156 lb 12.8 oz (71.1 kg)   SpO2 99%   BMI 25.31 kg/m  Wt Readings from Last 3  Encounters:  03/22/23 156 lb 12.8 oz (71.1 kg)  11/09/22 156 lb 8 oz (71 kg)  07/22/22 157 lb 3.2 oz (71.3 kg)     Health Maintenance  Topic Date Due   Colonoscopy  Never done  COVID-19 Vaccine (6 - 2023-24 season) 09/19/2022   Zoster Vaccines- Shingrix (2 of 2) 11/04/2022   INFLUENZA VACCINE  05/05/2023   DTaP/Tdap/Td (2 - Td or Tdap) 10/16/2030   Hepatitis C Screening  Completed   HIV Screening  Completed   HPV VACCINES  Aged Out    There are no preventive care reminders to display for this patient.  Lab Results  Component Value Date   TSH 2.20 12/15/2021   Lab Results  Component Value Date   WBC 6.4 07/02/2022   HGB 14.0 07/02/2022   HCT 41.1 07/02/2022   MCV 92.3 07/02/2022   PLT 255.0 07/02/2022   Lab Results  Component Value Date   NA 142 07/02/2022   K 4.0 07/02/2022   CO2 29 07/02/2022   GLUCOSE 89 07/02/2022   BUN 14 07/02/2022   CREATININE 0.98 07/02/2022   BILITOT 0.4 07/02/2022   ALKPHOS 37 (L) 07/02/2022   AST 17 07/02/2022   ALT 14 07/02/2022   PROT 6.3 07/02/2022   ALBUMIN 4.0 07/02/2022   CALCIUM 8.9 07/02/2022   ANIONGAP 7 09/18/2020   GFR 87.75 07/02/2022   Lab Results  Component Value Date   CHOL 146 07/02/2022   Lab Results  Component Value Date   HDL 68.30 07/02/2022   Lab Results  Component Value Date   LDLCALC 67 07/02/2022   Lab Results  Component Value Date   TRIG 51.0 07/02/2022   Lab Results  Component Value Date   CHOLHDL 2 07/02/2022   Lab Results  Component Value Date   HGBA1C 5.4 10/16/2020      Assessment & Plan:  Spine pain Assessment & Plan: He is followed by the neurologist for lumbar related neuropathy. EMG procedure at [facility] on 03/07/2023.  He has chronic right L4-5 radiculopathy. X-ray pending.  Referral sent to neurosurgeon.    Orders: -     DG Lumbar Spine 2-3 Views; Future -     Ambulatory referral to Spine Surgery    Follow-up: No follow-ups on file.   Kara Dies, NP

## 2023-04-02 DIAGNOSIS — M549 Dorsalgia, unspecified: Secondary | ICD-10-CM | POA: Insufficient documentation

## 2023-04-02 NOTE — Assessment & Plan Note (Signed)
He is followed by the neurologist for lumbar related neuropathy. EMG procedure at [facility] on 03/07/2023.  He has chronic right L4-5 radiculopathy. X-ray pending.  Referral sent to neurosurgeon.

## 2023-04-12 NOTE — Progress Notes (Deleted)
Referring Physician:  Sherlene Shams, MD [address] [place],  [place] [address]  Primary Physician:  Sherlene Shams, MD  History of Present Illness: 04/12/2023 Mr. Frank Cabrera is here today with a chief complaint of ***  Duration: *** Location: *** Quality: *** Severity: ***  Precipitating: aggravated by *** Modifying factors: made better by *** Weakness: none Timing: *** Bowel/Bladder Dysfunction: none  Conservative measures:  Physical therapy: ***  Multimodal medical therapy including regular antiinflammatories: ***  Injections: *** epidural steroid injections  Past Surgery: ***  JARAD KINCHELOE has ***no symptoms of cervical myelopathy.  The symptoms are causing a significant impact on the patient's life.   I have utilized the care everywhere function in epic to review the outside records available from external health systems.  Review of Systems:  A 10 point review of systems is negative, except for the pertinent positives and negatives detailed in the HPI.  Past Medical History: Past Medical History:  Diagnosis Date   CAD (coronary artery disease)    GERD (gastroesophageal reflux disease)    Herpes simplex type II infection 2003   History of 2019 novel coronavirus disease (COVID-19) 03/09/2019   HLD (hyperlipidemia)    Long term current use of immunosuppressive drug    Myasthenia gravis (HCC)    Myasthenia gravis associated with thymoma (HCC)    Osteoporosis    Pansinusitis    PONV (postoperative nausea and vomiting)    patient also stated that toes and fingers numbness after the third day of surgery   Restless leg syndrome     Past Surgical History: Past Surgical History:  Procedure Laterality Date   EYE MUSCLE SURGERY  2008   for strabismus, [place],  Board   HAND TENDON SURGERY Left 2021   JOINT REPLACEMENT Right 09/2020   hip replacement    SEPTOPLASTY  March 2013   STRABISMUS SURGERY  2006   TOTAL HIP ARTHROPLASTY Right  09/24/2020   Procedure: TOTAL HIP ARTHROPLASTY;  Surgeon: Donato Heinz, MD;  Location: [facility] ORS;  Service: Orthopedics;  Laterality: Right;   TOTAL THYMECTOMY  1995   XI ROBOTIC ASSISTED INGUINAL HERNIA REPAIR WITH MESH Right 11/06/2020   Procedure: XI ROBOTIC ASSISTED INGUINAL HERNIA REPAIR WITH MESH;  Surgeon: Henrene Dodge, MD;  Location: [facility] ORS;  Service: General;  Laterality: Right;    Allergies: Allergies as of 04/13/2023 - Review Complete 03/22/2023  Allergen Reaction Noted   Cefadroxil Rash 04/26/2012    Medications:  Current Outpatient Medications:    alendronate (FOSAMAX) 35 MG tablet, TAKE 1 TABLET BY MOUTH ONE TIME PER WEEK, Disp: 12 tablet, Rfl: 1   aspirin EC 81 MG tablet, Take 81 mg by mouth daily. Swallow whole., Disp: , Rfl:    azaTHIOprine (IMURAN) 50 MG tablet, Take 150 mg by mouth daily., Disp: , Rfl:    CALCIUM PO, Take 500 mg by mouth daily before breakfast. , Disp: , Rfl:    Flaxseed, Linseed, (FLAXSEED OIL) 1000 MG CAPS, Take 1,000 mg by mouth daily., Disp: , Rfl:    folic acid (FOLVITE) 1 MG tablet, Take 1 mg by mouth daily., Disp: , Rfl:    pramipexole (MIRAPEX) 0.5 MG tablet, TAKE 2 TABLETS (1 MG TOTAL) BY MOUTH EVERY DAY AT BEDTIME, Disp: 180 tablet, Rfl: 1   predniSONE (DELTASONE) 10 MG tablet, Take 15 mg by mouth every other day., Disp: , Rfl:    rosuvastatin (CRESTOR) 5 MG tablet, TAKE 1 TABLET (5 MG  TOTAL) BY MOUTH DAILY., Disp: 90 tablet, Rfl: 2  Social History: Social History   Tobacco Use   Smoking status: Never   Smokeless tobacco: Never  Vaping Use   Vaping Use: Never used  Substance Use Topics   Alcohol use: No   Drug use: No    Family Medical History: Family History  Problem Relation Age of Onset   Heart disease Mother    Heart disease Father    Arrhythmia Father    Cancer Maternal Uncle        prostate   Cancer Maternal Grandmother        colon    Physical Examination: There were no vitals filed for this  visit.  General: Patient is in no apparent distress. Attention to examination is appropriate.  Neck:   Supple.  Full range of motion.  Respiratory: Patient is breathing without any difficulty.   NEUROLOGICAL:     Awake, alert, oriented to person, place, and time.  Speech is clear and fluent.   Cranial Nerves: Pupils equal round and reactive to light.  Facial tone is symmetric.  Facial sensation is symmetric. Shoulder shrug is symmetric. Tongue protrusion is midline.    Strength: Side Biceps Triceps Deltoid Interossei Grip Wrist Ext. Wrist Flex.  R 5 5 5 5 5 5 5   L 5 5 5 5 5 5 5    Side Iliopsoas Quads Hamstring PF DF EHL  R 5 5 5 5 5 5   L 5 5 5 5 5 5    Reflexes are ***2+ and symmetric at the biceps, triceps, brachioradialis, patella and achilles.   Hoffman's is absent. Clonus is absent  Bilateral upper and lower extremity sensation is intact to light touch ***.     No evidence of dysmetria noted.  Gait is normal.    Imaging: ***  Electrodiagnostics: Final Report [facility] DEPARTMENT OF NEUROLOGY NEUROMUSCULAR DIVISION ELECTROMYOGRAPHY LABORATORY   Name: [name] Age: 57 years Date of Study: 03/07/2023  2:36:23 PM MRN: [id number] Height: 5\' 5"  Ref. Provider: Evangeline Gula Sex at Birth: Male Weight: 156 lbs. Ref. Source:   Clinical Impression: 57 year old male with history of myasthenia gravis presents for evaluation of bilateral sensation of feeling cold in his toes.  This started about 4 months ago symmetrically in both his feet not been progressing and there is no associated radiating low back pain.    On focused neurological examination full strength in bilateral lower extremities except mild EHL weakness on the right absent ankle reflexes and intact sensation to pinprick.   Specific Questions: Assess for peripheral neuropathy.  ***** THIS IS A TEXTUAL REPORT. REFER TO PDF FOR TABLE DATA *****  Summary: NCS &  EMG: NCS Studies were performed after warming limbs to 34C with limb temperature maintained at 32-34C throughout testing. 1. Right sural, superficial sensory and ulnar digit sensory responses were normal. 2. Right fibular-EDB motor response was absent. 3. Right tibial motor response showed reduced amplitude at the knee with normal F-wave latency. 4. Left ulnar motor response was normal including F-wave latency.  EMG Concentric needle examination of selected muscles in the right upper and lower extremity was performed.  There was no abnormal spontaneous activity in the muscles tested.  Right tibialis anterior and vastus lateralis showed few large amplitude polyphasic MUAP upon activation with normal recruitment.  All the remainder of the muscles were normal.    Conclusion: This is an abnormal study. There is electrodiagnostic evidence of a chronic right L4-5  radiculopathy without any evidence of active ongoing denervation. There is no electrodiagnostic evidence of a large fiber peripheral neuropathy.  Study Participants: Technician: Carolyne Fiscal, CNCT Resident/Fellow: Jeannie Done, M.B.B.S. Attending Physician: [place] Mayberry, M.D.  Electronically signed by [place] Mayberry, M.D. on 03/07/2023 at 5:08:09 PM    I have personally reviewed the images and agree with the above interpretation.  Medical Decision Making/Assessment and Plan: Mr. Sorich is a pleasant 57 y.o. male with ***  There are no diagnoses linked to this encounter.   Thank you for involving me in the care of this patient.    Lovenia Kim MD/MSCR Neurosurgery

## 2023-04-13 ENCOUNTER — Ambulatory Visit: Payer: 59 | Admitting: Neurosurgery

## 2023-04-14 ENCOUNTER — Encounter: Payer: Self-pay | Admitting: Internal Medicine

## 2023-04-22 NOTE — Progress Notes (Unsigned)
Referring Physician:  Sherlene Shams, MD [address] [place],  [place] [address]  Primary Physician:  Sherlene Shams, MD  History of Present Illness: 04/27/2023 Frank Cabrera is here today with a chief complaint of neck and mid back pain.  He also had coldness in his bilateral toes.  He states that this started approximately 6 months ag he is unsure how it started but thankfully has gotten significantly better.  Up to 90%.  He stated that his neck pain would radiate into his right shoulder.  Did notice that when he was having significant pain if he was able to stretch himself out that he felt better.  He does not have any upper extremity numbness weakness or tingling.  Has only cold sensation he noticed was in the tips of his toes but this has since improved.  Not having any bowel or bladder dysfunction.  Physical therapy: None Multimodal medical therapy including regular antiinflammatories:  tylenol, Advil Injections: No epidural steroid injections  Past Surgery: No  Frank Cabrera has no symptoms of cervical myelopathy.  I have utilized the care everywhere function in epic to review the outside records available from external health systems.  Review of Systems:  A 10 point review of systems is negative, except for the pertinent positives and negatives detailed in the HPI.  Past Medical History: Past Medical History:  Diagnosis Date   CAD (coronary artery disease)    GERD (gastroesophageal reflux disease)    Herpes simplex type II infection 2003   History of 2019 novel coronavirus disease (COVID-19) 03/09/2019   HLD (hyperlipidemia)    Long term current use of immunosuppressive drug    Myasthenia gravis (HCC)    Myasthenia gravis associated with thymoma (HCC)    Osteoporosis    Pansinusitis    PONV (postoperative nausea and vomiting)    patient also stated that toes and fingers numbness after the third day of surgery   Restless leg syndrome     Past  Surgical History: Past Surgical History:  Procedure Laterality Date   EYE MUSCLE SURGERY  2008   for strabismus, [place],  Board   HAND TENDON SURGERY Left 2021   JOINT REPLACEMENT Right 09/2020   hip replacement    SEPTOPLASTY  March 2013   STRABISMUS SURGERY  2006   TOTAL HIP ARTHROPLASTY Right 09/24/2020   Procedure: TOTAL HIP ARTHROPLASTY;  Surgeon: Donato Heinz, MD;  Location: [facility] ORS;  Service: Orthopedics;  Laterality: Right;   TOTAL THYMECTOMY  1995   XI ROBOTIC ASSISTED INGUINAL HERNIA REPAIR WITH MESH Right 11/06/2020   Procedure: XI ROBOTIC ASSISTED INGUINAL HERNIA REPAIR WITH MESH;  Surgeon: Henrene Dodge, MD;  Location: [facility] ORS;  Service: General;  Laterality: Right;    Allergies: Allergies as of 04/27/2023 - Review Complete 03/22/2023  Allergen Reaction Noted   Cefadroxil Rash 04/26/2012    Medications:  Current Outpatient Medications:    alendronate (FOSAMAX) 35 MG tablet, TAKE 1 TABLET BY MOUTH ONE TIME PER WEEK, Disp: 12 tablet, Rfl: 1   aspirin EC 81 MG tablet, Take 81 mg by mouth daily. Swallow whole., Disp: , Rfl:    azaTHIOprine (IMURAN) 50 MG tablet, Take 150 mg by mouth daily., Disp: , Rfl:    CALCIUM PO, Take 500 mg by mouth daily before breakfast. , Disp: , Rfl:    Flaxseed, Linseed, (FLAXSEED OIL) 1000 MG CAPS, Take 1,000 mg by mouth daily., Disp: , Rfl:    folic  acid (FOLVITE) 1 MG tablet, Take 1 mg by mouth daily., Disp: , Rfl:    pramipexole (MIRAPEX) 0.5 MG tablet, TAKE 2 TABLETS (1 MG TOTAL) BY MOUTH EVERY DAY AT BEDTIME, Disp: 180 tablet, Rfl: 1   predniSONE (DELTASONE) 10 MG tablet, Take 15 mg by mouth every other day., Disp: , Rfl:    rosuvastatin (CRESTOR) 5 MG tablet, TAKE 1 TABLET (5 MG TOTAL) BY MOUTH DAILY., Disp: 90 tablet, Rfl: 2  Social History: Social History   Tobacco Use   Smoking status: Never   Smokeless tobacco: Never  Vaping Use   Vaping status: Never Used  Substance Use Topics   Alcohol use: No   Drug use: No     Family Medical History: Family History  Problem Relation Age of Onset   Heart disease Mother    Heart disease Father    Arrhythmia Father    Cancer Maternal Uncle        prostate   Cancer Maternal Grandmother        colon    Physical Examination: Vitals:   04/27/23 1110  BP: 96/62    General: Patient is in no apparent distress. Attention to examination is appropriate.  Neck:   Supple.  Full range of motion.  He does appear to hold his right shoulder higher than his left shoulder.  Respiratory: Patient is breathing without any difficulty.   NEUROLOGICAL:     Awake, alert, oriented to person, place, and time.  Speech is clear and fluent.   Cranial Nerves: Pupils equal round and reactive to light.  Facial tone is symmetric.  Facial sensation is symmetric. Shoulder shrug is symmetric. Tongue protrusion is midline.    Strength: Side Biceps Triceps Deltoid Interossei Grip Wrist Ext. Wrist Flex.  R 5 5 5 5 5 5 5   L 5 5 5 5 5 5 5    Side Iliopsoas Quads Hamstring PF DF EHL  R 5 5 5 5 5 5   L 5 5 5 5 5 5    Reflexes are 2+ and symmetric at the biceps, triceps, brachioradialis, patella and achilles.   Hoffman's is absent. Clonus is absent  Bilateral upper and lower extremity sensation is intact to light touch      No evidence of dysmetria noted.  Gait is normal.    Imaging: Narrative & Impression  CLINICAL DATA:  Tingling in the feet.  Spine pain.   EXAM: LUMBAR SPINE - 2-3 VIEW   COMPARISON:  CT abdomen and pelvis 10/20/2021   FINDINGS: No evidence of acute fracture or traumatic malalignment. Spondylosis is at most mild. Mild lumbar facet arthropathy at L4-L5 and L5-S1.   IMPRESSION: Mild spondylosis and facet arthropathy.     Electronically Signed   By: Minerva Fester M.D.   On: 03/27/2023 00:33   I have personally reviewed the images and agree with the above interpretation.  [facility] - 03/07/2023 5:08 PM EDT  This result has an  attachment that is not available.  **  This is a textual report. For a PDF version of this report, please refer to this patient's "Procedures" tab in EPIC  **  Final Report [facility] DEPARTMENT OF NEUROLOGY NEUROMUSCULAR DIVISION ELECTROMYOGRAPHY LABORATORY   Name: Frank Cabrera Age: 57 years Date of Study: 03/07/2023  2:36:23 PM MRN: [id number] Height: 5\' 5"  Ref. Provider: Evangeline Gula Sex at Birth: Male Weight: 156 lbs. Ref. Source:   Clinical Impression: 57 year old male with history of myasthenia gravis  presents for evaluation of bilateral sensation of feeling cold in his toes.  This started about 4 months ago symmetrically in both his feet not been progressing and there is no associated radiating low back pain.    On focused neurological examination full strength in bilateral lower extremities except mild EHL weakness on the right absent ankle reflexes and intact sensation to pinprick.   Specific Questions: Assess for peripheral neuropathy.  ** THIS IS A TEXTUAL REPORT. REFER TO PDF FOR TABLE DATA **  Summary: NCS & EMG: NCS Studies were performed after warming limbs to 34C with limb temperature maintained at 32-34C throughout testing. 1. Right sural, superficial sensory and ulnar digit sensory responses were normal. 2. Right fibular-EDB motor response was absent. 3. Right tibial motor response showed reduced amplitude at the knee with normal F-wave latency. 4. Left ulnar motor response was normal including F-wave latency.  EMG Concentric needle examination of selected muscles in the right upper and lower extremity was performed.  There was no abnormal spontaneous activity in the muscles tested.  Right tibialis anterior and vastus lateralis showed few large amplitude polyphasic MUAP upon activation with normal recruitment.  All the remainder of the muscles were normal.    Conclusion: This is an abnormal study. There is  electrodiagnostic evidence of a chronic right L4-5 radiculopathy without any evidence of active ongoing denervation. There is no electrodiagnostic evidence of a large fiber peripheral neuropathy.  Study Participants: Technician: Carolyne Fiscal, CNCT Resident/Fellow: Jeannie Done, M.B.B.S. Attending Physician: [place] Mayberry, M.D.  Electronically signed by [place] Mayberry, M.D. on 03/07/2023 at 5:08:09 PM   Medical Decision Making/Assessment and Plan: Mr. Barsky is a pleasant 57 y.o. male with a previous exacerbation of neck and upper back pain that radiated into the right side.  Over the past few months this has improved significantly thankfully.  He does have some residual symptoms including intermittent neck pain and some feelings of tightness.  Does not have any new neuropathic symptoms.  Overall improving significantly.  His lumbar x-rays showed some diffuse low-grade spondylosis did not appear to be causing any issues.  He does have a history of a chronic lumbar radiculopathy diagnosed on EMG.  Given that most of his symptoms were in his neck radiating into his shoulder we would like to get some x-rays as he has had previous spondylosis with road reversal of his normal cervical lordosis.  At this point we can continue to follow-up as needed.  We do feel that he may benefit from a physical therapy evaluation given his postural imbalance and history of cervical spondylosis.  We have made a referral.  Will follow-up on his neck x-rays.  Thank you for involving me in the care of this patient.    Lovenia Kim MD/MSCR Neurosurgery

## 2023-04-27 ENCOUNTER — Ambulatory Visit
Admission: RE | Admit: 2023-04-27 | Discharge: 2023-04-27 | Disposition: A | Discharge status: Home / Self Care | Payer: 59 | Attending: Neurosurgery | Admitting: Neurosurgery

## 2023-04-27 ENCOUNTER — Encounter: Payer: Self-pay | Admitting: Neurosurgery

## 2023-04-27 ENCOUNTER — Ambulatory Visit (INDEPENDENT_AMBULATORY_CARE_PROVIDER_SITE_OTHER): Payer: 59 | Admitting: Neurosurgery

## 2023-04-27 ENCOUNTER — Ambulatory Visit
Admission: RE | Admit: 2023-04-27 | Discharge: 2023-04-27 | Disposition: A | Discharge status: Home / Self Care | Payer: 59 | Source: Ambulatory Visit | Attending: Neurosurgery | Admitting: Neurosurgery

## 2023-04-27 VITALS — BP 96/62 | Wt 157.6 lb

## 2023-04-27 DIAGNOSIS — M542 Cervicalgia: Secondary | ICD-10-CM

## 2023-04-27 NOTE — Patient Instructions (Signed)
[facility] [address] [phone]

## 2023-05-09 ENCOUNTER — Encounter: Payer: Self-pay | Admitting: Cardiovascular Disease

## 2023-05-09 ENCOUNTER — Encounter: Payer: Self-pay | Admitting: Internal Medicine

## 2023-05-09 DIAGNOSIS — Z1211 Encounter for screening for malignant neoplasm of colon: Secondary | ICD-10-CM

## 2023-05-19 ENCOUNTER — Telehealth: Payer: Self-pay

## 2023-05-19 NOTE — Telephone Encounter (Signed)
Patient is calling returning call to schedule his colonoscopy

## 2023-05-20 NOTE — Telephone Encounter (Signed)
Message left for patient to return my call.  

## 2023-05-21 ENCOUNTER — Other Ambulatory Visit: Payer: Self-pay | Admitting: Internal Medicine

## 2023-05-23 ENCOUNTER — Telehealth: Payer: Self-pay | Admitting: *Deleted

## 2023-05-23 ENCOUNTER — Other Ambulatory Visit: Payer: Self-pay | Admitting: *Deleted

## 2023-05-23 DIAGNOSIS — Z1211 Encounter for screening for malignant neoplasm of colon: Secondary | ICD-10-CM

## 2023-05-23 MED ORDER — NA SULFATE-K SULFATE-MG SULF 17.5-3.13-1.6 GM/177ML PO SOLN: 1.0000 | Freq: Once | ORAL | 0 refills | Status: AC

## 2023-05-23 NOTE — Telephone Encounter (Signed)
Gastroenterology Pre-Procedure Review  Request Date: 07/18/2023 Requesting Physician: Dr. Tobi Bastos  PATIENT REVIEW QUESTIONS: The patient responded to the following health history questions as indicated:    1. Are you having any GI issues? no 2. Do you have a personal history of Polyps? no 3. Do you have a family history of Colon Cancer or Polyps? no 4. Diabetes Mellitus? no 5. Joint replacements in the past 12 months?no 6. Major health problems in the past 3 months?no 7. Any artificial heart valves, MVP, or defibrillator?no    MEDICATIONS & ALLERGIES:    Patient reports the following regarding taking any anticoagulation/antiplatelet therapy:   Plavix, Coumadin, Eliquis, Xarelto, Lovenox, Pradaxa, Brilinta, or Effient? no Aspirin? yes (81 mg)  Patient confirms/reports the following medications:  Current Outpatient Medications  Medication Sig Dispense Refill   alendronate (FOSAMAX) 35 MG tablet TAKE 1 TABLET BY MOUTH ONE TIME PER WEEK 12 tablet 1   aspirin EC 81 MG tablet Take 81 mg by mouth daily. Swallow whole.     azaTHIOprine (IMURAN) 50 MG tablet Take 150 mg by mouth daily.     CALCIUM PO Take 500 mg by mouth daily before breakfast.      Flaxseed, Linseed, (FLAXSEED OIL) 1000 MG CAPS Take 1,000 mg by mouth daily.     folic acid (FOLVITE) 1 MG tablet Take 1 mg by mouth daily.     pramipexole (MIRAPEX) 0.5 MG tablet TAKE 2 TABLETS (1 MG TOTAL) BY MOUTH EVERY DAY AT BEDTIME 180 tablet 1   predniSONE (DELTASONE) 10 MG tablet Take 15 mg by mouth every other day.     rosuvastatin (CRESTOR) 5 MG tablet TAKE 1 TABLET (5 MG TOTAL) BY MOUTH DAILY. 90 tablet 2   No current facility-administered medications for this visit.    Patient confirms/reports the following allergies:  Allergies  Allergen Reactions   Cefadroxil Rash    No orders of the defined types were placed in this encounter.   AUTHORIZATION INFORMATION Primary Insurance: 1D#: Group #:  Secondary  Insurance: 1D#: Group #:  SCHEDULE INFORMATION: Date: 07/18/2023 Time: Location:  [facility]

## 2023-05-23 NOTE — Telephone Encounter (Signed)
Colonoscopy schedule on 07/18/2023

## 2023-05-24 ENCOUNTER — Telehealth: Payer: Self-pay

## 2023-05-24 NOTE — Telephone Encounter (Signed)
Call pt to make him aware that procedure was denied.Left message for pt to call [employer] at [phone]

## 2023-05-24 NOTE — Telephone Encounter (Signed)
Pt procedure wasn't approved it stated not covered per [facility]. He listed with Dr. Tobi Bastos

## 2023-05-26 NOTE — Telephone Encounter (Signed)
Called patient to let him know that we will have to cancel his procedure since his insurance denied his procedure. I ask patient to call his insurance to ask why it was denied. I told him that some insurances require a certain facility, so to ask if that was the reason it was denies. Patient stated that he would call his insurance and ask why. I told him to call us back if he is able to fix the situation.  I then called the endoscopy unit to go ahead and cancel his procedure.

## 2023-06-10 ENCOUNTER — Encounter: Payer: Self-pay | Admitting: Internal Medicine

## 2023-06-14 ENCOUNTER — Encounter: Payer: Self-pay | Admitting: Internal Medicine

## 2023-06-16 ENCOUNTER — Telehealth: Payer: Self-pay

## 2023-06-16 NOTE — Telephone Encounter (Signed)
Per [facility] must do a peer to peer with [employer] Here is our reference number [id number] for the call 06-16-23    [employer] at [phone] Press 1 for Peer to Peer  Press 0 # until a live person comes in (instead of inserting a case number)   If needed this authorization number with [facility] is [id number] OR with [employer] is [id number] [phone]  Shouldnt need this, but, my [employer] Member ID [id number] Group Number [id number]

## 2023-06-20 ENCOUNTER — Telehealth: Payer: Self-pay | Admitting: *Deleted

## 2023-06-20 ENCOUNTER — Other Ambulatory Visit: Payer: Self-pay | Admitting: *Deleted

## 2023-06-20 DIAGNOSIS — Z1211 Encounter for screening for malignant neoplasm of colon: Secondary | ICD-10-CM

## 2023-06-20 MED ORDER — NA SULFATE-K SULFATE-MG SULF 17.5-3.13-1.6 GM/177ML PO SOLN: 1.0000 | Freq: Once | ORAL | 0 refills | Status: AC

## 2023-06-20 NOTE — Telephone Encounter (Addendum)
Patient have been reschedule to Monday, 08/08/2023 with Dr Audelia Acton instructions will be sent to patient with update dates. Rx for prep solution will be sent as well again since it was cancel.  Patient verbalized understanding.

## 2023-06-20 NOTE — Telephone Encounter (Addendum)
Per pt would like to reschedule procedure. Pt states he would like to have OCT. 14 date back.

## 2023-06-20 NOTE — Telephone Encounter (Signed)
Per [facility] pt is approved Case number [id number] Sept. 16-2024 until Dec. 15,2024

## 2023-06-20 NOTE — Telephone Encounter (Signed)
Please see telephone encounter on 06/16/2023 for more detail.  Reschedule due to insurance.  Since 07/18/2023 is full, patient would like to be schedule on 08/08/2023.  New instructions will be sent to patient with update dates. Rx for prep solution will be sent as well again since it was cancel.   Patient verbalized understanding.      Lane Hacker       06/20/23  3:55 PM  Note Per [facility] pt is approved Case number [id number] Sept. 16-2024 until Dec. 15,2024

## 2023-06-20 NOTE — Telephone Encounter (Signed)
Called them they said --->Missed reconsideration time---->appeal through health plan with [employer]  ---> need to do that appeal :   Please : fax: [phone] : send all information and reason for denial  to be reviewed .    Dr Wyline Mood MD,MRCP [facility]) Gastroenterology/Hepatology Pager: [phone]

## 2023-06-21 ENCOUNTER — Ambulatory Visit: Payer: 59 | Attending: Cardiovascular Disease

## 2023-06-21 ENCOUNTER — Other Ambulatory Visit: Payer: Self-pay | Admitting: Cardiovascular Disease

## 2023-06-21 DIAGNOSIS — M79673 Pain in unspecified foot: Secondary | ICD-10-CM

## 2023-06-21 DIAGNOSIS — M79672 Pain in left foot: Secondary | ICD-10-CM | POA: Diagnosis not present

## 2023-06-21 DIAGNOSIS — E785 Hyperlipidemia, unspecified: Secondary | ICD-10-CM

## 2023-06-21 DIAGNOSIS — G7000 Myasthenia gravis without (acute) exacerbation: Secondary | ICD-10-CM

## 2023-06-21 DIAGNOSIS — M79671 Pain in right foot: Secondary | ICD-10-CM | POA: Diagnosis not present

## 2023-06-21 DIAGNOSIS — R931 Abnormal findings on diagnostic imaging of heart and coronary circulation: Secondary | ICD-10-CM

## 2023-06-22 LAB — VAS US ABI WITH/WO TBI
Left ABI: 1.26
Right ABI: 1.21

## 2023-06-23 NOTE — Telephone Encounter (Signed)
Message left for patient to return my call.

## 2023-06-23 NOTE — Telephone Encounter (Signed)
Patient called in to reschedule his colonoscopy.

## 2023-06-24 NOTE — Telephone Encounter (Signed)
Patient left a voicemail wanting to change to his colonoscopy to a Friday instead. Message left for patient to return my call.

## 2023-06-24 NOTE — Telephone Encounter (Signed)
Patient called me back and we are reschedule patient on 08/12/2023.  Called endo unit to make the change. New instructions will be sent.  Patient verbalized understanding.

## 2023-07-18 ENCOUNTER — Ambulatory Visit: Admit: 2023-07-18 | Payer: 59 | Admitting: Gastroenterology

## 2023-07-18 SURGERY — COLONOSCOPY WITH PROPOFOL
Anesthesia: General

## 2023-08-10 ENCOUNTER — Telehealth: Payer: Self-pay

## 2023-08-10 NOTE — Telephone Encounter (Signed)
Patient called and left a voicemail wanting to reschedule colonoscopy. He has a family emergency.  I called patient back to let him know we received his message, and it was sent to the scheduler.

## 2023-08-10 NOTE — Telephone Encounter (Signed)
Spoken to patient and requesting to reschedule to 09/16/2023.  Called endo unit to make that change.  New instructions will be sent.

## 2023-09-07 ENCOUNTER — Telehealth: Payer: Self-pay

## 2023-09-07 ENCOUNTER — Telehealth: Payer: Self-pay | Admitting: *Deleted

## 2023-09-07 NOTE — Telephone Encounter (Signed)
Patient called in to reschedule his colonoscopy.

## 2023-09-07 NOTE — Telephone Encounter (Signed)
Colonoscopy reschedule to 10/21/2023

## 2023-09-07 NOTE — Telephone Encounter (Signed)
Patient called office, requesting to reschedule his colonoscopy.  Requesting to change to 10/21/2023.  Called endo unit to make the change.  Will send new instructions. Patient have already pick up prep solution.

## 2023-10-08 ENCOUNTER — Other Ambulatory Visit: Payer: Self-pay | Admitting: Internal Medicine

## 2023-10-11 ENCOUNTER — Telehealth: Payer: Self-pay

## 2023-10-11 MED ORDER — PRAMIPEXOLE DIHYDROCHLORIDE 0.5 MG PO TABS: ORAL_TABLET | ORAL | 0 refills | Status: DC

## 2023-10-11 NOTE — Telephone Encounter (Signed)
 Copied from CRM [id number]. Topic: Clinical - Medication Refill >> Oct 11, 2023  8:14 AM Frank Cabrera wrote: Most Recent Primary Care Visit:  Provider: VINCENTE SABER  Department: [facility]  Visit Type: OFFICE VISIT  Date: 03/22/2023  Medication: pramipexole  (MIRAPEX ) 0.5 MG tablet   Has the patient contacted their pharmacy? Yes, pharmacy sent a request to the provider.  (Agent: If no, request that the patient contact the pharmacy for the refill. If patient does not wish to contact the pharmacy document the reason why and proceed with request.) (Agent: If yes, when and what did the pharmacy advise?)  Is this the correct pharmacy for this prescription? Yes If no, delete pharmacy and type the correct one.  This is the patient's preferred pharmacy:  [facility] [address], [place] - [address] [place] [address] Phone: [phone] Fax: [phone]   Has the prescription been filled recently? No  Is the patient out of the medication? Yes  Has the patient been seen for an appointment in the last year OR does the patient have an upcoming appointment? Yes  Can we respond through MyChart? No, phone call  Agent: Please be advised that Rx refills may take up to 3 business days. We ask that you follow-up with your pharmacy.

## 2023-10-11 NOTE — Addendum Note (Signed)
 Addended by: Sandy Salaam on: 10/11/2023 09:42 AM   Modules accepted: Orders

## 2023-10-11 NOTE — Telephone Encounter (Signed)
 Spoke with pt and scheduled him for a follow up with Dr. Darrick Huntsman. Medication has been refilled for 30 days only.

## 2023-10-13 ENCOUNTER — Encounter: Payer: Self-pay | Admitting: Gastroenterology

## 2023-10-20 ENCOUNTER — Encounter: Payer: Self-pay | Admitting: Gastroenterology

## 2023-10-21 ENCOUNTER — Encounter: Payer: Self-pay | Admitting: Gastroenterology

## 2023-10-21 ENCOUNTER — Encounter
Admission: RE | Disposition: A | Discharge status: Home / Self Care | Payer: Self-pay | Source: Home / Self Care | Attending: Gastroenterology

## 2023-10-21 ENCOUNTER — Encounter: Payer: Self-pay | Admitting: Anesthesiology

## 2023-10-21 ENCOUNTER — Ambulatory Visit
Admission: RE | Admit: 2023-10-21 | Discharge: 2023-10-21 | Disposition: A | Discharge status: Home / Self Care | Payer: 59 | Attending: Gastroenterology | Admitting: Gastroenterology

## 2023-10-21 ENCOUNTER — Other Ambulatory Visit: Payer: Self-pay

## 2023-10-21 DIAGNOSIS — Z1211 Encounter for screening for malignant neoplasm of colon: Secondary | ICD-10-CM | POA: Diagnosis present

## 2023-10-21 DIAGNOSIS — Z539 Procedure and treatment not carried out, unspecified reason: Secondary | ICD-10-CM | POA: Insufficient documentation

## 2023-10-21 HISTORY — PX: COLONOSCOPY WITH PROPOFOL: SHX5780

## 2023-10-21 SURGERY — COLONOSCOPY WITH PROPOFOL
Anesthesia: General

## 2023-10-21 MED ORDER — SODIUM CHLORIDE 0.9 % IV SOLN: INTRAVENOUS | Status: DC

## 2023-10-21 NOTE — Anesthesia Preprocedure Evaluation (Signed)
Anesthesia Evaluation  Patient identified by MRN, date of birth, ID band Patient awake    Reviewed: Allergy & Precautions, NPO status , Patient's Chart, lab work & pertinent test results  Airway        Dental   Pulmonary           Cardiovascular      Neuro/Psych HX of Myastenia Gravis    GI/Hepatic   Endo/Other    Renal/GU      Musculoskeletal   Abdominal   Peds  Hematology   Anesthesia Other Findings   Reproductive/Obstetrics                              Anesthesia Physical Anesthesia Plan Anesthesia Quick Evaluation

## 2023-10-24 ENCOUNTER — Encounter: Payer: Self-pay | Admitting: Gastroenterology

## 2023-10-24 ENCOUNTER — Other Ambulatory Visit: Payer: Self-pay | Admitting: *Deleted

## 2023-10-24 ENCOUNTER — Telehealth: Payer: Self-pay | Admitting: *Deleted

## 2023-10-24 DIAGNOSIS — Z1211 Encounter for screening for malignant neoplasm of colon: Secondary | ICD-10-CM

## 2023-10-24 MED ORDER — NA SULFATE-K SULFATE-MG SULF 17.5-3.13-1.6 GM/177ML PO SOLN: 1.0000 | Freq: Once | ORAL | 0 refills | Status: AC

## 2023-10-24 NOTE — Telephone Encounter (Signed)
Patient called office because he need to reschedule his colonoscopy. Patient was not cleaned out.  Requesting to reschedule on 12/05/2023 with 2 day prep.  New instructions will be sent.

## 2023-11-04 ENCOUNTER — Encounter: Payer: Self-pay | Admitting: Internal Medicine

## 2023-11-04 ENCOUNTER — Ambulatory Visit: Payer: 59 | Admitting: Internal Medicine

## 2023-11-04 VITALS — BP 108/72 | HR 58 | Ht 66.0 in | Wt 156.8 lb

## 2023-11-04 DIAGNOSIS — Z0000 Encounter for general adult medical examination without abnormal findings: Secondary | ICD-10-CM

## 2023-11-04 DIAGNOSIS — Z79899 Other long term (current) drug therapy: Secondary | ICD-10-CM | POA: Diagnosis not present

## 2023-11-04 DIAGNOSIS — G7000 Myasthenia gravis without (acute) exacerbation: Secondary | ICD-10-CM

## 2023-11-04 DIAGNOSIS — Z23 Encounter for immunization: Secondary | ICD-10-CM | POA: Diagnosis not present

## 2023-11-04 DIAGNOSIS — M8580 Other specified disorders of bone density and structure, unspecified site: Secondary | ICD-10-CM

## 2023-11-04 DIAGNOSIS — R1312 Dysphagia, oropharyngeal phase: Secondary | ICD-10-CM

## 2023-11-04 DIAGNOSIS — Z125 Encounter for screening for malignant neoplasm of prostate: Secondary | ICD-10-CM | POA: Insufficient documentation

## 2023-11-04 DIAGNOSIS — E7800 Pure hypercholesterolemia, unspecified: Secondary | ICD-10-CM | POA: Diagnosis not present

## 2023-11-04 DIAGNOSIS — D4989 Neoplasm of unspecified behavior of other specified sites: Secondary | ICD-10-CM

## 2023-11-04 DIAGNOSIS — T50905A Adverse effect of unspecified drugs, medicaments and biological substances, initial encounter: Secondary | ICD-10-CM

## 2023-11-04 MED ORDER — PRAMIPEXOLE DIHYDROCHLORIDE 0.5 MG PO TABS: ORAL_TABLET | ORAL | 0 refills | Status: DC

## 2023-11-04 MED ORDER — ALENDRONATE SODIUM 35 MG PO TABS: ORAL_TABLET | ORAL | 1 refills | Status: AC

## 2023-11-04 NOTE — Patient Instructions (Addendum)
I recommend that you get the pneumonia vaccine (Prevnar 20) when you return for labs  I HAVE ORDERED A SWALLOW EVALUATION.  [facility] WILL CALL YOU TO SET THIS UP

## 2023-11-04 NOTE — Assessment & Plan Note (Signed)
PSA is [facility]  last one Feb 2024 1.04

## 2023-11-04 NOTE — Progress Notes (Unsigned)
Patient ID: Frank Cabrera, male    DOB: Feb 25, 1966  Age: 58 y.o. MRN: [id number]  The patient is here for annual preventive examination and management of other chronic and acute problems.   The risk factors are reflected in the social history.   The roster of all physicians providing medical care to patient - is listed in the Snapshot section of the chart.   Activities of daily living:  The patient is 100% independent in all ADLs: dressing, toileting, feeding as well as independent mobility   Home safety : The patient has smoke detectors in the home. They wear seatbelts.  There are no unsecured firearms at home. There is no violence in the home.    There is no risks for hepatitis, STDs or HIV. There is no   history of blood transfusion. They have no travel history to infectious disease endemic areas of the world.   The patient has seen their dentist in the last six month. They have seen their eye doctor in the last year. The patinet  denies slight hearing difficulty with regard to whispered voices and some television programs.  They have deferred audiologic testing in the last year.  They do not  have excessive sun exposure. Discussed the need for sun protection: hats, long sleeves and use of sunscreen if there is significant sun exposure.    Diet: the importance of a healthy diet is discussed. They do have a healthy diet.   The benefits of regular aerobic exercise were discussed. The patient  exercises  3 to 5 days per week  for  60 minutes.    Depression screen: there are no signs or vegative symptoms of depression- irritability, change in appetite, anhedonia, sadness/tearfullness.   The following portions of the patient's history were reviewed and updated as appropriate: allergies, current medications, past family history, past medical history,  past surgical history, past social history  and problem list.   Visual acuity was not assessed per patient preference since the patient has regular  follow up with an  ophthalmologist. Hearing and body mass index were assessed and reviewed.    During the course of the visit the patient was educated and counseled about appropriate screening and preventive services including : fall prevention , diabetes screening, nutrition counseling, colorectal cancer screening, and recommended immunizations.    Chief Complaint:   1) myasthenia gravis : on slow prednisone wean per neurology 17.5 mg every other     3) colonoscopy rescheduled for incomplete prep  No results found for: "PSA1", "PSA"     Review of Symptoms  Patient denies headache, fevers, malaise, unintentional weight loss, skin rash, eye pain, sinus congestion and sinus pain, sore throat, dysphagia,  hemoptysis , cough, dyspnea, wheezing, chest pain, palpitations, orthopnea, edema, abdominal pain, nausea, melena, diarrhea, constipation, flank pain, dysuria, hematuria, urinary  Frequency, nocturia, numbness, tingling, seizures,  Focal weakness, Loss of consciousness,  Tremor, insomnia, depression, anxiety, and suicidal ideation.    Physical Exam:  BP 108/72   Pulse (!) 58   Ht 5\' 6"  (1.676 m)   Wt 156 lb 12.8 oz (71.1 kg)   SpO2 97%   BMI 25.31 kg/m    Physical Exam  Assessment and Plan: Long-term use of high-risk medication  Pure hypercholesterolemia    No follow-ups on file.  Sherlene Shams, MD

## 2023-11-06 DIAGNOSIS — Z0000 Encounter for general adult medical examination without abnormal findings: Secondary | ICD-10-CM | POA: Insufficient documentation

## 2023-11-06 DIAGNOSIS — Z1211 Encounter for screening for malignant neoplasm of colon: Secondary | ICD-10-CM | POA: Insufficient documentation

## 2023-11-06 DIAGNOSIS — M8580 Other specified disorders of bone density and structure, unspecified site: Secondary | ICD-10-CM | POA: Insufficient documentation

## 2023-11-06 NOTE — Assessment & Plan Note (Addendum)
Managed with prednisone and Imuran by [facility]

## 2023-11-06 NOTE — Assessment & Plan Note (Addendum)
Secondary to steroid use.  T scores  have improved from 2017 to 2023 .  Continue alendronate.

## 2023-11-06 NOTE — Assessment & Plan Note (Signed)

## 2023-11-14 ENCOUNTER — Other Ambulatory Visit: Payer: Self-pay | Admitting: Cardiovascular Disease

## 2023-11-14 DIAGNOSIS — E785 Hyperlipidemia, unspecified: Secondary | ICD-10-CM

## 2023-11-14 NOTE — Telephone Encounter (Signed)
 Hi,  Could you please schedule this patient a 12 month follow up visit? The patient was last seen by Dr. Alvenia Aus on 11-09-2022. Thank you so much.

## 2023-11-14 NOTE — Telephone Encounter (Signed)
 LVM to schedule appt

## 2023-11-16 NOTE — Telephone Encounter (Signed)
LVM to schedule appt

## 2023-11-21 ENCOUNTER — Ambulatory Visit: Payer: 59

## 2023-11-21 ENCOUNTER — Other Ambulatory Visit (INDEPENDENT_AMBULATORY_CARE_PROVIDER_SITE_OTHER): Payer: 59

## 2023-11-21 ENCOUNTER — Telehealth: Payer: Self-pay | Admitting: Internal Medicine

## 2023-11-21 DIAGNOSIS — Z79899 Other long term (current) drug therapy: Secondary | ICD-10-CM

## 2023-11-21 DIAGNOSIS — E7800 Pure hypercholesterolemia, unspecified: Secondary | ICD-10-CM

## 2023-11-21 NOTE — Telephone Encounter (Signed)
 noted

## 2023-11-21 NOTE — Telephone Encounter (Signed)
Spoke to pt he still has cough congestion and sore throat I explained to him that he needs to reschedule the Pneumonia vaccine. He will schedule when he comes in to get labs done today

## 2023-11-21 NOTE — Telephone Encounter (Signed)
Copied from CRM [id number]. Topic: Clinical - Medical Advice >> Nov 21, 2023  8:27 AM Frank Cabrera wrote: Reason for CRM: Patient stated he currently has a cold and wanted to know if it would be okay to come in for his pneumonia vaccine today at 11:15am.

## 2023-11-22 ENCOUNTER — Encounter: Payer: Self-pay | Admitting: Internal Medicine

## 2023-11-22 LAB — COMPREHENSIVE METABOLIC PANEL
AG Ratio: 1.7 (calc) (ref 1.0–2.5)
ALT: 13 U/L (ref 9–46)
AST: 17 U/L (ref 10–35)
Albumin: 3.9 g/dL (ref 3.6–5.1)
Alkaline phosphatase (APISO): 51 U/L (ref 35–144)
BUN: 16 mg/dL (ref 7–25)
CO2: 26 mmol/L (ref 20–32)
Calcium: 8.7 mg/dL (ref 8.6–10.3)
Chloride: 103 mmol/L (ref 98–110)
Creat: 0.8 mg/dL (ref 0.70–1.30)
Globulin: 2.3 g/dL (ref 1.9–3.7)
Glucose, Bld: 84 mg/dL (ref 65–99)
Potassium: 4.6 mmol/L (ref 3.5–5.3)
Sodium: 138 mmol/L (ref 135–146)
Total Bilirubin: 0.3 mg/dL (ref 0.2–1.2)
Total Protein: 6.2 g/dL (ref 6.1–8.1)

## 2023-11-22 LAB — LIPID PANEL
Cholesterol: 152 mg/dL (ref <200)
HDL: 70 mg/dL (ref >=40)
LDL Cholesterol (Calc): 67 mg/dL
Non-HDL Cholesterol (Calc): 82 mg/dL (ref <130)
Total CHOL/HDL Ratio: 2.2 (calc) (ref <5.0)
Triglycerides: 72 mg/dL (ref <150)

## 2023-11-22 LAB — CBC WITH DIFFERENTIAL/PLATELET
Absolute Lymphocytes: 1540 {cells}/uL (ref 850–3900)
Absolute Monocytes: 722 {cells}/uL (ref 200–950)
Basophils Absolute: 62 {cells}/uL (ref 0–200)
Basophils Relative: 0.7 %
Eosinophils Absolute: 141 {cells}/uL (ref 15–500)
Eosinophils Relative: 1.6 %
HCT: 40 % (ref 38.5–50.0)
Hemoglobin: 13.6 g/dL (ref 13.2–17.1)
MCH: 31.4 pg (ref 27.0–33.0)
MCHC: 34 g/dL (ref 32.0–36.0)
MCV: 92.4 fL (ref 80.0–100.0)
MPV: 10.2 fL (ref 7.5–12.5)
Monocytes Relative: 8.2 %
Neutro Abs: 6336 {cells}/uL (ref 1500–7800)
Neutrophils Relative %: 72 %
Platelets: 328 10*3/uL (ref 140–400)
RBC: 4.33 10*6/uL (ref 4.20–5.80)
RDW: 12.9 % (ref 11.0–15.0)
Total Lymphocyte: 17.5 %
WBC: 8.8 10*3/uL (ref 3.8–10.8)

## 2023-11-22 LAB — HEMOGLOBIN A1C
Hgb A1c MFr Bld: 5.5 %{Hb} (ref <5.7)
Mean Plasma Glucose: 111 mg/dL
eAG (mmol/L): 6.2 mmol/L

## 2023-11-22 LAB — TSH: TSH: 1.17 m[IU]/L (ref 0.40–4.50)

## 2023-11-22 LAB — LDL CHOLESTEROL, DIRECT: Direct LDL: 69 mg/dL (ref <100)

## 2023-12-01 ENCOUNTER — Other Ambulatory Visit: Payer: Self-pay | Admitting: Internal Medicine

## 2023-12-02 ENCOUNTER — Encounter: Payer: Self-pay | Admitting: Gastroenterology

## 2023-12-05 ENCOUNTER — Ambulatory Visit: Admitting: Anesthesiology

## 2023-12-05 ENCOUNTER — Encounter: Payer: Self-pay | Admitting: Gastroenterology

## 2023-12-05 ENCOUNTER — Ambulatory Visit
Admission: RE | Admit: 2023-12-05 | Discharge: 2023-12-05 | Disposition: A | Discharge status: Home / Self Care | Payer: 59 | Source: Ambulatory Visit | Attending: Gastroenterology | Admitting: Gastroenterology

## 2023-12-05 ENCOUNTER — Encounter
Admission: RE | Disposition: A | Discharge status: Home / Self Care | Payer: Self-pay | Source: Ambulatory Visit | Attending: Gastroenterology

## 2023-12-05 DIAGNOSIS — Z1211 Encounter for screening for malignant neoplasm of colon: Secondary | ICD-10-CM | POA: Insufficient documentation

## 2023-12-05 DIAGNOSIS — I2510 Atherosclerotic heart disease of native coronary artery without angina pectoris: Secondary | ICD-10-CM | POA: Insufficient documentation

## 2023-12-05 DIAGNOSIS — Z7952 Long term (current) use of systemic steroids: Secondary | ICD-10-CM | POA: Insufficient documentation

## 2023-12-05 DIAGNOSIS — G7000 Myasthenia gravis without (acute) exacerbation: Secondary | ICD-10-CM | POA: Insufficient documentation

## 2023-12-05 HISTORY — PX: COLONOSCOPY WITH PROPOFOL: SHX5780

## 2023-12-05 SURGERY — COLONOSCOPY WITH PROPOFOL
Anesthesia: General

## 2023-12-05 MED ORDER — PROPOFOL 500 MG/50ML IV EMUL
INTRAVENOUS | Status: DC | PRN
  Administered 2023-12-05: 150 ug/kg/min via INTRAVENOUS
  Administered 2023-12-05: 80 mg via INTRAVENOUS

## 2023-12-05 MED ORDER — ONDANSETRON HCL 4 MG/2ML IJ SOLN
INTRAMUSCULAR | Status: DC | PRN
  Administered 2023-12-05: 4 mg via INTRAVENOUS

## 2023-12-05 MED ORDER — SODIUM CHLORIDE 0.9 % IV SOLN: INTRAVENOUS | Status: DC

## 2023-12-05 NOTE — Anesthesia Postprocedure Evaluation (Signed)
 Anesthesia Post Note  Patient: Frank Cabrera  Procedure(s) Performed: COLONOSCOPY WITH PROPOFOL  Patient location during evaluation: Endoscopy Anesthesia Type: General Level of consciousness: awake and alert Pain management: pain level controlled Vital Signs Assessment: post-procedure vital signs reviewed and stable Respiratory status: spontaneous breathing, nonlabored ventilation, respiratory function stable and patient connected to nasal cannula oxygen Cardiovascular status: blood pressure returned to baseline and stable Postop Assessment: no apparent nausea or vomiting Anesthetic complications: no   There were no known notable events for this encounter.   Last Vitals:  Vitals:   12/05/23 0949 12/05/23 0954  BP: (!) 89/53 (!) 102/59  Pulse: (!) 49 (!) 49  Resp: 11 12  Temp:    SpO2: 100% 100%    Last Pain:  Vitals:   12/05/23 0954  TempSrc:   PainSc: 0-No pain                 Corinda Gubler

## 2023-12-05 NOTE — Transfer of Care (Signed)
 Immediate Anesthesia Transfer of Care Note  Patient: Frank Cabrera  Procedure(s) Performed: COLONOSCOPY WITH PROPOFOL  Patient Location: PACU  Anesthesia Type:General  Level of Consciousness: awake  Airway & Oxygen Therapy: Patient Spontanous Breathing  Post-op Assessment: Report given to RN and Post -op Vital signs reviewed and stable  Post vital signs: Reviewed and stable  Last Vitals:  Vitals Value Taken Time  BP 87/46 12/05/23 0921  Temp 36.1 C 12/05/23 0917  Pulse 56 12/05/23 0920  Resp 20 12/05/23 0921  SpO2 98 % 12/05/23 0920  Vitals shown include unfiled device data.  Last Pain:  Vitals:   12/05/23 0917  TempSrc: Temporal  PainSc: 0-No pain         Complications: There were no known notable events for this encounter.

## 2023-12-05 NOTE — H&P (Signed)
 Wyline Mood, MD [address], [address], [place], [place], [address] [address], [address], [place], [place], [address] Phone: [phone]  Fax: [phone]  Primary Care Physician:  Sherlene Shams, MD   Pre-Procedure History & Physical: HPI:  Frank Cabrera is a 58 y.o. male is here for an colonoscopy.   Past Medical History:  Diagnosis Date   CAD (coronary artery disease)    GERD (gastroesophageal reflux disease)    Herpes simplex type II infection 2003   History of 2019 novel coronavirus disease (COVID-19) 03/09/2019   HLD (hyperlipidemia)    Long term current use of immunosuppressive drug    Myasthenia gravis (HCC)    Myasthenia gravis associated with thymoma (HCC)    Osteoporosis    Pansinusitis    PONV (postoperative nausea and vomiting)    patient also stated that toes and fingers numbness after the third day of surgery   Restless leg syndrome     Past Surgical History:  Procedure Laterality Date   COLONOSCOPY WITH PROPOFOL N/A 10/21/2023   Procedure: COLONOSCOPY WITH PROPOFOL;  Surgeon: Wyline Mood, MD;  Location: [facility] ENDOSCOPY;  Service: Gastroenterology;  Laterality: N/A;   EYE MUSCLE SURGERY  2008   for strabismus, [place],  Board   HAND TENDON SURGERY Left 2021   JOINT REPLACEMENT Right 09/2020   hip replacement    SEPTOPLASTY  March 2013   STRABISMUS SURGERY  2006   TOTAL HIP ARTHROPLASTY Right 09/24/2020   Procedure: TOTAL HIP ARTHROPLASTY;  Surgeon: Donato Heinz, MD;  Location: [facility] ORS;  Service: Orthopedics;  Laterality: Right;   TOTAL THYMECTOMY  1995   XI ROBOTIC ASSISTED INGUINAL HERNIA REPAIR WITH MESH Right 11/06/2020   Procedure: XI ROBOTIC ASSISTED INGUINAL HERNIA REPAIR WITH MESH;  Surgeon: Henrene Dodge, MD;  Location: [facility] ORS;  Service: General;  Laterality: Right;    Prior to Admission medications   Medication Sig Start Date End Date Taking? Authorizing Provider  CALCIUM PO Take 500 mg by mouth daily before breakfast.    Yes  [provider]  Flaxseed, Linseed, (FLAXSEED OIL) 1000 MG CAPS Take 1,000 mg by mouth daily.   Yes [provider]  folic acid (FOLVITE) 1 MG tablet Take 1 mg by mouth daily. 04/17/13  Yes [provider]  pramipexole (MIRAPEX) 0.5 MG tablet TAKE 2 TABLETS (1 MG TOTAL) BY MOUTH EVERY DAY AT BEDTIME 11/04/23  Yes Sherlene Shams, MD  predniSONE (DELTASONE) 10 MG tablet Take 17.5 mg by mouth every other day.   Yes [provider]  rosuvastatin (CRESTOR) 5 MG tablet TAKE 1 TABLET (5 MG TOTAL) BY MOUTH DAILY. 11/16/23  Yes Iran Ouch, MD  alendronate (FOSAMAX) 35 MG tablet Take with a full glass of water on an empty stomach.TAKE 1 TABLET BY MOUTH ONE TIME PER WEEK 11/04/23   Sherlene Shams, MD  aspirin EC 81 MG tablet Take 81 mg by mouth daily. Swallow whole.    [provider]  azaTHIOprine (IMURAN) 50 MG tablet Take 150 mg by mouth daily.    [provider]    Allergies as of 10/24/2023 - Review Complete 10/21/2023  Allergen Reaction Noted   Cefadroxil Rash 04/26/2012    Family History  Problem Relation Age of Onset   Heart disease Mother    Heart disease Father    Arrhythmia Father    Cancer Maternal Uncle        prostate   Cancer Maternal Grandmother  colon    Social History   Socioeconomic History   Marital status: Married    Spouse name: Not on file   Number of children: Not on file   Years of education: Not on file   Highest education level: Not on file  Occupational History    Employer: [employer]  Tobacco Use   Smoking status: Never   Smokeless tobacco: Never  Vaping Use   Vaping status: Never Used  Substance and Sexual Activity   Alcohol use: No   Drug use: No   Sexual activity: Yes  Other Topics Concern   Not on file  Social History Narrative   Not on file   Social Drivers of Health   Financial Resource Strain: Not on file  Food Insecurity: Not on file  Transportation Needs:  Not on file  Physical Activity: Not on file  Stress: Not on file  Social Connections: Not on file  Intimate Partner Violence: Not on file    Review of Systems: See HPI, otherwise negative ROS  Physical Exam: BP 107/76   Pulse 78   Temp (!) 96.4 F (35.8 C) (Temporal)   Resp 17   Ht 5\' 6"  (1.676 m)   Wt 69 kg   SpO2 100%   BMI 24.57 kg/m  General:   Alert,  pleasant and cooperative in NAD Head:  Normocephalic and atraumatic. Neck:  Supple; no masses or thyromegaly. Lungs:  Clear throughout to auscultation, normal respiratory effort.    Heart:  +S1, +S2, Regular rate and rhythm, No edema. Abdomen:  Soft, nontender and nondistended. Normal bowel sounds, without guarding, and without rebound.   Neurologic:  Alert and  oriented x4;  grossly normal neurologically.  Impression/Plan: Frank Cabrera is here for an colonoscopy to be performed for Screening colonoscopy average risk   Risks, benefits, limitations, and alternatives regarding  colonoscopy have been reviewed with the patient.  Questions have been answered.  All parties agreeable.   Wyline Mood, MD  12/05/2023, 8:06 AM \

## 2023-12-05 NOTE — OR Nursing (Signed)
 Mr. Cuff used the restroom twice and had RN to visualize output each time. The last time was less cloudy than the previous. Dr. Tobi Bastos spoke with Frank Cabrera and [name] has decided to proceed with his colonoscopy.

## 2023-12-05 NOTE — Op Note (Signed)
 [facility] Gastroenterology Patient Name: Frank Cabrera Procedure Date: 12/05/2023 8:42 AM MRN: [id number] Account #: [id number] Date of Birth: 03-Mar-1966 Admit Type: Outpatient Age: 57 Room: [facility] ENDO ROOM 1 Gender: Male Note Status: Finalized Instrument Name: Nelda Marseille [id number] Procedure:             Colonoscopy Indications:           Screening for colorectal malignant neoplasm Providers:             Wyline Mood MD, MD Referring MD:          Duncan Dull, MD (Referring MD) Medicines:             Monitored Anesthesia Care Complications:         No immediate complications. Procedure:             Pre-Anesthesia Assessment:                        - Prior to the procedure, a History and Physical was                         performed, and patient medications, allergies and                         sensitivities were reviewed. The patient's tolerance                         of previous anesthesia was reviewed.                        - The risks and benefits of the procedure and the                         sedation options and risks were discussed with the                         patient. All questions were answered and informed                         consent was obtained.                        - ASA Grade Assessment: II - A patient with mild                         systemic disease.                        After obtaining informed consent, the colonoscope was                         passed under direct vision. Throughout the procedure,                         the patient's blood pressure, pulse, and oxygen                         saturations were monitored continuously. The                         Colonoscope was introduced  through the anus and                         advanced to the the cecum, identified by the                         appendiceal orifice. The colonoscopy was performed                         with ease. The patient tolerated the procedure well.                          The quality of the bowel preparation was excellent.                         The ileocecal valve, appendiceal orifice, and rectum                         were photographed. Findings:      The entire examined colon appeared normal on direct and retroflexion       views. Impression:            - The entire examined colon is normal on direct and                         retroflexion views.                        - No specimens collected. Recommendation:        - Discharge patient to home (with escort).                        - Resume previous diet.                        - Continue present medications.                        - Repeat colonoscopy in 10 years for screening                         purposes. Procedure Code(s):     --- Professional ---                        [phone], Colonoscopy, flexible; diagnostic, including                         collection of specimen(s) by brushing or washing, when                         performed (separate procedure) Diagnosis Code(s):     --- Professional ---                        Z12.11, Encounter for screening for malignant neoplasm                         of colon CPT copyright 2022 American Medical Association. All rights reserved. The codes documented in this report are preliminary and upon coder review may  be revised to meet current  compliance requirements. Wyline Mood, MD Wyline Mood MD, MD 12/05/2023 9:15:20 AM This report has been signed electronically. Number of Addenda: 0 Note Initiated On: 12/05/2023 8:42 AM Scope Withdrawal Time: 0 hours 10 minutes 0 seconds  Total Procedure Duration: 0 hours 14 minutes 50 seconds  Estimated Blood Loss:  Estimated blood loss: none.      [facility]

## 2023-12-05 NOTE — Anesthesia Preprocedure Evaluation (Signed)
 Anesthesia Evaluation  Patient identified by MRN, date of birth, ID band Patient awake    Reviewed: Allergy & Precautions, NPO status , Patient's Chart, lab work & pertinent test results  History of Anesthesia Complications (+) PONV and history of anesthetic complications  Airway Mallampati: II  TM Distance: >3 FB Neck ROM: Full    Dental no notable dental hx. (+) Teeth Intact   Pulmonary asthma , neg sleep apnea, neg COPD, Patient abstained from smoking.Not current smoker   Pulmonary exam normal breath sounds clear to auscultation       Cardiovascular Exercise Tolerance: Good METS(-) hypertension+ CAD  (-) Past MI (-) dysrhythmias  Rhythm:Regular Rate:Normal - Systolic murmurs    Neuro/Psych Myasthenia gravis, does not affect diaphragm (only limbs and e yelids). Takes prednisone 17mg  every other day, last taken today  Neuromuscular disease  negative psych ROS   GI/Hepatic ,GERD  ,,(+)     (-) substance abuse    Endo/Other  neg diabetes    Renal/GU negative Renal ROS     Musculoskeletal   Abdominal   Peds  Hematology   Anesthesia Other Findings Past Medical History: No date: CAD (coronary artery disease) No date: GERD (gastroesophageal reflux disease) 2003: Herpes simplex type II infection 03/09/2019: History of 2019 novel coronavirus disease (COVID-19) No date: HLD (hyperlipidemia) No date: Long term current use of immunosuppressive drug No date: Myasthenia gravis (HCC) No date: Myasthenia gravis associated with thymoma (HCC) No date: Osteoporosis No date: Pansinusitis No date: PONV (postoperative nausea and vomiting)     Comment:  patient also stated that toes and fingers numbness after              the third day of surgery No date: Restless leg syndrome  Reproductive/Obstetrics                             Anesthesia Physical Anesthesia Plan  ASA: 2  Anesthesia Plan:  General   Post-op Pain Management: Minimal or no pain anticipated   Induction: Intravenous  PONV Risk Score and Plan: 3 and Propofol infusion, TIVA and Ondansetron  Airway Management Planned: Nasal Cannula  Additional Equipment: None  Intra-op Plan:   Post-operative Plan:   Informed Consent: I have reviewed the patients History and Physical, chart, labs and discussed the procedure including the risks, benefits and alternatives for the proposed anesthesia with the patient or authorized representative who has indicated his/her understanding and acceptance.     Dental advisory given  Plan Discussed with: CRNA and Surgeon  Anesthesia Plan Comments: (Discussed risks of anesthesia with patient, including possibility of difficulty with spontaneous ventilation under anesthesia necessitating airway intervention, PONV, and rare risks such as cardiac or respiratory or neurological events, and allergic reactions. Discussed the role of CRNA in patient's perioperative care. Patient understands.)       Anesthesia Quick Evaluation

## 2023-12-05 NOTE — OR Nursing (Deleted)
 Mr. Frank Cabrera used the restroom and asked for RN to look at it. Output was yellow in color, but cloudy and the bottom of the toilet wasn't completely visualized.

## 2023-12-06 ENCOUNTER — Encounter: Payer: Self-pay | Admitting: Gastroenterology

## 2023-12-20 ENCOUNTER — Other Ambulatory Visit: Payer: Self-pay | Admitting: Cardiovascular Disease

## 2023-12-20 DIAGNOSIS — E785 Hyperlipidemia, unspecified: Secondary | ICD-10-CM

## 2024-02-23 ENCOUNTER — Ambulatory Visit: Payer: 59 | Admitting: Cardiovascular Disease

## 2024-03-02 ENCOUNTER — Encounter: Payer: Self-pay | Admitting: Cardiology

## 2024-03-02 ENCOUNTER — Ambulatory Visit: Attending: Cardiology | Admitting: Cardiology

## 2024-03-02 VITALS — BP 98/62 | HR 51 | Ht 66.0 in | Wt 154.5 lb

## 2024-03-02 DIAGNOSIS — G7000 Myasthenia gravis without (acute) exacerbation: Secondary | ICD-10-CM

## 2024-03-02 DIAGNOSIS — I25118 Atherosclerotic heart disease of native coronary artery with other forms of angina pectoris: Secondary | ICD-10-CM

## 2024-03-02 DIAGNOSIS — R0789 Other chest pain: Secondary | ICD-10-CM

## 2024-03-02 DIAGNOSIS — R001 Bradycardia, unspecified: Secondary | ICD-10-CM

## 2024-03-02 DIAGNOSIS — R931 Abnormal findings on diagnostic imaging of heart and coronary circulation: Secondary | ICD-10-CM | POA: Diagnosis not present

## 2024-03-02 DIAGNOSIS — Z79899 Other long term (current) drug therapy: Secondary | ICD-10-CM | POA: Diagnosis not present

## 2024-03-02 DIAGNOSIS — E785 Hyperlipidemia, unspecified: Secondary | ICD-10-CM

## 2024-03-02 NOTE — Progress Notes (Signed)
 Cardiology Office Note   Date:  03/02/2024  ID:  Cabrera, Frank 02/16/1966, MRN [id number] PCP: Thersia Flax, MD  [facility] Providers Cardiologist:  Antionette Kirks, MD     History of Present Illness Frank Cabrera is a 58 y.o. male with a past medical history of coronary artery disease (noted on cardiac CTA), myasthenia gravis, hyperlipidemia, gastroesophageal reflux disease, restless leg syndrome, and osteoporosis, who is here today for follow-up.  He was previously seen in clinic due to recurrent atypical chest pain.  He underwent an echocardiogram in February 2021 which showed normal LV systolic function, no evidence of pericardial effusion and no significant valvular abnormalities.  Coronary CTA in February 2021 showed a calcium  score of 73 which is in the 84th percentile for age and sex matched control.  Mild calcification in the proximal LAD causing mild stenosis.  He was last seen in clinic 11/09/2022 by Dr.Arida.  At that time he been doing well from a cardiac perspective without chest pain or shortness of breath.  He was continued on low-dose aspirin  and rosuvastatin .  He returns to clinic today stating that he had chest discomfort that awoke him from sleep at least twice in the last month.  He denies any other associated symptoms of shortness of breath, dyspnea on exertion, lightheadedness or dizziness.  He states that he has been compliant with his current medication regimen without any undue side effects.  Denies any hospitalizations or visits to the emergency department.  ROS: 10 point review of systems has been reviewed and considered negative except ones been listed in the HPI  Studies Reviewed EKG Interpretation Date/Time:  Friday Mar 02 2024 08:00:52 EDT Ventricular Rate:  51 PR Interval:  184 QRS Duration:  94 QT Interval:  440 QTC Calculation: 405 R Axis:   43  Text Interpretation: Sinus bradycardia When compared with ECG of 18-Sep-2020 08:01, No  significant change was found Confirmed by Frank Cabrera ([id number]) on 03/02/2024 8:03:34 AM    cCTA 01/24/2020 IMPRESSION: 1. Coronary calcium  score of 73.8. This was 84th percentile for age and sex matched control.   2. Normal coronary origin with right dominance.   3. Mild calcification in the proximal LAD causing mild stenosis.   4. CAD-RADS 2. Mild non-obstructive CAD (25-49%). Consider non-atherosclerotic causes of chest pain. Consider preventive therapy and risk factor modification.  2D echo 11/29/2019 1. Left ventricular ejection fraction, by estimation, is 55 to 60%. The  left ventricle has normal function. The left ventricle has no regional  wall motion abnormalities. Left ventricular diastolic parameters were  normal.   2. Right ventricular systolic function is normal. The right ventricular  size is normal. Tricuspid regurgitation signal is inadequate for assessing  PA pressure.   Risk Assessment/Calculations           Physical Exam VS:  BP 98/62 (BP Location: Left Arm, Patient Position: Sitting, Cuff Size: Normal)   Pulse (!) 51   Ht 5\' 6"  (1.676 m)   Wt 154 lb 8 oz (70.1 kg)   SpO2 99%   BMI 24.94 kg/m    Wt Readings from Last 3 Encounters:  03/02/24 154 lb 8 oz (70.1 kg)  12/05/23 152 lb 3.2 oz (69 kg)  11/04/23 156 lb 12.8 oz (71.1 kg)    GEN: Well nourished, well developed in no acute distress NECK: No JVD; No carotid bruits CARDIAC: RRR, bradycardic, no murmurs, rubs, gallops RESPIRATORY:  Clear to auscultation without rales, wheezing or rhonchi  ABDOMEN: Soft, non-tender, non-distended EXTREMITIES:  No edema; No deformity   ASSESSMENT AND PLAN Coronary artery disease with with previous coronary calcium  score of 73.8 which was 84th percentile for age and sex matched control, mild nonobstructive CAD with 25-49% in the proximal LAD (2021).  With having recurrent chest discomfort that is woken him from sleep that he states is hard to describe but is just a  discomfort that made him awake, on the left side of his chest then after several moments it finally subsided.  He has been scheduled for an updated coronary CTA to ensure there is no progression of his known coronary artery disease.  He has been continued on aspirin  81 mg daily rosuvastatin  5 mg daily.  EKG today reveals sinus bradycardia with a rate of 51 with no acute changes noted.  Mixed hyperlipidemia with LDL 67 which is at goal of less than 70.  He has been continued on rosuvastatin  5 mg daily.  Sinus bradycardia with a rate of 51.  Further review of prior EKGs reveals sinus bradycardia as well.  He has chronotropically appropriate and asymptomatic.  He continues to remain off of any AV nodal blocking agents.  Will continue to monitor with surveillance studies  Myasthenia gravis where symptoms are controlled with Imuran  and prednisone .       Dispo: Patient to return to clinic to see MD/APP in 3 months or sooner after testing is completed for reevaluation of symptoms or sooner if needed  Signed, Frank Dagher, NP

## 2024-03-02 NOTE — Patient Instructions (Signed)
 Medication Instructions:  Your Physician recommend you continue on your current medication as directed.    *If you need a refill on your cardiac medications before your next appointment, please call your pharmacy*  Lab Work: No labs ordered today  If you have labs (blood work) drawn today and your tests are completely normal, you will receive your results only by: MyChart Message (if you have MyChart) OR A paper copy in the mail If you have any lab test that is abnormal or we need to change your treatment, we will call you to review the results.  Testing/Procedures:   Your cardiac CT will be scheduled at one of the below locations:    [facility] [address] [place], [place] [address] [phone]  OR   [facility] [address] [place], [place] [address] [phone]   If scheduled at [facility] or [facility], please arrive 15 mins early for check-in and test prep.  There is spacious parking and easy access to the radiology department from the [facility] entrance. Please enter here and check-in with the desk attendant.   If scheduled at [facility], please arrive 30 minutes early for check-in and test prep.  Please follow these instructions carefully (unless otherwise directed):  An IV will be required for this test and Nitroglycerin  will be given.  Hold all erectile dysfunction medications at least 3 days (72 hrs) prior to test. (Ie viagra , cialis, sildenafil , tadalafil, etc)   On the Night Before the Test: Be sure to Drink plenty of water. Do not consume any caffeinated/decaffeinated beverages or chocolate 12 hours prior to your test. Do not take any antihistamines 12 hours prior to your test. If the patient has contrast allergy : Patient will need a prescription for Prednisone  and very clear instructions (as  follows): Prednisone  50 mg - take 13 hours prior to test Take another Prednisone  50 mg 7 hours prior to test Take another Prednisone  50 mg 1 hour prior to test Take Benadryl  50 mg 1 hour prior to test Patient must complete all four doses of above prophylactic medications. Patient will need a ride after test due to Benadryl .  On the Day of the Test: Drink plenty of water until 1 hour prior to the test. Do not eat any food 1 hour prior to test. You may take your regular medications prior to the test.  Take metoprolol (Lopressor) two hours prior to test. If you take Furosemide/Hydrochlorothiazide/Spironolactone/Chlorthalidone, please HOLD on the morning of the test. Patients who wear a continuous glucose monitor MUST remove the device prior to scanning. FEMALES- please wear underwire-free bra if available, avoid dresses & tight clothing       After the Test: Drink plenty of water. After receiving IV contrast, you may experience a mild flushed feeling. This is normal. On occasion, you may experience a mild rash up to 24 hours after the test. This is not dangerous. If this occurs, you can take Benadryl  25 mg, Zyrtec, Claritin, or Allegra and increase your fluid intake. (Patients taking Tikosyn should avoid Benadryl , and may take Zyrtec, Claritin, or Allegra) If you experience trouble breathing, this can be serious. If it is severe call 911 IMMEDIATELY. If it is mild, please call our office.  We will call to schedule your test 2-4 weeks out understanding that some insurance companies will need an authorization prior to the service being performed.   For more  information and frequently asked questions, please visit our website : [email or web address]  For non-scheduling related questions, please contact the cardiac imaging nurse navigator should you have any questions/concerns: Cardiac Imaging Nurse Navigators Direct Office Dial: [phone]   For scheduling needs, including  cancellations and rescheduling, please call [place], [phone].   Follow-Up: At [facility], you and your health needs are our priority.  As part of our continuing mission to provide you with exceptional heart care, our providers are all part of one team.  This team includes your primary Cardiologist (physician) and Advanced Practice Providers or APPs (Physician Assistants and Nurse Practitioners) who all work together to provide you with the care you need, when you need it.  Your next appointment:   3 month(s)  Provider:   Antionette Kirks, MD or Ronald Cockayne, NP    We recommend signing up for the patient portal called "MyChart".  Sign up information is provided on this After Visit Summary.  MyChart is used to connect with patients for Virtual Visits (Telemedicine).  Patients are able to view lab/test results, encounter notes, upcoming appointments, etc.  Non-urgent messages can be sent to your provider as well.   To learn more about what you can do with MyChart, go to [email or web address].

## 2024-03-03 LAB — BASIC METABOLIC PANEL WITH GFR
BUN/Creatinine Ratio: 15 (ref 9–20)
BUN: 13 mg/dL (ref 6–24)
CO2: 23 mmol/L (ref 20–29)
Calcium: 9.1 mg/dL (ref 8.7–10.2)
Chloride: 104 mmol/L (ref 96–106)
Creatinine, Ser: 0.89 mg/dL (ref 0.76–1.27)
Glucose: 83 mg/dL (ref 70–99)
Potassium: 4.7 mmol/L (ref 3.5–5.2)
Sodium: 143 mmol/L (ref 134–144)
eGFR: 101 mL/min/{1.73_m2} (ref >59)

## 2024-03-04 ENCOUNTER — Ambulatory Visit: Payer: Self-pay | Admitting: Cardiology

## 2024-03-04 NOTE — Progress Notes (Signed)
 Labs postprocedure have remained stable.  Continue current medication regimen without changes needed at this time.

## 2024-03-20 ENCOUNTER — Ambulatory Visit: Admitting: Cardiology

## 2024-03-28 ENCOUNTER — Other Ambulatory Visit: Payer: Self-pay | Admitting: Cardiovascular Disease

## 2024-03-28 DIAGNOSIS — E785 Hyperlipidemia, unspecified: Secondary | ICD-10-CM

## 2024-03-29 ENCOUNTER — Encounter (HOSPITAL_COMMUNITY): Payer: Self-pay

## 2024-04-02 ENCOUNTER — Ambulatory Visit
Admission: RE | Admit: 2024-04-02 | Discharge: 2024-04-02 | Disposition: A | Discharge status: Home / Self Care | Source: Ambulatory Visit | Attending: Cardiology | Admitting: Cardiology

## 2024-04-02 DIAGNOSIS — R0789 Other chest pain: Secondary | ICD-10-CM | POA: Insufficient documentation

## 2024-04-02 MED ORDER — IOHEXOL 350 MG/ML SOLN
80.0000 mL | Freq: Once | INTRAVENOUS | Status: AC | PRN
  Administered 2024-04-02: 80 mL via INTRAVENOUS

## 2024-04-02 MED ORDER — METOPROLOL TARTRATE 5 MG/5ML IV SOLN
10.0000 mg | INTRAVENOUS | Status: DC | PRN
  Filled 2024-04-02: qty 10

## 2024-04-02 MED ORDER — DILTIAZEM HCL 25 MG/5ML IV SOLN
10.0000 mg | INTRAVENOUS | Status: DC | PRN
  Filled 2024-04-02: qty 5

## 2024-04-02 MED ORDER — NITROGLYCERIN 0.4 MG SL SUBL
SUBLINGUAL_TABLET | SUBLINGUAL | Status: AC
  Filled 2024-04-02: qty 1

## 2024-04-02 MED ORDER — NITROGLYCERIN 0.4 MG SL SUBL
0.8000 mg | SUBLINGUAL_TABLET | Freq: Once | SUBLINGUAL | Status: AC
  Administered 2024-04-02: 0.4 mg via SUBLINGUAL
  Filled 2024-04-02: qty 25

## 2024-04-02 NOTE — Progress Notes (Signed)
 Patient tolerated procedure well. Ambulate w/o difficulty. Denies any lightheadedness or being dizzy. Pt denies any pain at this time. Sitting in chair. Pt is encouraged to drink additional water throughout the day and reason explained to patient. Patient verbalized understanding and all questions answered. ABC intact. No further needs at this time. Discharge from procedure area w/o issues.

## 2024-04-03 NOTE — Progress Notes (Signed)
 Coronary calcium  score 113 which is 81st percentile for age and sex matched controls.  Mild nonobstructive coronary artery disease.  The recommendation is consider Gollan is limited because of chest pain.  Consider preventative therapy and risk factor modification.  And CT portion of the study reveals no extracardiac incidental findings.  Continue aspirin  81 mg daily and previously prescribed rosuvastatin .  No medication changes needed at this time.

## 2024-04-27 ENCOUNTER — Encounter: Payer: Self-pay | Admitting: Internal Medicine

## 2024-05-07 ENCOUNTER — Encounter: Payer: Self-pay | Admitting: Internal Medicine

## 2024-05-07 ENCOUNTER — Ambulatory Visit (INDEPENDENT_AMBULATORY_CARE_PROVIDER_SITE_OTHER): Payer: Self-pay | Admitting: Internal Medicine

## 2024-05-07 VITALS — BP 100/72 | HR 62 | Temp 97.8°F | Ht 66.0 in | Wt 149.0 lb

## 2024-05-07 DIAGNOSIS — R1312 Dysphagia, oropharyngeal phase: Secondary | ICD-10-CM

## 2024-05-07 DIAGNOSIS — J069 Acute upper respiratory infection, unspecified: Secondary | ICD-10-CM

## 2024-05-07 DIAGNOSIS — R1310 Dysphagia, unspecified: Secondary | ICD-10-CM | POA: Insufficient documentation

## 2024-05-07 DIAGNOSIS — G7000 Myasthenia gravis without (acute) exacerbation: Secondary | ICD-10-CM

## 2024-05-07 DIAGNOSIS — D4989 Neoplasm of unspecified behavior of other specified sites: Secondary | ICD-10-CM

## 2024-05-07 NOTE — Assessment & Plan Note (Signed)
-   This problem is chronic -Patient symptoms are well-controlled on Imuran  and prednisone  -He will continue to follow with [facility] neurology

## 2024-05-07 NOTE — Progress Notes (Signed)
 Acute Office Visit  Subjective:     Patient ID: Frank Cabrera, male    DOB: 1966-04-06, 58 y.o.   MRN: [id number]  Chief Complaint  Patient presents with   Cough    Patient says he was having come chest congestion and has been ongoing for 4-5 months. Patient says he is actual feeling better. Patient says he was having some difficulty swallowing but has gotten better.     Cough Pertinent negatives include no shortness of breath or wheezing.   Patient is in today for chest congestion for the last couple of months.  Patient states that he has noted some chest congestion when he wakes up in the morning for the last few months and he occasionally has cough which is nonproductive in the morning.  He denies any fevers or chills.  No ear pain or discharge.  No sinus pain.  No headaches.  No postnasal drip.  No wheezing.  No shortness of breath.  Patient states that he used to feel a little lightheaded with exertion as well.  He states that his symptoms resolved approximately a week ago and has not recurred.  Patient states that he has been having some difficulty swallowing intermittently.  Patient states that he has no difficulty swallowing liquids but occasionally tries to eat drier foods like biscuits they feel like they get stuck at the back of his throat and he has to swallow multiple times to get it down.  He did discuss this with his neurologist but they did not recommend any further workup.  Review of Systems  Constitutional: Negative.   HENT: Negative.    Respiratory:  Positive for cough. Negative for shortness of breath and wheezing.   Cardiovascular: Negative.   Gastrointestinal:        Does complain of difficulty swallowing with drier foods like biscuits  Musculoskeletal: Negative.   Neurological: Negative.   Psychiatric/Behavioral: Negative.          Objective:    BP 100/72 (BP Location: Right Arm, Patient Position: Sitting, Cuff Size: Normal)   Pulse 62   Temp 97.8 F  (36.6 C) (Oral)   Ht 5' 6 (1.676 m)   Wt 149 lb (67.6 kg)   SpO2 98%   BMI 24.05 kg/m    Physical Exam Constitutional:      Appearance: Normal appearance.  HENT:     Head: Normocephalic and atraumatic.     Right Ear: Tympanic membrane, ear canal and external ear normal.     Left Ear: Tympanic membrane, ear canal and external ear normal.     Nose: Nose normal. No congestion or rhinorrhea.     Right Sinus: No maxillary sinus tenderness or frontal sinus tenderness.     Left Sinus: No maxillary sinus tenderness or frontal sinus tenderness.     Mouth/Throat:     Mouth: Mucous membranes are moist.     Pharynx: Oropharynx is clear. No oropharyngeal exudate or posterior oropharyngeal erythema.  Cardiovascular:     Rate and Rhythm: Normal rate and regular rhythm.     Heart sounds: Normal heart sounds.  Pulmonary:     Effort: Pulmonary effort is normal.     Breath sounds: Normal breath sounds. No wheezing, rhonchi or rales.  Musculoskeletal:     Cervical back: Neck supple.  Lymphadenopathy:     Cervical: No cervical adenopathy.  Neurological:     Mental Status: He is alert and oriented to person, place, and time.  Psychiatric:  Mood and Affect: Mood normal.        Behavior: Behavior normal.     No results found for any visits on 05/07/24.      Assessment & Plan:   Problem List Items Addressed This Visit       Respiratory   URI (upper respiratory infection)   - Patient complained of some chest congestion and cough for the last 2 months.  Patient states symptoms occur when he wakes up in the morning.  Denies any other URI symptoms including fevers, sinus pain, ear pain, headaches - On exam, lungs are clear to auscultation bilaterally.  No sinus tenderness to palpation.  No cervical lymphadenopathy noted. -Given that the patient had symptoms for 2 months I suspect he likely had a viral URI with a prolonged close viral bronchitis given his immunosuppressed state or more  likely he has seasonal allergies and this appears to have resolved currently -No further workup required at this time -Return precautions given to the patient        Digestive   Difficulty swallowing - Primary   - Patient states that he has difficulty intermittently swallowing food.  Denies any dysphagia to liquids.  Has intermittent difficulty with drier foods like biscuits and states that they get stuck in the back of his throat -Will refer to SLP for further evaluation and likely swallow study -Of note, patient does have a history of myasthenia but did discuss his symptoms with his neurologist who do not recommend further workup at this time      Relevant Orders   Ambulatory referral to Speech Therapy     Nervous and Auditory   Myasthenia gravis associated with thymoma (HCC)   - This problem is chronic -Patient symptoms are well-controlled on Imuran  and prednisone  -He will continue to follow with [facility] neurology       No orders of the defined types were placed in this encounter.   No follow-ups on file.  Sathvik Tiedt, MD

## 2024-05-07 NOTE — Assessment & Plan Note (Signed)
-   Patient complained of some chest congestion and cough for the last 2 months.  Patient states symptoms occur when he wakes up in the morning.  Denies any other URI symptoms including fevers, sinus pain, ear pain, headaches - On exam, lungs are clear to auscultation bilaterally.  No sinus tenderness to palpation.  No cervical lymphadenopathy noted. -Given that the patient had symptoms for 2 months I suspect he likely had a viral URI with a prolonged close viral bronchitis given his immunosuppressed state or more likely he has seasonal allergies and this appears to have resolved currently -No further workup required at this time -Return precautions given to the patient

## 2024-05-07 NOTE — Assessment & Plan Note (Signed)
-   Patient states that he has difficulty intermittently swallowing food.  Denies any dysphagia to liquids.  Has intermittent difficulty with drier foods like biscuits and states that they get stuck in the back of his throat -Will refer to SLP for further evaluation and likely swallow study -Of note, patient does have a history of myasthenia but did discuss his symptoms with his neurologist who do not recommend further workup at this time

## 2024-05-07 NOTE — Patient Instructions (Signed)
-   It was a pleasure meeting you today -I am glad that your chest congestion has improved and that you have no symptoms currently.  If your symptoms do recur please follow back up with us  so we can evaluate you again -As far as your difficulty swallowing goes we will order a swallow test for you. - Please contact us  you have any questions or concerns or if you need any refills

## 2024-05-14 ENCOUNTER — Ambulatory Visit: Admitting: Internal Medicine

## 2024-05-25 ENCOUNTER — Encounter: Payer: Self-pay | Admitting: Internal Medicine

## 2024-05-28 MED ORDER — PRAMIPEXOLE DIHYDROCHLORIDE 0.5 MG PO TABS: ORAL_TABLET | ORAL | 0 refills | Status: DC

## 2024-05-29 ENCOUNTER — Encounter: Payer: Self-pay | Admitting: Cardiovascular Disease

## 2024-05-29 ENCOUNTER — Ambulatory Visit: Attending: Cardiovascular Disease | Admitting: Cardiovascular Disease

## 2024-05-29 VITALS — BP 104/64 | HR 61 | Ht 66.0 in | Wt 146.2 lb

## 2024-05-29 DIAGNOSIS — E785 Hyperlipidemia, unspecified: Secondary | ICD-10-CM | POA: Diagnosis not present

## 2024-05-29 DIAGNOSIS — R931 Abnormal findings on diagnostic imaging of heart and coronary circulation: Secondary | ICD-10-CM

## 2024-05-29 NOTE — Patient Instructions (Signed)

## 2024-05-29 NOTE — Progress Notes (Signed)
 Cardiology Office Note   Date:  05/29/2024   ID:  Frank Cabrera 07/04/1966, MRN [id number]  PCP:  Marylynn Verneita CROME, MD  Cardiologist:   Deatrice Cage, MD   Chief Complaint  Patient presents with   Follow-up    F/u CT no complaints today. Meds reviewed verbally with pt.      History of Present Illness: Frank Cabrera is a 58 y.o. male who is here today for follow-up visit regarding atypical chest pain and mild coronary atherosclerosis noted on cardiac CTA.   He has known history of myasthenia gravis currently being treated with Imuran  and prednisone . He has been on steroids for many years. He is not a smoker. There is family history of atrial fibrillation but no coronary artery disease.  Echocardiogram in 2021 showed normal LV systolic function,no evidence of pericardial effusion and no significant valvular abnormalities.   CTA of the coronary arteries in February 2021 showed a calcium  score of 73 which was in the 71 percentile for age and sex matched group control.  There was mild calcification in the proximal LAD causing mild stenosis.    He was seen in our office in May with atypical chest pain.  Cardiac CTA was repeated which showed mildly elevated calcium  score at 113 with mild nonobstructive disease involving the LAD.  He feels better overall.  Past Medical History:  Diagnosis Date   CAD (coronary artery disease)    GERD (gastroesophageal reflux disease)    Herpes simplex type II infection 2003   History of 2019 novel coronavirus disease (COVID-19) 03/09/2019   HLD (hyperlipidemia)    Long term current use of immunosuppressive drug    Myasthenia gravis (HCC)    Myasthenia gravis associated with thymoma (HCC)    Osteoporosis    Pansinusitis    PONV (postoperative nausea and vomiting)    patient also stated that toes and fingers numbness after the third day of surgery   Restless leg syndrome     Past Surgical History:  Procedure Laterality Date   COLONOSCOPY  WITH PROPOFOL  N/A 10/21/2023   Procedure: COLONOSCOPY WITH PROPOFOL ;  Surgeon: Therisa Bi, MD;  Location: [facility] ENDOSCOPY;  Service: Gastroenterology;  Laterality: N/A;   COLONOSCOPY WITH PROPOFOL  N/A 12/05/2023   Procedure: COLONOSCOPY WITH PROPOFOL ;  Surgeon: Therisa Bi, MD;  Location: [facility] ENDOSCOPY;  Service: Gastroenterology;  Laterality: N/A;   EYE MUSCLE SURGERY  2008   for strabismus, [place],  Board   HAND TENDON SURGERY Left 2021   JOINT REPLACEMENT Right 09/2020   hip replacement    SEPTOPLASTY  March 2013   STRABISMUS SURGERY  2006   TOTAL HIP ARTHROPLASTY Right 09/24/2020   Procedure: TOTAL HIP ARTHROPLASTY;  Surgeon: Mardee Lynwood SQUIBB, MD;  Location: [facility] ORS;  Service: Orthopedics;  Laterality: Right;   TOTAL THYMECTOMY  1995   XI ROBOTIC ASSISTED INGUINAL HERNIA REPAIR WITH MESH Right 11/06/2020   Procedure: XI ROBOTIC ASSISTED INGUINAL HERNIA REPAIR WITH MESH;  Surgeon: Desiderio Schanz, MD;  Location: [facility] ORS;  Service: General;  Laterality: Right;     Current Outpatient Medications  Medication Sig Dispense Refill   alendronate  (FOSAMAX ) 35 MG tablet Take with a full glass of water on an empty stomach.TAKE 1 TABLET BY MOUTH ONE TIME PER WEEK 12 tablet 1   aspirin  EC 81 MG tablet Take 81 mg by mouth daily. Swallow whole.     azaTHIOprine  (IMURAN ) 50 MG tablet Take 150 mg by mouth daily.  CALCIUM  PO Take 500 mg by mouth daily before breakfast.      Flaxseed, Linseed, (FLAXSEED OIL) 1000 MG CAPS Take 1,000 mg by mouth daily.     folic acid  (FOLVITE ) 1 MG tablet Take 1 mg by mouth daily.     pramipexole  (MIRAPEX ) 0.5 MG tablet TAKE 2 TABLETS (1 MG TOTAL) BY MOUTH EVERY DAY AT BEDTIME 180 tablet 0   predniSONE  (DELTASONE ) 10 MG tablet Take 17.5 mg by mouth every other day.     rosuvastatin  (CRESTOR ) 5 MG tablet TAKE 1 TABLET (5 MG TOTAL) BY MOUTH DAILY. 90 tablet 0   tamsulosin (FLOMAX) 0.4 MG CAPS capsule Take 1 capsule by mouth daily.     No current facility-administered  medications for this visit.    Allergies:   Cefadroxil    Social History:  The patient  reports that he has never smoked. He has never used smokeless tobacco. He reports that he does not drink alcohol and does not use drugs.   Family History:  The patient's family history includes Arrhythmia in his father; Cancer in his maternal grandmother and maternal uncle; Heart disease in his father and mother.    ROS:  Please see the history of present illness.   Otherwise, review of systems are positive for none.   All other systems are reviewed and negative.    PHYSICAL EXAM: VS:  BP 104/64 (BP Location: Left Arm, Patient Position: Sitting, Cuff Size: Normal)   Pulse 61   Ht 5' 6 (1.676 m)   Wt 146 lb 4 oz (66.3 kg)   SpO2 96%   BMI 23.61 kg/m  , BMI Body mass index is 23.61 kg/m. GEN: Well nourished, well developed, in no acute distress  HEENT: normal  Neck: no JVD, carotid bruits, or masses Cardiac: RRR; no murmurs, rubs, or gallops,no edema  Respiratory:  clear to auscultation bilaterally, normal work of breathing GI: soft, nontender, nondistended, + BS MS: no deformity or atrophy  Skin: warm and dry, no rash Neuro:  Strength and sensation are intact Psych: euthymic mood, full affect    EKG:  EKG is not ordered today.     Recent Labs: 11/21/2023: ALT 13; Hemoglobin 13.6; Platelets 328; TSH 1.17 03/02/2024: BUN 13; Creatinine, Ser 0.89; Potassium 4.7; Sodium 143    Lipid Panel    Component Value Date/Time   CHOL 152 11/21/2023 1109   CHOL 189 10/01/2015 1025   TRIG 72 11/21/2023 1109   HDL 70 11/21/2023 1109   HDL 70 10/01/2015 1025   CHOLHDL 2.2 11/21/2023 1109   VLDL 10.2 07/02/2022 0748   LDLCALC 67 11/21/2023 1109   LDLDIRECT 69 11/21/2023 1109      Wt Readings from Last 3 Encounters:  05/29/24 146 lb 4 oz (66.3 kg)  05/07/24 149 lb (67.6 kg)  03/02/24 154 lb 8 oz (70.1 kg)       ASSESSMENT AND PLAN:  1.  Elevated coronary calcium  score: Mildly  elevated calcium  score but no evidence of obstructive disease.  Continue treatment of risk factors and healthy lifestyle changes.  Continue low-dose aspirin . I reviewed the CTA images with him today.  2.   Hyperlipidemia: Continue treatment with rosuvastatin .  I reviewed most recent lipid profile which showed an LDL of 69.  3.  Atypical feet discomfort: ABI in 2024 was normal.  4. Myasthenia gravis: Symptoms are controlled with Imuran  and prednisone .    Disposition:   FU with me in 1 year  Signed,  Deatrice Cage,  MD  05/29/2024 1:34 PM    [facility]

## 2024-08-11 ENCOUNTER — Other Ambulatory Visit: Payer: Self-pay | Admitting: Cardiovascular Disease

## 2024-08-11 DIAGNOSIS — E785 Hyperlipidemia, unspecified: Secondary | ICD-10-CM

## 2024-08-22 ENCOUNTER — Other Ambulatory Visit: Payer: Self-pay | Admitting: Internal Medicine

## 2024-09-02 ENCOUNTER — Encounter: Payer: Self-pay | Admitting: Internal Medicine

## 2024-09-03 MED ORDER — PRAMIPEXOLE DIHYDROCHLORIDE 0.5 MG PO TABS: ORAL_TABLET | ORAL | 1 refills | Status: DC

## 2024-09-03 NOTE — Telephone Encounter (Signed)
 Noted

## 2024-09-25 ENCOUNTER — Other Ambulatory Visit: Payer: Self-pay | Admitting: Internal Medicine

## 2024-11-16 ENCOUNTER — Encounter: Admitting: Internal Medicine

## 2024-11-20 ENCOUNTER — Encounter: Admitting: Internal Medicine
